# Patient Record
Sex: Male | Born: 1965 | Race: Black or African American | Hispanic: No | Marital: Married | Smoking: Never smoker
Health system: Southern US, Community
[De-identification: ages and names within clinical notes are randomized; demographics above are authoritative.]

## PROBLEM LIST (undated history)

## (undated) DIAGNOSIS — I739 Peripheral vascular disease, unspecified: Secondary | ICD-10-CM

## (undated) DIAGNOSIS — IMO0001 Reserved for inherently not codable concepts without codable children: Secondary | ICD-10-CM

## (undated) DIAGNOSIS — I499 Cardiac arrhythmia, unspecified: Secondary | ICD-10-CM

## (undated) DIAGNOSIS — F32A Depression, unspecified: Secondary | ICD-10-CM

## (undated) DIAGNOSIS — D649 Anemia, unspecified: Secondary | ICD-10-CM

## (undated) DIAGNOSIS — E039 Hypothyroidism, unspecified: Secondary | ICD-10-CM

## (undated) DIAGNOSIS — K429 Umbilical hernia without obstruction or gangrene: Secondary | ICD-10-CM

## (undated) DIAGNOSIS — Z992 Dependence on renal dialysis: Secondary | ICD-10-CM

## (undated) DIAGNOSIS — F329 Major depressive disorder, single episode, unspecified: Secondary | ICD-10-CM

## (undated) DIAGNOSIS — G4730 Sleep apnea, unspecified: Secondary | ICD-10-CM

## (undated) DIAGNOSIS — I10 Essential (primary) hypertension: Secondary | ICD-10-CM

## (undated) DIAGNOSIS — N289 Disorder of kidney and ureter, unspecified: Secondary | ICD-10-CM

## (undated) DIAGNOSIS — J45909 Unspecified asthma, uncomplicated: Secondary | ICD-10-CM

## (undated) DIAGNOSIS — G629 Polyneuropathy, unspecified: Secondary | ICD-10-CM

## (undated) DIAGNOSIS — I219 Acute myocardial infarction, unspecified: Secondary | ICD-10-CM

## (undated) HISTORY — PX: HERNIA REPAIR: SHX51

## (undated) HISTORY — PX: INSERTION OF DIALYSIS CATHETER: SHX1324

---

## 2014-12-06 DIAGNOSIS — N2581 Secondary hyperparathyroidism of renal origin: Secondary | ICD-10-CM | POA: Diagnosis not present

## 2014-12-06 DIAGNOSIS — N186 End stage renal disease: Secondary | ICD-10-CM | POA: Diagnosis not present

## 2014-12-06 DIAGNOSIS — D509 Iron deficiency anemia, unspecified: Secondary | ICD-10-CM | POA: Diagnosis not present

## 2014-12-06 DIAGNOSIS — Z992 Dependence on renal dialysis: Secondary | ICD-10-CM | POA: Diagnosis not present

## 2014-12-07 DIAGNOSIS — N2581 Secondary hyperparathyroidism of renal origin: Secondary | ICD-10-CM | POA: Diagnosis not present

## 2014-12-07 DIAGNOSIS — N186 End stage renal disease: Secondary | ICD-10-CM | POA: Diagnosis not present

## 2014-12-07 DIAGNOSIS — Z992 Dependence on renal dialysis: Secondary | ICD-10-CM | POA: Diagnosis not present

## 2014-12-07 DIAGNOSIS — D509 Iron deficiency anemia, unspecified: Secondary | ICD-10-CM | POA: Diagnosis not present

## 2014-12-08 DIAGNOSIS — Z992 Dependence on renal dialysis: Secondary | ICD-10-CM | POA: Diagnosis not present

## 2014-12-08 DIAGNOSIS — N186 End stage renal disease: Secondary | ICD-10-CM | POA: Diagnosis not present

## 2014-12-08 DIAGNOSIS — N2581 Secondary hyperparathyroidism of renal origin: Secondary | ICD-10-CM | POA: Diagnosis not present

## 2014-12-08 DIAGNOSIS — D509 Iron deficiency anemia, unspecified: Secondary | ICD-10-CM | POA: Diagnosis not present

## 2014-12-09 DIAGNOSIS — N2581 Secondary hyperparathyroidism of renal origin: Secondary | ICD-10-CM | POA: Diagnosis not present

## 2014-12-09 DIAGNOSIS — Z992 Dependence on renal dialysis: Secondary | ICD-10-CM | POA: Diagnosis not present

## 2014-12-09 DIAGNOSIS — N186 End stage renal disease: Secondary | ICD-10-CM | POA: Diagnosis not present

## 2014-12-09 DIAGNOSIS — D509 Iron deficiency anemia, unspecified: Secondary | ICD-10-CM | POA: Diagnosis not present

## 2014-12-10 DIAGNOSIS — N2581 Secondary hyperparathyroidism of renal origin: Secondary | ICD-10-CM | POA: Diagnosis not present

## 2014-12-10 DIAGNOSIS — Z992 Dependence on renal dialysis: Secondary | ICD-10-CM | POA: Diagnosis not present

## 2014-12-10 DIAGNOSIS — D509 Iron deficiency anemia, unspecified: Secondary | ICD-10-CM | POA: Diagnosis not present

## 2014-12-10 DIAGNOSIS — N186 End stage renal disease: Secondary | ICD-10-CM | POA: Diagnosis not present

## 2014-12-11 DIAGNOSIS — N2581 Secondary hyperparathyroidism of renal origin: Secondary | ICD-10-CM | POA: Diagnosis not present

## 2014-12-11 DIAGNOSIS — Z992 Dependence on renal dialysis: Secondary | ICD-10-CM | POA: Diagnosis not present

## 2014-12-11 DIAGNOSIS — D509 Iron deficiency anemia, unspecified: Secondary | ICD-10-CM | POA: Diagnosis not present

## 2014-12-11 DIAGNOSIS — N186 End stage renal disease: Secondary | ICD-10-CM | POA: Diagnosis not present

## 2014-12-12 DIAGNOSIS — Z992 Dependence on renal dialysis: Secondary | ICD-10-CM | POA: Diagnosis not present

## 2014-12-12 DIAGNOSIS — N186 End stage renal disease: Secondary | ICD-10-CM | POA: Diagnosis not present

## 2014-12-12 DIAGNOSIS — N2581 Secondary hyperparathyroidism of renal origin: Secondary | ICD-10-CM | POA: Diagnosis not present

## 2014-12-12 DIAGNOSIS — D509 Iron deficiency anemia, unspecified: Secondary | ICD-10-CM | POA: Diagnosis not present

## 2014-12-13 DIAGNOSIS — N2581 Secondary hyperparathyroidism of renal origin: Secondary | ICD-10-CM | POA: Diagnosis not present

## 2014-12-13 DIAGNOSIS — D509 Iron deficiency anemia, unspecified: Secondary | ICD-10-CM | POA: Diagnosis not present

## 2014-12-13 DIAGNOSIS — Z992 Dependence on renal dialysis: Secondary | ICD-10-CM | POA: Diagnosis not present

## 2014-12-13 DIAGNOSIS — N186 End stage renal disease: Secondary | ICD-10-CM | POA: Diagnosis not present

## 2014-12-14 DIAGNOSIS — N186 End stage renal disease: Secondary | ICD-10-CM | POA: Diagnosis not present

## 2014-12-14 DIAGNOSIS — D509 Iron deficiency anemia, unspecified: Secondary | ICD-10-CM | POA: Diagnosis not present

## 2014-12-14 DIAGNOSIS — N2581 Secondary hyperparathyroidism of renal origin: Secondary | ICD-10-CM | POA: Diagnosis not present

## 2014-12-14 DIAGNOSIS — Z992 Dependence on renal dialysis: Secondary | ICD-10-CM | POA: Diagnosis not present

## 2014-12-15 DIAGNOSIS — N186 End stage renal disease: Secondary | ICD-10-CM | POA: Diagnosis not present

## 2014-12-15 DIAGNOSIS — Z992 Dependence on renal dialysis: Secondary | ICD-10-CM | POA: Diagnosis not present

## 2014-12-15 DIAGNOSIS — N2581 Secondary hyperparathyroidism of renal origin: Secondary | ICD-10-CM | POA: Diagnosis not present

## 2014-12-15 DIAGNOSIS — D509 Iron deficiency anemia, unspecified: Secondary | ICD-10-CM | POA: Diagnosis not present

## 2014-12-16 DIAGNOSIS — Z992 Dependence on renal dialysis: Secondary | ICD-10-CM | POA: Diagnosis not present

## 2014-12-16 DIAGNOSIS — N186 End stage renal disease: Secondary | ICD-10-CM | POA: Diagnosis not present

## 2014-12-16 DIAGNOSIS — N2581 Secondary hyperparathyroidism of renal origin: Secondary | ICD-10-CM | POA: Diagnosis not present

## 2014-12-16 DIAGNOSIS — D509 Iron deficiency anemia, unspecified: Secondary | ICD-10-CM | POA: Diagnosis not present

## 2014-12-17 DIAGNOSIS — N2581 Secondary hyperparathyroidism of renal origin: Secondary | ICD-10-CM | POA: Diagnosis not present

## 2014-12-17 DIAGNOSIS — N186 End stage renal disease: Secondary | ICD-10-CM | POA: Diagnosis not present

## 2014-12-17 DIAGNOSIS — D509 Iron deficiency anemia, unspecified: Secondary | ICD-10-CM | POA: Diagnosis not present

## 2014-12-17 DIAGNOSIS — Z992 Dependence on renal dialysis: Secondary | ICD-10-CM | POA: Diagnosis not present

## 2014-12-18 DIAGNOSIS — N2581 Secondary hyperparathyroidism of renal origin: Secondary | ICD-10-CM | POA: Diagnosis not present

## 2014-12-18 DIAGNOSIS — Z992 Dependence on renal dialysis: Secondary | ICD-10-CM | POA: Diagnosis not present

## 2014-12-18 DIAGNOSIS — N186 End stage renal disease: Secondary | ICD-10-CM | POA: Diagnosis not present

## 2014-12-18 DIAGNOSIS — D509 Iron deficiency anemia, unspecified: Secondary | ICD-10-CM | POA: Diagnosis not present

## 2014-12-19 DIAGNOSIS — N186 End stage renal disease: Secondary | ICD-10-CM | POA: Diagnosis not present

## 2014-12-19 DIAGNOSIS — N2581 Secondary hyperparathyroidism of renal origin: Secondary | ICD-10-CM | POA: Diagnosis not present

## 2014-12-19 DIAGNOSIS — D509 Iron deficiency anemia, unspecified: Secondary | ICD-10-CM | POA: Diagnosis not present

## 2014-12-19 DIAGNOSIS — Z992 Dependence on renal dialysis: Secondary | ICD-10-CM | POA: Diagnosis not present

## 2014-12-20 DIAGNOSIS — Z992 Dependence on renal dialysis: Secondary | ICD-10-CM | POA: Diagnosis not present

## 2014-12-20 DIAGNOSIS — D509 Iron deficiency anemia, unspecified: Secondary | ICD-10-CM | POA: Diagnosis not present

## 2014-12-20 DIAGNOSIS — N186 End stage renal disease: Secondary | ICD-10-CM | POA: Diagnosis not present

## 2014-12-20 DIAGNOSIS — N2581 Secondary hyperparathyroidism of renal origin: Secondary | ICD-10-CM | POA: Diagnosis not present

## 2014-12-21 DIAGNOSIS — N186 End stage renal disease: Secondary | ICD-10-CM | POA: Diagnosis not present

## 2014-12-21 DIAGNOSIS — D509 Iron deficiency anemia, unspecified: Secondary | ICD-10-CM | POA: Diagnosis not present

## 2014-12-21 DIAGNOSIS — Z992 Dependence on renal dialysis: Secondary | ICD-10-CM | POA: Diagnosis not present

## 2014-12-21 DIAGNOSIS — N2581 Secondary hyperparathyroidism of renal origin: Secondary | ICD-10-CM | POA: Diagnosis not present

## 2014-12-22 DIAGNOSIS — Z992 Dependence on renal dialysis: Secondary | ICD-10-CM | POA: Diagnosis not present

## 2014-12-22 DIAGNOSIS — N186 End stage renal disease: Secondary | ICD-10-CM | POA: Diagnosis not present

## 2014-12-22 DIAGNOSIS — D509 Iron deficiency anemia, unspecified: Secondary | ICD-10-CM | POA: Diagnosis not present

## 2014-12-22 DIAGNOSIS — N2581 Secondary hyperparathyroidism of renal origin: Secondary | ICD-10-CM | POA: Diagnosis not present

## 2014-12-23 DIAGNOSIS — Z992 Dependence on renal dialysis: Secondary | ICD-10-CM | POA: Diagnosis not present

## 2014-12-23 DIAGNOSIS — N2581 Secondary hyperparathyroidism of renal origin: Secondary | ICD-10-CM | POA: Diagnosis not present

## 2014-12-23 DIAGNOSIS — D509 Iron deficiency anemia, unspecified: Secondary | ICD-10-CM | POA: Diagnosis not present

## 2014-12-23 DIAGNOSIS — N186 End stage renal disease: Secondary | ICD-10-CM | POA: Diagnosis not present

## 2014-12-24 DIAGNOSIS — Z992 Dependence on renal dialysis: Secondary | ICD-10-CM | POA: Diagnosis not present

## 2014-12-24 DIAGNOSIS — D509 Iron deficiency anemia, unspecified: Secondary | ICD-10-CM | POA: Diagnosis not present

## 2014-12-24 DIAGNOSIS — N2581 Secondary hyperparathyroidism of renal origin: Secondary | ICD-10-CM | POA: Diagnosis not present

## 2014-12-24 DIAGNOSIS — N186 End stage renal disease: Secondary | ICD-10-CM | POA: Diagnosis not present

## 2014-12-25 DIAGNOSIS — D509 Iron deficiency anemia, unspecified: Secondary | ICD-10-CM | POA: Diagnosis not present

## 2014-12-25 DIAGNOSIS — N2581 Secondary hyperparathyroidism of renal origin: Secondary | ICD-10-CM | POA: Diagnosis not present

## 2014-12-25 DIAGNOSIS — Z992 Dependence on renal dialysis: Secondary | ICD-10-CM | POA: Diagnosis not present

## 2014-12-25 DIAGNOSIS — N186 End stage renal disease: Secondary | ICD-10-CM | POA: Diagnosis not present

## 2015-01-02 DIAGNOSIS — N2581 Secondary hyperparathyroidism of renal origin: Secondary | ICD-10-CM | POA: Diagnosis not present

## 2015-01-02 DIAGNOSIS — Z992 Dependence on renal dialysis: Secondary | ICD-10-CM | POA: Diagnosis not present

## 2015-01-02 DIAGNOSIS — D509 Iron deficiency anemia, unspecified: Secondary | ICD-10-CM | POA: Diagnosis not present

## 2015-01-02 DIAGNOSIS — N186 End stage renal disease: Secondary | ICD-10-CM | POA: Diagnosis not present

## 2015-01-03 DIAGNOSIS — N2581 Secondary hyperparathyroidism of renal origin: Secondary | ICD-10-CM | POA: Diagnosis not present

## 2015-01-03 DIAGNOSIS — D509 Iron deficiency anemia, unspecified: Secondary | ICD-10-CM | POA: Diagnosis not present

## 2015-01-03 DIAGNOSIS — N186 End stage renal disease: Secondary | ICD-10-CM | POA: Diagnosis not present

## 2015-01-03 DIAGNOSIS — Z992 Dependence on renal dialysis: Secondary | ICD-10-CM | POA: Diagnosis not present

## 2015-01-04 DIAGNOSIS — N2581 Secondary hyperparathyroidism of renal origin: Secondary | ICD-10-CM | POA: Diagnosis not present

## 2015-01-04 DIAGNOSIS — D509 Iron deficiency anemia, unspecified: Secondary | ICD-10-CM | POA: Diagnosis not present

## 2015-01-04 DIAGNOSIS — N186 End stage renal disease: Secondary | ICD-10-CM | POA: Diagnosis not present

## 2015-01-04 DIAGNOSIS — Z992 Dependence on renal dialysis: Secondary | ICD-10-CM | POA: Diagnosis not present

## 2015-01-05 DIAGNOSIS — N2581 Secondary hyperparathyroidism of renal origin: Secondary | ICD-10-CM | POA: Diagnosis not present

## 2015-01-05 DIAGNOSIS — Z992 Dependence on renal dialysis: Secondary | ICD-10-CM | POA: Diagnosis not present

## 2015-01-05 DIAGNOSIS — N186 End stage renal disease: Secondary | ICD-10-CM | POA: Diagnosis not present

## 2015-01-05 DIAGNOSIS — D509 Iron deficiency anemia, unspecified: Secondary | ICD-10-CM | POA: Diagnosis not present

## 2015-01-06 DIAGNOSIS — N2581 Secondary hyperparathyroidism of renal origin: Secondary | ICD-10-CM | POA: Diagnosis not present

## 2015-01-06 DIAGNOSIS — Z992 Dependence on renal dialysis: Secondary | ICD-10-CM | POA: Diagnosis not present

## 2015-01-06 DIAGNOSIS — N186 End stage renal disease: Secondary | ICD-10-CM | POA: Diagnosis not present

## 2015-01-06 DIAGNOSIS — D509 Iron deficiency anemia, unspecified: Secondary | ICD-10-CM | POA: Diagnosis not present

## 2015-01-07 DIAGNOSIS — D509 Iron deficiency anemia, unspecified: Secondary | ICD-10-CM | POA: Diagnosis not present

## 2015-01-07 DIAGNOSIS — Z992 Dependence on renal dialysis: Secondary | ICD-10-CM | POA: Diagnosis not present

## 2015-01-07 DIAGNOSIS — N2581 Secondary hyperparathyroidism of renal origin: Secondary | ICD-10-CM | POA: Diagnosis not present

## 2015-01-07 DIAGNOSIS — N186 End stage renal disease: Secondary | ICD-10-CM | POA: Diagnosis not present

## 2015-01-08 DIAGNOSIS — I5022 Chronic systolic (congestive) heart failure: Secondary | ICD-10-CM | POA: Diagnosis present

## 2015-01-08 DIAGNOSIS — R0602 Shortness of breath: Secondary | ICD-10-CM | POA: Diagnosis not present

## 2015-01-08 DIAGNOSIS — J449 Chronic obstructive pulmonary disease, unspecified: Secondary | ICD-10-CM | POA: Diagnosis present

## 2015-01-08 DIAGNOSIS — R0789 Other chest pain: Secondary | ICD-10-CM | POA: Diagnosis not present

## 2015-01-08 DIAGNOSIS — E875 Hyperkalemia: Secondary | ICD-10-CM | POA: Diagnosis present

## 2015-01-08 DIAGNOSIS — R7989 Other specified abnormal findings of blood chemistry: Secondary | ICD-10-CM | POA: Diagnosis present

## 2015-01-08 DIAGNOSIS — Z7982 Long term (current) use of aspirin: Secondary | ICD-10-CM | POA: Diagnosis not present

## 2015-01-08 DIAGNOSIS — R000 Tachycardia, unspecified: Secondary | ICD-10-CM | POA: Diagnosis not present

## 2015-01-08 DIAGNOSIS — E785 Hyperlipidemia, unspecified: Secondary | ICD-10-CM | POA: Diagnosis present

## 2015-01-08 DIAGNOSIS — F329 Major depressive disorder, single episode, unspecified: Secondary | ICD-10-CM | POA: Diagnosis present

## 2015-01-08 DIAGNOSIS — I120 Hypertensive chronic kidney disease with stage 5 chronic kidney disease or end stage renal disease: Secondary | ICD-10-CM | POA: Diagnosis not present

## 2015-01-08 DIAGNOSIS — J9601 Acute respiratory failure with hypoxia: Secondary | ICD-10-CM | POA: Diagnosis not present

## 2015-01-08 DIAGNOSIS — R079 Chest pain, unspecified: Secondary | ICD-10-CM | POA: Diagnosis not present

## 2015-01-08 DIAGNOSIS — E8779 Other fluid overload: Secondary | ICD-10-CM | POA: Diagnosis not present

## 2015-01-08 DIAGNOSIS — Z992 Dependence on renal dialysis: Secondary | ICD-10-CM | POA: Diagnosis not present

## 2015-01-08 DIAGNOSIS — N186 End stage renal disease: Secondary | ICD-10-CM | POA: Diagnosis not present

## 2015-01-08 DIAGNOSIS — Z9115 Patient's noncompliance with renal dialysis: Secondary | ICD-10-CM | POA: Diagnosis not present

## 2015-01-08 DIAGNOSIS — D638 Anemia in other chronic diseases classified elsewhere: Secondary | ICD-10-CM | POA: Diagnosis not present

## 2015-01-08 DIAGNOSIS — R1084 Generalized abdominal pain: Secondary | ICD-10-CM | POA: Diagnosis present

## 2015-01-08 DIAGNOSIS — J90 Pleural effusion, not elsewhere classified: Secondary | ICD-10-CM | POA: Diagnosis not present

## 2015-01-08 DIAGNOSIS — J811 Chronic pulmonary edema: Secondary | ICD-10-CM | POA: Diagnosis not present

## 2015-01-08 DIAGNOSIS — I2510 Atherosclerotic heart disease of native coronary artery without angina pectoris: Secondary | ICD-10-CM | POA: Diagnosis present

## 2015-01-08 DIAGNOSIS — I517 Cardiomegaly: Secondary | ICD-10-CM | POA: Diagnosis not present

## 2015-01-11 DIAGNOSIS — N2581 Secondary hyperparathyroidism of renal origin: Secondary | ICD-10-CM | POA: Diagnosis not present

## 2015-01-11 DIAGNOSIS — Z992 Dependence on renal dialysis: Secondary | ICD-10-CM | POA: Diagnosis not present

## 2015-01-11 DIAGNOSIS — N186 End stage renal disease: Secondary | ICD-10-CM | POA: Diagnosis not present

## 2015-01-11 DIAGNOSIS — D509 Iron deficiency anemia, unspecified: Secondary | ICD-10-CM | POA: Diagnosis not present

## 2015-01-12 DIAGNOSIS — D509 Iron deficiency anemia, unspecified: Secondary | ICD-10-CM | POA: Diagnosis not present

## 2015-01-12 DIAGNOSIS — N2581 Secondary hyperparathyroidism of renal origin: Secondary | ICD-10-CM | POA: Diagnosis not present

## 2015-01-12 DIAGNOSIS — N186 End stage renal disease: Secondary | ICD-10-CM | POA: Diagnosis not present

## 2015-01-12 DIAGNOSIS — Z992 Dependence on renal dialysis: Secondary | ICD-10-CM | POA: Diagnosis not present

## 2015-01-13 DIAGNOSIS — Z992 Dependence on renal dialysis: Secondary | ICD-10-CM | POA: Diagnosis not present

## 2015-01-13 DIAGNOSIS — N2581 Secondary hyperparathyroidism of renal origin: Secondary | ICD-10-CM | POA: Diagnosis not present

## 2015-01-13 DIAGNOSIS — N186 End stage renal disease: Secondary | ICD-10-CM | POA: Diagnosis not present

## 2015-01-13 DIAGNOSIS — D509 Iron deficiency anemia, unspecified: Secondary | ICD-10-CM | POA: Diagnosis not present

## 2015-01-14 DIAGNOSIS — D509 Iron deficiency anemia, unspecified: Secondary | ICD-10-CM | POA: Diagnosis not present

## 2015-01-14 DIAGNOSIS — N186 End stage renal disease: Secondary | ICD-10-CM | POA: Diagnosis not present

## 2015-01-14 DIAGNOSIS — N2581 Secondary hyperparathyroidism of renal origin: Secondary | ICD-10-CM | POA: Diagnosis not present

## 2015-01-14 DIAGNOSIS — Z992 Dependence on renal dialysis: Secondary | ICD-10-CM | POA: Diagnosis not present

## 2015-01-15 DIAGNOSIS — Z992 Dependence on renal dialysis: Secondary | ICD-10-CM | POA: Diagnosis not present

## 2015-01-15 DIAGNOSIS — N186 End stage renal disease: Secondary | ICD-10-CM | POA: Diagnosis not present

## 2015-01-15 DIAGNOSIS — N2581 Secondary hyperparathyroidism of renal origin: Secondary | ICD-10-CM | POA: Diagnosis not present

## 2015-01-15 DIAGNOSIS — D509 Iron deficiency anemia, unspecified: Secondary | ICD-10-CM | POA: Diagnosis not present

## 2015-01-16 DIAGNOSIS — N2581 Secondary hyperparathyroidism of renal origin: Secondary | ICD-10-CM | POA: Diagnosis not present

## 2015-01-16 DIAGNOSIS — D509 Iron deficiency anemia, unspecified: Secondary | ICD-10-CM | POA: Diagnosis not present

## 2015-01-16 DIAGNOSIS — N186 End stage renal disease: Secondary | ICD-10-CM | POA: Diagnosis not present

## 2015-01-16 DIAGNOSIS — Z992 Dependence on renal dialysis: Secondary | ICD-10-CM | POA: Diagnosis not present

## 2015-01-17 DIAGNOSIS — N2581 Secondary hyperparathyroidism of renal origin: Secondary | ICD-10-CM | POA: Diagnosis not present

## 2015-01-17 DIAGNOSIS — Z992 Dependence on renal dialysis: Secondary | ICD-10-CM | POA: Diagnosis not present

## 2015-01-17 DIAGNOSIS — D509 Iron deficiency anemia, unspecified: Secondary | ICD-10-CM | POA: Diagnosis not present

## 2015-01-17 DIAGNOSIS — N186 End stage renal disease: Secondary | ICD-10-CM | POA: Diagnosis not present

## 2015-01-18 DIAGNOSIS — Z992 Dependence on renal dialysis: Secondary | ICD-10-CM | POA: Diagnosis not present

## 2015-01-18 DIAGNOSIS — N2581 Secondary hyperparathyroidism of renal origin: Secondary | ICD-10-CM | POA: Diagnosis not present

## 2015-01-18 DIAGNOSIS — N186 End stage renal disease: Secondary | ICD-10-CM | POA: Diagnosis not present

## 2015-01-18 DIAGNOSIS — D509 Iron deficiency anemia, unspecified: Secondary | ICD-10-CM | POA: Diagnosis not present

## 2015-01-19 DIAGNOSIS — D509 Iron deficiency anemia, unspecified: Secondary | ICD-10-CM | POA: Diagnosis not present

## 2015-01-19 DIAGNOSIS — N2581 Secondary hyperparathyroidism of renal origin: Secondary | ICD-10-CM | POA: Diagnosis not present

## 2015-01-19 DIAGNOSIS — N186 End stage renal disease: Secondary | ICD-10-CM | POA: Diagnosis not present

## 2015-01-19 DIAGNOSIS — Z992 Dependence on renal dialysis: Secondary | ICD-10-CM | POA: Diagnosis not present

## 2015-01-20 DIAGNOSIS — D509 Iron deficiency anemia, unspecified: Secondary | ICD-10-CM | POA: Diagnosis not present

## 2015-01-20 DIAGNOSIS — Z992 Dependence on renal dialysis: Secondary | ICD-10-CM | POA: Diagnosis not present

## 2015-01-20 DIAGNOSIS — N186 End stage renal disease: Secondary | ICD-10-CM | POA: Diagnosis not present

## 2015-01-20 DIAGNOSIS — N2581 Secondary hyperparathyroidism of renal origin: Secondary | ICD-10-CM | POA: Diagnosis not present

## 2015-01-21 DIAGNOSIS — N186 End stage renal disease: Secondary | ICD-10-CM | POA: Diagnosis not present

## 2015-01-21 DIAGNOSIS — Z992 Dependence on renal dialysis: Secondary | ICD-10-CM | POA: Diagnosis not present

## 2015-01-21 DIAGNOSIS — N2581 Secondary hyperparathyroidism of renal origin: Secondary | ICD-10-CM | POA: Diagnosis not present

## 2015-01-21 DIAGNOSIS — D509 Iron deficiency anemia, unspecified: Secondary | ICD-10-CM | POA: Diagnosis not present

## 2015-01-22 DIAGNOSIS — N2581 Secondary hyperparathyroidism of renal origin: Secondary | ICD-10-CM | POA: Diagnosis not present

## 2015-01-22 DIAGNOSIS — Z992 Dependence on renal dialysis: Secondary | ICD-10-CM | POA: Diagnosis not present

## 2015-01-22 DIAGNOSIS — N186 End stage renal disease: Secondary | ICD-10-CM | POA: Diagnosis not present

## 2015-01-22 DIAGNOSIS — D509 Iron deficiency anemia, unspecified: Secondary | ICD-10-CM | POA: Diagnosis not present

## 2015-01-23 DIAGNOSIS — N186 End stage renal disease: Secondary | ICD-10-CM | POA: Diagnosis not present

## 2015-01-23 DIAGNOSIS — Z992 Dependence on renal dialysis: Secondary | ICD-10-CM | POA: Diagnosis not present

## 2015-01-23 DIAGNOSIS — D509 Iron deficiency anemia, unspecified: Secondary | ICD-10-CM | POA: Diagnosis not present

## 2015-01-23 DIAGNOSIS — N2581 Secondary hyperparathyroidism of renal origin: Secondary | ICD-10-CM | POA: Diagnosis not present

## 2015-01-24 DIAGNOSIS — N2581 Secondary hyperparathyroidism of renal origin: Secondary | ICD-10-CM | POA: Diagnosis not present

## 2015-01-24 DIAGNOSIS — N186 End stage renal disease: Secondary | ICD-10-CM | POA: Diagnosis not present

## 2015-01-24 DIAGNOSIS — Z992 Dependence on renal dialysis: Secondary | ICD-10-CM | POA: Diagnosis not present

## 2015-01-24 DIAGNOSIS — D509 Iron deficiency anemia, unspecified: Secondary | ICD-10-CM | POA: Diagnosis not present

## 2015-01-25 DIAGNOSIS — Z992 Dependence on renal dialysis: Secondary | ICD-10-CM | POA: Diagnosis not present

## 2015-01-25 DIAGNOSIS — N186 End stage renal disease: Secondary | ICD-10-CM | POA: Diagnosis not present

## 2015-01-25 DIAGNOSIS — N2581 Secondary hyperparathyroidism of renal origin: Secondary | ICD-10-CM | POA: Diagnosis not present

## 2015-01-25 DIAGNOSIS — D509 Iron deficiency anemia, unspecified: Secondary | ICD-10-CM | POA: Diagnosis not present

## 2015-01-26 DIAGNOSIS — N2581 Secondary hyperparathyroidism of renal origin: Secondary | ICD-10-CM | POA: Diagnosis not present

## 2015-01-26 DIAGNOSIS — Z992 Dependence on renal dialysis: Secondary | ICD-10-CM | POA: Diagnosis not present

## 2015-01-26 DIAGNOSIS — D509 Iron deficiency anemia, unspecified: Secondary | ICD-10-CM | POA: Diagnosis not present

## 2015-01-26 DIAGNOSIS — N186 End stage renal disease: Secondary | ICD-10-CM | POA: Diagnosis not present

## 2015-01-27 DIAGNOSIS — Z992 Dependence on renal dialysis: Secondary | ICD-10-CM | POA: Diagnosis not present

## 2015-01-27 DIAGNOSIS — N2581 Secondary hyperparathyroidism of renal origin: Secondary | ICD-10-CM | POA: Diagnosis not present

## 2015-01-27 DIAGNOSIS — N186 End stage renal disease: Secondary | ICD-10-CM | POA: Diagnosis not present

## 2015-01-27 DIAGNOSIS — D509 Iron deficiency anemia, unspecified: Secondary | ICD-10-CM | POA: Diagnosis not present

## 2015-01-28 DIAGNOSIS — N186 End stage renal disease: Secondary | ICD-10-CM | POA: Diagnosis not present

## 2015-01-28 DIAGNOSIS — D509 Iron deficiency anemia, unspecified: Secondary | ICD-10-CM | POA: Diagnosis not present

## 2015-01-28 DIAGNOSIS — Z992 Dependence on renal dialysis: Secondary | ICD-10-CM | POA: Diagnosis not present

## 2015-01-28 DIAGNOSIS — N2581 Secondary hyperparathyroidism of renal origin: Secondary | ICD-10-CM | POA: Diagnosis not present

## 2015-01-29 DIAGNOSIS — D509 Iron deficiency anemia, unspecified: Secondary | ICD-10-CM | POA: Diagnosis not present

## 2015-01-29 DIAGNOSIS — N186 End stage renal disease: Secondary | ICD-10-CM | POA: Diagnosis not present

## 2015-01-29 DIAGNOSIS — N2581 Secondary hyperparathyroidism of renal origin: Secondary | ICD-10-CM | POA: Diagnosis not present

## 2015-01-29 DIAGNOSIS — Z992 Dependence on renal dialysis: Secondary | ICD-10-CM | POA: Diagnosis not present

## 2015-01-30 DIAGNOSIS — N2581 Secondary hyperparathyroidism of renal origin: Secondary | ICD-10-CM | POA: Diagnosis not present

## 2015-01-30 DIAGNOSIS — D509 Iron deficiency anemia, unspecified: Secondary | ICD-10-CM | POA: Diagnosis not present

## 2015-01-30 DIAGNOSIS — Z992 Dependence on renal dialysis: Secondary | ICD-10-CM | POA: Diagnosis not present

## 2015-01-30 DIAGNOSIS — N186 End stage renal disease: Secondary | ICD-10-CM | POA: Diagnosis not present

## 2015-01-31 DIAGNOSIS — N2581 Secondary hyperparathyroidism of renal origin: Secondary | ICD-10-CM | POA: Diagnosis not present

## 2015-01-31 DIAGNOSIS — N186 End stage renal disease: Secondary | ICD-10-CM | POA: Diagnosis not present

## 2015-01-31 DIAGNOSIS — Z992 Dependence on renal dialysis: Secondary | ICD-10-CM | POA: Diagnosis not present

## 2015-01-31 DIAGNOSIS — D509 Iron deficiency anemia, unspecified: Secondary | ICD-10-CM | POA: Diagnosis not present

## 2015-02-01 DIAGNOSIS — N186 End stage renal disease: Secondary | ICD-10-CM | POA: Diagnosis not present

## 2015-02-01 DIAGNOSIS — Z992 Dependence on renal dialysis: Secondary | ICD-10-CM | POA: Diagnosis not present

## 2015-02-01 DIAGNOSIS — D509 Iron deficiency anemia, unspecified: Secondary | ICD-10-CM | POA: Diagnosis not present

## 2015-02-01 DIAGNOSIS — N2581 Secondary hyperparathyroidism of renal origin: Secondary | ICD-10-CM | POA: Diagnosis not present

## 2015-02-02 DIAGNOSIS — D509 Iron deficiency anemia, unspecified: Secondary | ICD-10-CM | POA: Diagnosis not present

## 2015-02-02 DIAGNOSIS — N186 End stage renal disease: Secondary | ICD-10-CM | POA: Diagnosis not present

## 2015-02-02 DIAGNOSIS — Z992 Dependence on renal dialysis: Secondary | ICD-10-CM | POA: Diagnosis not present

## 2015-02-02 DIAGNOSIS — N2581 Secondary hyperparathyroidism of renal origin: Secondary | ICD-10-CM | POA: Diagnosis not present

## 2015-02-03 DIAGNOSIS — Z992 Dependence on renal dialysis: Secondary | ICD-10-CM | POA: Diagnosis not present

## 2015-02-03 DIAGNOSIS — D509 Iron deficiency anemia, unspecified: Secondary | ICD-10-CM | POA: Diagnosis not present

## 2015-02-03 DIAGNOSIS — N186 End stage renal disease: Secondary | ICD-10-CM | POA: Diagnosis not present

## 2015-02-03 DIAGNOSIS — N2581 Secondary hyperparathyroidism of renal origin: Secondary | ICD-10-CM | POA: Diagnosis not present

## 2015-02-04 DIAGNOSIS — D509 Iron deficiency anemia, unspecified: Secondary | ICD-10-CM | POA: Diagnosis not present

## 2015-02-04 DIAGNOSIS — N186 End stage renal disease: Secondary | ICD-10-CM | POA: Diagnosis not present

## 2015-02-04 DIAGNOSIS — Z992 Dependence on renal dialysis: Secondary | ICD-10-CM | POA: Diagnosis not present

## 2015-02-05 DIAGNOSIS — N186 End stage renal disease: Secondary | ICD-10-CM | POA: Diagnosis not present

## 2015-02-05 DIAGNOSIS — D509 Iron deficiency anemia, unspecified: Secondary | ICD-10-CM | POA: Diagnosis not present

## 2015-02-05 DIAGNOSIS — Z992 Dependence on renal dialysis: Secondary | ICD-10-CM | POA: Diagnosis not present

## 2015-02-06 DIAGNOSIS — Z992 Dependence on renal dialysis: Secondary | ICD-10-CM | POA: Diagnosis not present

## 2015-02-06 DIAGNOSIS — N186 End stage renal disease: Secondary | ICD-10-CM | POA: Diagnosis not present

## 2015-02-06 DIAGNOSIS — D509 Iron deficiency anemia, unspecified: Secondary | ICD-10-CM | POA: Diagnosis not present

## 2015-02-07 DIAGNOSIS — N186 End stage renal disease: Secondary | ICD-10-CM | POA: Diagnosis not present

## 2015-02-07 DIAGNOSIS — D509 Iron deficiency anemia, unspecified: Secondary | ICD-10-CM | POA: Diagnosis not present

## 2015-02-07 DIAGNOSIS — Z992 Dependence on renal dialysis: Secondary | ICD-10-CM | POA: Diagnosis not present

## 2015-02-08 DIAGNOSIS — D509 Iron deficiency anemia, unspecified: Secondary | ICD-10-CM | POA: Diagnosis not present

## 2015-02-08 DIAGNOSIS — N186 End stage renal disease: Secondary | ICD-10-CM | POA: Diagnosis not present

## 2015-02-08 DIAGNOSIS — Z992 Dependence on renal dialysis: Secondary | ICD-10-CM | POA: Diagnosis not present

## 2015-02-09 DIAGNOSIS — N186 End stage renal disease: Secondary | ICD-10-CM | POA: Diagnosis not present

## 2015-02-09 DIAGNOSIS — Z992 Dependence on renal dialysis: Secondary | ICD-10-CM | POA: Diagnosis not present

## 2015-02-09 DIAGNOSIS — D509 Iron deficiency anemia, unspecified: Secondary | ICD-10-CM | POA: Diagnosis not present

## 2015-02-10 DIAGNOSIS — Z992 Dependence on renal dialysis: Secondary | ICD-10-CM | POA: Diagnosis not present

## 2015-02-10 DIAGNOSIS — N186 End stage renal disease: Secondary | ICD-10-CM | POA: Diagnosis not present

## 2015-02-10 DIAGNOSIS — D509 Iron deficiency anemia, unspecified: Secondary | ICD-10-CM | POA: Diagnosis not present

## 2015-02-11 DIAGNOSIS — N186 End stage renal disease: Secondary | ICD-10-CM | POA: Diagnosis not present

## 2015-02-11 DIAGNOSIS — D509 Iron deficiency anemia, unspecified: Secondary | ICD-10-CM | POA: Diagnosis not present

## 2015-02-11 DIAGNOSIS — Z992 Dependence on renal dialysis: Secondary | ICD-10-CM | POA: Diagnosis not present

## 2015-02-12 DIAGNOSIS — Z992 Dependence on renal dialysis: Secondary | ICD-10-CM | POA: Diagnosis not present

## 2015-02-12 DIAGNOSIS — N186 End stage renal disease: Secondary | ICD-10-CM | POA: Diagnosis not present

## 2015-02-12 DIAGNOSIS — D509 Iron deficiency anemia, unspecified: Secondary | ICD-10-CM | POA: Diagnosis not present

## 2015-02-13 DIAGNOSIS — Z992 Dependence on renal dialysis: Secondary | ICD-10-CM | POA: Diagnosis not present

## 2015-02-13 DIAGNOSIS — N186 End stage renal disease: Secondary | ICD-10-CM | POA: Diagnosis not present

## 2015-02-13 DIAGNOSIS — D509 Iron deficiency anemia, unspecified: Secondary | ICD-10-CM | POA: Diagnosis not present

## 2015-02-14 DIAGNOSIS — D509 Iron deficiency anemia, unspecified: Secondary | ICD-10-CM | POA: Diagnosis not present

## 2015-02-14 DIAGNOSIS — Z992 Dependence on renal dialysis: Secondary | ICD-10-CM | POA: Diagnosis not present

## 2015-02-14 DIAGNOSIS — N186 End stage renal disease: Secondary | ICD-10-CM | POA: Diagnosis not present

## 2015-02-15 DIAGNOSIS — D509 Iron deficiency anemia, unspecified: Secondary | ICD-10-CM | POA: Diagnosis not present

## 2015-02-15 DIAGNOSIS — Z992 Dependence on renal dialysis: Secondary | ICD-10-CM | POA: Diagnosis not present

## 2015-02-15 DIAGNOSIS — N186 End stage renal disease: Secondary | ICD-10-CM | POA: Diagnosis not present

## 2015-02-16 DIAGNOSIS — N186 End stage renal disease: Secondary | ICD-10-CM | POA: Diagnosis not present

## 2015-02-16 DIAGNOSIS — D509 Iron deficiency anemia, unspecified: Secondary | ICD-10-CM | POA: Diagnosis not present

## 2015-02-16 DIAGNOSIS — Z992 Dependence on renal dialysis: Secondary | ICD-10-CM | POA: Diagnosis not present

## 2015-02-17 DIAGNOSIS — D509 Iron deficiency anemia, unspecified: Secondary | ICD-10-CM | POA: Diagnosis not present

## 2015-02-17 DIAGNOSIS — N186 End stage renal disease: Secondary | ICD-10-CM | POA: Diagnosis not present

## 2015-02-17 DIAGNOSIS — Z992 Dependence on renal dialysis: Secondary | ICD-10-CM | POA: Diagnosis not present

## 2015-02-18 DIAGNOSIS — D509 Iron deficiency anemia, unspecified: Secondary | ICD-10-CM | POA: Diagnosis not present

## 2015-02-18 DIAGNOSIS — Z992 Dependence on renal dialysis: Secondary | ICD-10-CM | POA: Diagnosis not present

## 2015-02-18 DIAGNOSIS — N186 End stage renal disease: Secondary | ICD-10-CM | POA: Diagnosis not present

## 2015-02-19 DIAGNOSIS — D509 Iron deficiency anemia, unspecified: Secondary | ICD-10-CM | POA: Diagnosis not present

## 2015-02-19 DIAGNOSIS — N186 End stage renal disease: Secondary | ICD-10-CM | POA: Diagnosis not present

## 2015-02-19 DIAGNOSIS — Z992 Dependence on renal dialysis: Secondary | ICD-10-CM | POA: Diagnosis not present

## 2015-02-20 DIAGNOSIS — Z992 Dependence on renal dialysis: Secondary | ICD-10-CM | POA: Diagnosis not present

## 2015-02-20 DIAGNOSIS — D509 Iron deficiency anemia, unspecified: Secondary | ICD-10-CM | POA: Diagnosis not present

## 2015-02-20 DIAGNOSIS — N186 End stage renal disease: Secondary | ICD-10-CM | POA: Diagnosis not present

## 2015-02-21 DIAGNOSIS — D509 Iron deficiency anemia, unspecified: Secondary | ICD-10-CM | POA: Diagnosis not present

## 2015-02-21 DIAGNOSIS — Z992 Dependence on renal dialysis: Secondary | ICD-10-CM | POA: Diagnosis not present

## 2015-02-21 DIAGNOSIS — N186 End stage renal disease: Secondary | ICD-10-CM | POA: Diagnosis not present

## 2015-02-22 DIAGNOSIS — N186 End stage renal disease: Secondary | ICD-10-CM | POA: Diagnosis not present

## 2015-02-22 DIAGNOSIS — Z992 Dependence on renal dialysis: Secondary | ICD-10-CM | POA: Diagnosis not present

## 2015-02-22 DIAGNOSIS — D509 Iron deficiency anemia, unspecified: Secondary | ICD-10-CM | POA: Diagnosis not present

## 2015-02-23 DIAGNOSIS — N186 End stage renal disease: Secondary | ICD-10-CM | POA: Diagnosis not present

## 2015-02-23 DIAGNOSIS — D509 Iron deficiency anemia, unspecified: Secondary | ICD-10-CM | POA: Diagnosis not present

## 2015-02-23 DIAGNOSIS — Z992 Dependence on renal dialysis: Secondary | ICD-10-CM | POA: Diagnosis not present

## 2015-02-24 DIAGNOSIS — D509 Iron deficiency anemia, unspecified: Secondary | ICD-10-CM | POA: Diagnosis not present

## 2015-02-24 DIAGNOSIS — N186 End stage renal disease: Secondary | ICD-10-CM | POA: Diagnosis not present

## 2015-02-24 DIAGNOSIS — Z992 Dependence on renal dialysis: Secondary | ICD-10-CM | POA: Diagnosis not present

## 2015-02-25 DIAGNOSIS — D509 Iron deficiency anemia, unspecified: Secondary | ICD-10-CM | POA: Diagnosis not present

## 2015-02-25 DIAGNOSIS — N186 End stage renal disease: Secondary | ICD-10-CM | POA: Diagnosis not present

## 2015-02-25 DIAGNOSIS — Z992 Dependence on renal dialysis: Secondary | ICD-10-CM | POA: Diagnosis not present

## 2015-02-26 DIAGNOSIS — N186 End stage renal disease: Secondary | ICD-10-CM | POA: Diagnosis not present

## 2015-02-26 DIAGNOSIS — Z992 Dependence on renal dialysis: Secondary | ICD-10-CM | POA: Diagnosis not present

## 2015-02-26 DIAGNOSIS — D509 Iron deficiency anemia, unspecified: Secondary | ICD-10-CM | POA: Diagnosis not present

## 2015-02-27 DIAGNOSIS — N186 End stage renal disease: Secondary | ICD-10-CM | POA: Diagnosis not present

## 2015-02-27 DIAGNOSIS — D509 Iron deficiency anemia, unspecified: Secondary | ICD-10-CM | POA: Diagnosis not present

## 2015-02-27 DIAGNOSIS — Z992 Dependence on renal dialysis: Secondary | ICD-10-CM | POA: Diagnosis not present

## 2015-02-28 DIAGNOSIS — N186 End stage renal disease: Secondary | ICD-10-CM | POA: Diagnosis not present

## 2015-02-28 DIAGNOSIS — D509 Iron deficiency anemia, unspecified: Secondary | ICD-10-CM | POA: Diagnosis not present

## 2015-02-28 DIAGNOSIS — Z992 Dependence on renal dialysis: Secondary | ICD-10-CM | POA: Diagnosis not present

## 2015-03-01 DIAGNOSIS — N186 End stage renal disease: Secondary | ICD-10-CM | POA: Diagnosis not present

## 2015-03-01 DIAGNOSIS — D509 Iron deficiency anemia, unspecified: Secondary | ICD-10-CM | POA: Diagnosis not present

## 2015-03-01 DIAGNOSIS — Z992 Dependence on renal dialysis: Secondary | ICD-10-CM | POA: Diagnosis not present

## 2015-03-02 DIAGNOSIS — N186 End stage renal disease: Secondary | ICD-10-CM | POA: Diagnosis not present

## 2015-03-02 DIAGNOSIS — D509 Iron deficiency anemia, unspecified: Secondary | ICD-10-CM | POA: Diagnosis not present

## 2015-03-02 DIAGNOSIS — Z992 Dependence on renal dialysis: Secondary | ICD-10-CM | POA: Diagnosis not present

## 2015-03-03 DIAGNOSIS — Z992 Dependence on renal dialysis: Secondary | ICD-10-CM | POA: Diagnosis not present

## 2015-03-03 DIAGNOSIS — N186 End stage renal disease: Secondary | ICD-10-CM | POA: Diagnosis not present

## 2015-03-03 DIAGNOSIS — D509 Iron deficiency anemia, unspecified: Secondary | ICD-10-CM | POA: Diagnosis not present

## 2015-03-04 DIAGNOSIS — Z992 Dependence on renal dialysis: Secondary | ICD-10-CM | POA: Diagnosis not present

## 2015-03-04 DIAGNOSIS — N186 End stage renal disease: Secondary | ICD-10-CM | POA: Diagnosis not present

## 2015-03-04 DIAGNOSIS — D509 Iron deficiency anemia, unspecified: Secondary | ICD-10-CM | POA: Diagnosis not present

## 2015-03-05 DIAGNOSIS — Z789 Other specified health status: Secondary | ICD-10-CM | POA: Diagnosis not present

## 2015-03-05 DIAGNOSIS — E668 Other obesity: Secondary | ICD-10-CM | POA: Diagnosis not present

## 2015-03-05 DIAGNOSIS — N19 Unspecified kidney failure: Secondary | ICD-10-CM | POA: Diagnosis not present

## 2015-03-05 DIAGNOSIS — R5383 Other fatigue: Secondary | ICD-10-CM | POA: Diagnosis not present

## 2015-03-05 DIAGNOSIS — D509 Iron deficiency anemia, unspecified: Secondary | ICD-10-CM | POA: Diagnosis not present

## 2015-03-05 DIAGNOSIS — N186 End stage renal disease: Secondary | ICD-10-CM | POA: Diagnosis not present

## 2015-03-05 DIAGNOSIS — Z6831 Body mass index (BMI) 31.0-31.9, adult: Secondary | ICD-10-CM | POA: Diagnosis not present

## 2015-03-05 DIAGNOSIS — I10 Essential (primary) hypertension: Secondary | ICD-10-CM | POA: Diagnosis not present

## 2015-03-05 DIAGNOSIS — Z992 Dependence on renal dialysis: Secondary | ICD-10-CM | POA: Diagnosis not present

## 2015-03-05 DIAGNOSIS — Z418 Encounter for other procedures for purposes other than remedying health state: Secondary | ICD-10-CM | POA: Diagnosis not present

## 2015-03-06 DIAGNOSIS — Z992 Dependence on renal dialysis: Secondary | ICD-10-CM | POA: Diagnosis not present

## 2015-03-06 DIAGNOSIS — N186 End stage renal disease: Secondary | ICD-10-CM | POA: Diagnosis not present

## 2015-03-06 DIAGNOSIS — D509 Iron deficiency anemia, unspecified: Secondary | ICD-10-CM | POA: Diagnosis not present

## 2015-03-07 DIAGNOSIS — Z992 Dependence on renal dialysis: Secondary | ICD-10-CM | POA: Diagnosis not present

## 2015-03-07 DIAGNOSIS — N186 End stage renal disease: Secondary | ICD-10-CM | POA: Diagnosis not present

## 2015-03-07 DIAGNOSIS — N2581 Secondary hyperparathyroidism of renal origin: Secondary | ICD-10-CM | POA: Diagnosis not present

## 2015-03-07 DIAGNOSIS — D509 Iron deficiency anemia, unspecified: Secondary | ICD-10-CM | POA: Diagnosis not present

## 2015-03-08 DIAGNOSIS — N186 End stage renal disease: Secondary | ICD-10-CM | POA: Diagnosis not present

## 2015-03-08 DIAGNOSIS — Z992 Dependence on renal dialysis: Secondary | ICD-10-CM | POA: Diagnosis not present

## 2015-03-08 DIAGNOSIS — N2581 Secondary hyperparathyroidism of renal origin: Secondary | ICD-10-CM | POA: Diagnosis not present

## 2015-03-08 DIAGNOSIS — D509 Iron deficiency anemia, unspecified: Secondary | ICD-10-CM | POA: Diagnosis not present

## 2015-03-09 DIAGNOSIS — Z992 Dependence on renal dialysis: Secondary | ICD-10-CM | POA: Diagnosis not present

## 2015-03-09 DIAGNOSIS — N186 End stage renal disease: Secondary | ICD-10-CM | POA: Diagnosis not present

## 2015-03-09 DIAGNOSIS — N2581 Secondary hyperparathyroidism of renal origin: Secondary | ICD-10-CM | POA: Diagnosis not present

## 2015-03-09 DIAGNOSIS — D509 Iron deficiency anemia, unspecified: Secondary | ICD-10-CM | POA: Diagnosis not present

## 2015-03-10 DIAGNOSIS — G603 Idiopathic progressive neuropathy: Secondary | ICD-10-CM | POA: Diagnosis not present

## 2015-03-10 DIAGNOSIS — I10 Essential (primary) hypertension: Secondary | ICD-10-CM | POA: Diagnosis not present

## 2015-03-10 DIAGNOSIS — M792 Neuralgia and neuritis, unspecified: Secondary | ICD-10-CM | POA: Diagnosis not present

## 2015-03-10 DIAGNOSIS — N186 End stage renal disease: Secondary | ICD-10-CM | POA: Diagnosis not present

## 2015-03-10 DIAGNOSIS — Z992 Dependence on renal dialysis: Secondary | ICD-10-CM | POA: Diagnosis not present

## 2015-03-10 DIAGNOSIS — N2581 Secondary hyperparathyroidism of renal origin: Secondary | ICD-10-CM | POA: Diagnosis not present

## 2015-03-10 DIAGNOSIS — D509 Iron deficiency anemia, unspecified: Secondary | ICD-10-CM | POA: Diagnosis not present

## 2015-03-12 DIAGNOSIS — N2581 Secondary hyperparathyroidism of renal origin: Secondary | ICD-10-CM | POA: Diagnosis not present

## 2015-03-12 DIAGNOSIS — N186 End stage renal disease: Secondary | ICD-10-CM | POA: Diagnosis not present

## 2015-03-12 DIAGNOSIS — D509 Iron deficiency anemia, unspecified: Secondary | ICD-10-CM | POA: Diagnosis not present

## 2015-03-12 DIAGNOSIS — Z992 Dependence on renal dialysis: Secondary | ICD-10-CM | POA: Diagnosis not present

## 2015-03-13 DIAGNOSIS — N186 End stage renal disease: Secondary | ICD-10-CM | POA: Diagnosis not present

## 2015-03-13 DIAGNOSIS — Z418 Encounter for other procedures for purposes other than remedying health state: Secondary | ICD-10-CM | POA: Diagnosis not present

## 2015-03-13 DIAGNOSIS — N2581 Secondary hyperparathyroidism of renal origin: Secondary | ICD-10-CM | POA: Diagnosis not present

## 2015-03-13 DIAGNOSIS — M1990 Unspecified osteoarthritis, unspecified site: Secondary | ICD-10-CM | POA: Diagnosis not present

## 2015-03-13 DIAGNOSIS — Z1389 Encounter for screening for other disorder: Secondary | ICD-10-CM | POA: Diagnosis not present

## 2015-03-13 DIAGNOSIS — Z0000 Encounter for general adult medical examination without abnormal findings: Secondary | ICD-10-CM | POA: Diagnosis not present

## 2015-03-13 DIAGNOSIS — D509 Iron deficiency anemia, unspecified: Secondary | ICD-10-CM | POA: Diagnosis not present

## 2015-03-13 DIAGNOSIS — Z992 Dependence on renal dialysis: Secondary | ICD-10-CM | POA: Diagnosis not present

## 2015-03-14 DIAGNOSIS — N2581 Secondary hyperparathyroidism of renal origin: Secondary | ICD-10-CM | POA: Diagnosis not present

## 2015-03-14 DIAGNOSIS — Z992 Dependence on renal dialysis: Secondary | ICD-10-CM | POA: Diagnosis not present

## 2015-03-14 DIAGNOSIS — N186 End stage renal disease: Secondary | ICD-10-CM | POA: Diagnosis not present

## 2015-03-14 DIAGNOSIS — D509 Iron deficiency anemia, unspecified: Secondary | ICD-10-CM | POA: Diagnosis not present

## 2015-03-15 DIAGNOSIS — Z992 Dependence on renal dialysis: Secondary | ICD-10-CM | POA: Diagnosis not present

## 2015-03-15 DIAGNOSIS — D509 Iron deficiency anemia, unspecified: Secondary | ICD-10-CM | POA: Diagnosis not present

## 2015-03-15 DIAGNOSIS — N2581 Secondary hyperparathyroidism of renal origin: Secondary | ICD-10-CM | POA: Diagnosis not present

## 2015-03-15 DIAGNOSIS — N186 End stage renal disease: Secondary | ICD-10-CM | POA: Diagnosis not present

## 2015-03-17 DIAGNOSIS — N2581 Secondary hyperparathyroidism of renal origin: Secondary | ICD-10-CM | POA: Diagnosis not present

## 2015-03-17 DIAGNOSIS — N186 End stage renal disease: Secondary | ICD-10-CM | POA: Diagnosis not present

## 2015-03-17 DIAGNOSIS — Z992 Dependence on renal dialysis: Secondary | ICD-10-CM | POA: Diagnosis not present

## 2015-03-17 DIAGNOSIS — D509 Iron deficiency anemia, unspecified: Secondary | ICD-10-CM | POA: Diagnosis not present

## 2015-03-18 DIAGNOSIS — Z6831 Body mass index (BMI) 31.0-31.9, adult: Secondary | ICD-10-CM | POA: Diagnosis not present

## 2015-03-18 DIAGNOSIS — Z418 Encounter for other procedures for purposes other than remedying health state: Secondary | ICD-10-CM | POA: Diagnosis not present

## 2015-03-18 DIAGNOSIS — I10 Essential (primary) hypertension: Secondary | ICD-10-CM | POA: Diagnosis not present

## 2015-03-18 DIAGNOSIS — D509 Iron deficiency anemia, unspecified: Secondary | ICD-10-CM | POA: Diagnosis not present

## 2015-03-18 DIAGNOSIS — N2581 Secondary hyperparathyroidism of renal origin: Secondary | ICD-10-CM | POA: Diagnosis not present

## 2015-03-18 DIAGNOSIS — Z713 Dietary counseling and surveillance: Secondary | ICD-10-CM | POA: Diagnosis not present

## 2015-03-18 DIAGNOSIS — Z789 Other specified health status: Secondary | ICD-10-CM | POA: Diagnosis not present

## 2015-03-18 DIAGNOSIS — Z992 Dependence on renal dialysis: Secondary | ICD-10-CM | POA: Diagnosis not present

## 2015-03-18 DIAGNOSIS — E668 Other obesity: Secondary | ICD-10-CM | POA: Diagnosis not present

## 2015-03-18 DIAGNOSIS — N186 End stage renal disease: Secondary | ICD-10-CM | POA: Diagnosis not present

## 2015-03-19 DIAGNOSIS — N2581 Secondary hyperparathyroidism of renal origin: Secondary | ICD-10-CM | POA: Diagnosis not present

## 2015-03-19 DIAGNOSIS — D509 Iron deficiency anemia, unspecified: Secondary | ICD-10-CM | POA: Diagnosis not present

## 2015-03-19 DIAGNOSIS — N186 End stage renal disease: Secondary | ICD-10-CM | POA: Diagnosis not present

## 2015-03-19 DIAGNOSIS — Z992 Dependence on renal dialysis: Secondary | ICD-10-CM | POA: Diagnosis not present

## 2015-03-22 DIAGNOSIS — Z992 Dependence on renal dialysis: Secondary | ICD-10-CM | POA: Diagnosis not present

## 2015-03-22 DIAGNOSIS — D509 Iron deficiency anemia, unspecified: Secondary | ICD-10-CM | POA: Diagnosis not present

## 2015-03-22 DIAGNOSIS — N186 End stage renal disease: Secondary | ICD-10-CM | POA: Diagnosis not present

## 2015-03-22 DIAGNOSIS — N2581 Secondary hyperparathyroidism of renal origin: Secondary | ICD-10-CM | POA: Diagnosis not present

## 2015-03-23 DIAGNOSIS — N2581 Secondary hyperparathyroidism of renal origin: Secondary | ICD-10-CM | POA: Diagnosis not present

## 2015-03-23 DIAGNOSIS — D509 Iron deficiency anemia, unspecified: Secondary | ICD-10-CM | POA: Diagnosis not present

## 2015-03-23 DIAGNOSIS — Z992 Dependence on renal dialysis: Secondary | ICD-10-CM | POA: Diagnosis not present

## 2015-03-23 DIAGNOSIS — N186 End stage renal disease: Secondary | ICD-10-CM | POA: Diagnosis not present

## 2015-03-24 DIAGNOSIS — D509 Iron deficiency anemia, unspecified: Secondary | ICD-10-CM | POA: Diagnosis not present

## 2015-03-24 DIAGNOSIS — N2581 Secondary hyperparathyroidism of renal origin: Secondary | ICD-10-CM | POA: Diagnosis not present

## 2015-03-24 DIAGNOSIS — Z992 Dependence on renal dialysis: Secondary | ICD-10-CM | POA: Diagnosis not present

## 2015-03-24 DIAGNOSIS — N186 End stage renal disease: Secondary | ICD-10-CM | POA: Diagnosis not present

## 2015-03-25 DIAGNOSIS — N2581 Secondary hyperparathyroidism of renal origin: Secondary | ICD-10-CM | POA: Diagnosis not present

## 2015-03-25 DIAGNOSIS — Z992 Dependence on renal dialysis: Secondary | ICD-10-CM | POA: Diagnosis not present

## 2015-03-25 DIAGNOSIS — N186 End stage renal disease: Secondary | ICD-10-CM | POA: Diagnosis not present

## 2015-03-25 DIAGNOSIS — D509 Iron deficiency anemia, unspecified: Secondary | ICD-10-CM | POA: Diagnosis not present

## 2015-03-27 DIAGNOSIS — N186 End stage renal disease: Secondary | ICD-10-CM | POA: Diagnosis not present

## 2015-03-27 DIAGNOSIS — D509 Iron deficiency anemia, unspecified: Secondary | ICD-10-CM | POA: Diagnosis not present

## 2015-03-27 DIAGNOSIS — Z992 Dependence on renal dialysis: Secondary | ICD-10-CM | POA: Diagnosis not present

## 2015-03-27 DIAGNOSIS — N2581 Secondary hyperparathyroidism of renal origin: Secondary | ICD-10-CM | POA: Diagnosis not present

## 2015-03-28 DIAGNOSIS — D509 Iron deficiency anemia, unspecified: Secondary | ICD-10-CM | POA: Diagnosis not present

## 2015-03-28 DIAGNOSIS — N2581 Secondary hyperparathyroidism of renal origin: Secondary | ICD-10-CM | POA: Diagnosis not present

## 2015-03-28 DIAGNOSIS — N186 End stage renal disease: Secondary | ICD-10-CM | POA: Diagnosis not present

## 2015-03-28 DIAGNOSIS — Z992 Dependence on renal dialysis: Secondary | ICD-10-CM | POA: Diagnosis not present

## 2015-04-02 DIAGNOSIS — Z992 Dependence on renal dialysis: Secondary | ICD-10-CM | POA: Diagnosis not present

## 2015-04-02 DIAGNOSIS — D509 Iron deficiency anemia, unspecified: Secondary | ICD-10-CM | POA: Diagnosis not present

## 2015-04-02 DIAGNOSIS — N186 End stage renal disease: Secondary | ICD-10-CM | POA: Diagnosis not present

## 2015-04-02 DIAGNOSIS — N2581 Secondary hyperparathyroidism of renal origin: Secondary | ICD-10-CM | POA: Diagnosis not present

## 2015-04-03 DIAGNOSIS — N2581 Secondary hyperparathyroidism of renal origin: Secondary | ICD-10-CM | POA: Diagnosis not present

## 2015-04-03 DIAGNOSIS — N186 End stage renal disease: Secondary | ICD-10-CM | POA: Diagnosis not present

## 2015-04-03 DIAGNOSIS — Z992 Dependence on renal dialysis: Secondary | ICD-10-CM | POA: Diagnosis not present

## 2015-04-03 DIAGNOSIS — D509 Iron deficiency anemia, unspecified: Secondary | ICD-10-CM | POA: Diagnosis not present

## 2015-04-04 DIAGNOSIS — D509 Iron deficiency anemia, unspecified: Secondary | ICD-10-CM | POA: Diagnosis not present

## 2015-04-04 DIAGNOSIS — N2581 Secondary hyperparathyroidism of renal origin: Secondary | ICD-10-CM | POA: Diagnosis not present

## 2015-04-04 DIAGNOSIS — N186 End stage renal disease: Secondary | ICD-10-CM | POA: Diagnosis not present

## 2015-04-04 DIAGNOSIS — Z992 Dependence on renal dialysis: Secondary | ICD-10-CM | POA: Diagnosis not present

## 2015-04-05 DIAGNOSIS — D509 Iron deficiency anemia, unspecified: Secondary | ICD-10-CM | POA: Diagnosis not present

## 2015-04-05 DIAGNOSIS — N186 End stage renal disease: Secondary | ICD-10-CM | POA: Diagnosis not present

## 2015-04-05 DIAGNOSIS — Z992 Dependence on renal dialysis: Secondary | ICD-10-CM | POA: Diagnosis not present

## 2015-04-05 DIAGNOSIS — N2581 Secondary hyperparathyroidism of renal origin: Secondary | ICD-10-CM | POA: Diagnosis not present

## 2015-04-06 DIAGNOSIS — N186 End stage renal disease: Secondary | ICD-10-CM | POA: Diagnosis not present

## 2015-04-06 DIAGNOSIS — Z992 Dependence on renal dialysis: Secondary | ICD-10-CM | POA: Diagnosis not present

## 2015-04-06 DIAGNOSIS — N2581 Secondary hyperparathyroidism of renal origin: Secondary | ICD-10-CM | POA: Diagnosis not present

## 2015-04-06 DIAGNOSIS — D509 Iron deficiency anemia, unspecified: Secondary | ICD-10-CM | POA: Diagnosis not present

## 2015-04-09 DIAGNOSIS — N2581 Secondary hyperparathyroidism of renal origin: Secondary | ICD-10-CM | POA: Diagnosis not present

## 2015-04-09 DIAGNOSIS — D509 Iron deficiency anemia, unspecified: Secondary | ICD-10-CM | POA: Diagnosis not present

## 2015-04-09 DIAGNOSIS — Z992 Dependence on renal dialysis: Secondary | ICD-10-CM | POA: Diagnosis not present

## 2015-04-09 DIAGNOSIS — N186 End stage renal disease: Secondary | ICD-10-CM | POA: Diagnosis not present

## 2015-04-11 DIAGNOSIS — N186 End stage renal disease: Secondary | ICD-10-CM | POA: Diagnosis not present

## 2015-04-11 DIAGNOSIS — Z992 Dependence on renal dialysis: Secondary | ICD-10-CM | POA: Diagnosis not present

## 2015-04-11 DIAGNOSIS — D509 Iron deficiency anemia, unspecified: Secondary | ICD-10-CM | POA: Diagnosis not present

## 2015-04-11 DIAGNOSIS — N2581 Secondary hyperparathyroidism of renal origin: Secondary | ICD-10-CM | POA: Diagnosis not present

## 2015-04-12 DIAGNOSIS — N2581 Secondary hyperparathyroidism of renal origin: Secondary | ICD-10-CM | POA: Diagnosis not present

## 2015-04-12 DIAGNOSIS — N186 End stage renal disease: Secondary | ICD-10-CM | POA: Diagnosis not present

## 2015-04-12 DIAGNOSIS — D509 Iron deficiency anemia, unspecified: Secondary | ICD-10-CM | POA: Diagnosis not present

## 2015-04-12 DIAGNOSIS — Z992 Dependence on renal dialysis: Secondary | ICD-10-CM | POA: Diagnosis not present

## 2015-04-13 DIAGNOSIS — N186 End stage renal disease: Secondary | ICD-10-CM | POA: Diagnosis not present

## 2015-04-13 DIAGNOSIS — D509 Iron deficiency anemia, unspecified: Secondary | ICD-10-CM | POA: Diagnosis not present

## 2015-04-13 DIAGNOSIS — Z992 Dependence on renal dialysis: Secondary | ICD-10-CM | POA: Diagnosis not present

## 2015-04-13 DIAGNOSIS — N2581 Secondary hyperparathyroidism of renal origin: Secondary | ICD-10-CM | POA: Diagnosis not present

## 2015-04-14 DIAGNOSIS — N186 End stage renal disease: Secondary | ICD-10-CM | POA: Diagnosis not present

## 2015-04-14 DIAGNOSIS — Z992 Dependence on renal dialysis: Secondary | ICD-10-CM | POA: Diagnosis not present

## 2015-04-14 DIAGNOSIS — N2581 Secondary hyperparathyroidism of renal origin: Secondary | ICD-10-CM | POA: Diagnosis not present

## 2015-04-14 DIAGNOSIS — D509 Iron deficiency anemia, unspecified: Secondary | ICD-10-CM | POA: Diagnosis not present

## 2015-04-15 DIAGNOSIS — N186 End stage renal disease: Secondary | ICD-10-CM | POA: Diagnosis not present

## 2015-04-15 DIAGNOSIS — N2581 Secondary hyperparathyroidism of renal origin: Secondary | ICD-10-CM | POA: Diagnosis not present

## 2015-04-15 DIAGNOSIS — D509 Iron deficiency anemia, unspecified: Secondary | ICD-10-CM | POA: Diagnosis not present

## 2015-04-15 DIAGNOSIS — Z992 Dependence on renal dialysis: Secondary | ICD-10-CM | POA: Diagnosis not present

## 2015-04-16 DIAGNOSIS — N2581 Secondary hyperparathyroidism of renal origin: Secondary | ICD-10-CM | POA: Diagnosis not present

## 2015-04-16 DIAGNOSIS — N186 End stage renal disease: Secondary | ICD-10-CM | POA: Diagnosis not present

## 2015-04-16 DIAGNOSIS — Z992 Dependence on renal dialysis: Secondary | ICD-10-CM | POA: Diagnosis not present

## 2015-04-16 DIAGNOSIS — D509 Iron deficiency anemia, unspecified: Secondary | ICD-10-CM | POA: Diagnosis not present

## 2015-04-17 DIAGNOSIS — D509 Iron deficiency anemia, unspecified: Secondary | ICD-10-CM | POA: Diagnosis not present

## 2015-04-17 DIAGNOSIS — N2581 Secondary hyperparathyroidism of renal origin: Secondary | ICD-10-CM | POA: Diagnosis not present

## 2015-04-17 DIAGNOSIS — N186 End stage renal disease: Secondary | ICD-10-CM | POA: Diagnosis not present

## 2015-04-17 DIAGNOSIS — Z992 Dependence on renal dialysis: Secondary | ICD-10-CM | POA: Diagnosis not present

## 2015-04-18 DIAGNOSIS — N2581 Secondary hyperparathyroidism of renal origin: Secondary | ICD-10-CM | POA: Diagnosis not present

## 2015-04-18 DIAGNOSIS — N186 End stage renal disease: Secondary | ICD-10-CM | POA: Diagnosis not present

## 2015-04-18 DIAGNOSIS — D509 Iron deficiency anemia, unspecified: Secondary | ICD-10-CM | POA: Diagnosis not present

## 2015-04-18 DIAGNOSIS — Z992 Dependence on renal dialysis: Secondary | ICD-10-CM | POA: Diagnosis not present

## 2015-04-19 DIAGNOSIS — N186 End stage renal disease: Secondary | ICD-10-CM | POA: Diagnosis not present

## 2015-04-19 DIAGNOSIS — D509 Iron deficiency anemia, unspecified: Secondary | ICD-10-CM | POA: Diagnosis not present

## 2015-04-19 DIAGNOSIS — Z992 Dependence on renal dialysis: Secondary | ICD-10-CM | POA: Diagnosis not present

## 2015-04-19 DIAGNOSIS — N2581 Secondary hyperparathyroidism of renal origin: Secondary | ICD-10-CM | POA: Diagnosis not present

## 2015-04-20 DIAGNOSIS — Z992 Dependence on renal dialysis: Secondary | ICD-10-CM | POA: Diagnosis not present

## 2015-04-20 DIAGNOSIS — N2581 Secondary hyperparathyroidism of renal origin: Secondary | ICD-10-CM | POA: Diagnosis not present

## 2015-04-20 DIAGNOSIS — D509 Iron deficiency anemia, unspecified: Secondary | ICD-10-CM | POA: Diagnosis not present

## 2015-04-20 DIAGNOSIS — N186 End stage renal disease: Secondary | ICD-10-CM | POA: Diagnosis not present

## 2015-04-21 DIAGNOSIS — N186 End stage renal disease: Secondary | ICD-10-CM | POA: Diagnosis not present

## 2015-04-21 DIAGNOSIS — D509 Iron deficiency anemia, unspecified: Secondary | ICD-10-CM | POA: Diagnosis not present

## 2015-04-21 DIAGNOSIS — Z992 Dependence on renal dialysis: Secondary | ICD-10-CM | POA: Diagnosis not present

## 2015-04-21 DIAGNOSIS — N2581 Secondary hyperparathyroidism of renal origin: Secondary | ICD-10-CM | POA: Diagnosis not present

## 2015-04-22 DIAGNOSIS — Z992 Dependence on renal dialysis: Secondary | ICD-10-CM | POA: Diagnosis not present

## 2015-04-22 DIAGNOSIS — D509 Iron deficiency anemia, unspecified: Secondary | ICD-10-CM | POA: Diagnosis not present

## 2015-04-22 DIAGNOSIS — N2581 Secondary hyperparathyroidism of renal origin: Secondary | ICD-10-CM | POA: Diagnosis not present

## 2015-04-22 DIAGNOSIS — N186 End stage renal disease: Secondary | ICD-10-CM | POA: Diagnosis not present

## 2015-04-23 DIAGNOSIS — N186 End stage renal disease: Secondary | ICD-10-CM | POA: Diagnosis not present

## 2015-04-23 DIAGNOSIS — N2581 Secondary hyperparathyroidism of renal origin: Secondary | ICD-10-CM | POA: Diagnosis not present

## 2015-04-23 DIAGNOSIS — D509 Iron deficiency anemia, unspecified: Secondary | ICD-10-CM | POA: Diagnosis not present

## 2015-04-23 DIAGNOSIS — Z992 Dependence on renal dialysis: Secondary | ICD-10-CM | POA: Diagnosis not present

## 2015-04-24 DIAGNOSIS — Z992 Dependence on renal dialysis: Secondary | ICD-10-CM | POA: Diagnosis not present

## 2015-04-24 DIAGNOSIS — D509 Iron deficiency anemia, unspecified: Secondary | ICD-10-CM | POA: Diagnosis not present

## 2015-04-24 DIAGNOSIS — N186 End stage renal disease: Secondary | ICD-10-CM | POA: Diagnosis not present

## 2015-04-24 DIAGNOSIS — N2581 Secondary hyperparathyroidism of renal origin: Secondary | ICD-10-CM | POA: Diagnosis not present

## 2015-04-25 DIAGNOSIS — D509 Iron deficiency anemia, unspecified: Secondary | ICD-10-CM | POA: Diagnosis not present

## 2015-04-25 DIAGNOSIS — Z992 Dependence on renal dialysis: Secondary | ICD-10-CM | POA: Diagnosis not present

## 2015-04-25 DIAGNOSIS — N2581 Secondary hyperparathyroidism of renal origin: Secondary | ICD-10-CM | POA: Diagnosis not present

## 2015-04-25 DIAGNOSIS — N186 End stage renal disease: Secondary | ICD-10-CM | POA: Diagnosis not present

## 2015-04-26 DIAGNOSIS — N2581 Secondary hyperparathyroidism of renal origin: Secondary | ICD-10-CM | POA: Diagnosis not present

## 2015-04-26 DIAGNOSIS — D509 Iron deficiency anemia, unspecified: Secondary | ICD-10-CM | POA: Diagnosis not present

## 2015-04-26 DIAGNOSIS — Z992 Dependence on renal dialysis: Secondary | ICD-10-CM | POA: Diagnosis not present

## 2015-04-26 DIAGNOSIS — N186 End stage renal disease: Secondary | ICD-10-CM | POA: Diagnosis not present

## 2015-04-27 DIAGNOSIS — N2581 Secondary hyperparathyroidism of renal origin: Secondary | ICD-10-CM | POA: Diagnosis not present

## 2015-04-27 DIAGNOSIS — Z992 Dependence on renal dialysis: Secondary | ICD-10-CM | POA: Diagnosis not present

## 2015-04-27 DIAGNOSIS — N186 End stage renal disease: Secondary | ICD-10-CM | POA: Diagnosis not present

## 2015-04-27 DIAGNOSIS — D509 Iron deficiency anemia, unspecified: Secondary | ICD-10-CM | POA: Diagnosis not present

## 2015-04-28 DIAGNOSIS — Z992 Dependence on renal dialysis: Secondary | ICD-10-CM | POA: Diagnosis not present

## 2015-04-28 DIAGNOSIS — N2581 Secondary hyperparathyroidism of renal origin: Secondary | ICD-10-CM | POA: Diagnosis not present

## 2015-04-28 DIAGNOSIS — D509 Iron deficiency anemia, unspecified: Secondary | ICD-10-CM | POA: Diagnosis not present

## 2015-04-28 DIAGNOSIS — N186 End stage renal disease: Secondary | ICD-10-CM | POA: Diagnosis not present

## 2015-04-29 DIAGNOSIS — Z992 Dependence on renal dialysis: Secondary | ICD-10-CM | POA: Diagnosis not present

## 2015-04-29 DIAGNOSIS — D509 Iron deficiency anemia, unspecified: Secondary | ICD-10-CM | POA: Diagnosis not present

## 2015-04-29 DIAGNOSIS — N2581 Secondary hyperparathyroidism of renal origin: Secondary | ICD-10-CM | POA: Diagnosis not present

## 2015-04-29 DIAGNOSIS — N186 End stage renal disease: Secondary | ICD-10-CM | POA: Diagnosis not present

## 2015-04-30 DIAGNOSIS — N186 End stage renal disease: Secondary | ICD-10-CM | POA: Diagnosis not present

## 2015-04-30 DIAGNOSIS — Z992 Dependence on renal dialysis: Secondary | ICD-10-CM | POA: Diagnosis not present

## 2015-04-30 DIAGNOSIS — N2581 Secondary hyperparathyroidism of renal origin: Secondary | ICD-10-CM | POA: Diagnosis not present

## 2015-04-30 DIAGNOSIS — D509 Iron deficiency anemia, unspecified: Secondary | ICD-10-CM | POA: Diagnosis not present

## 2015-05-01 DIAGNOSIS — N186 End stage renal disease: Secondary | ICD-10-CM | POA: Diagnosis not present

## 2015-05-01 DIAGNOSIS — D509 Iron deficiency anemia, unspecified: Secondary | ICD-10-CM | POA: Diagnosis not present

## 2015-05-01 DIAGNOSIS — N2581 Secondary hyperparathyroidism of renal origin: Secondary | ICD-10-CM | POA: Diagnosis not present

## 2015-05-01 DIAGNOSIS — Z992 Dependence on renal dialysis: Secondary | ICD-10-CM | POA: Diagnosis not present

## 2015-05-02 DIAGNOSIS — Z992 Dependence on renal dialysis: Secondary | ICD-10-CM | POA: Diagnosis not present

## 2015-05-02 DIAGNOSIS — D509 Iron deficiency anemia, unspecified: Secondary | ICD-10-CM | POA: Diagnosis not present

## 2015-05-02 DIAGNOSIS — N2581 Secondary hyperparathyroidism of renal origin: Secondary | ICD-10-CM | POA: Diagnosis not present

## 2015-05-02 DIAGNOSIS — N186 End stage renal disease: Secondary | ICD-10-CM | POA: Diagnosis not present

## 2015-05-03 DIAGNOSIS — N186 End stage renal disease: Secondary | ICD-10-CM | POA: Diagnosis not present

## 2015-05-03 DIAGNOSIS — Z992 Dependence on renal dialysis: Secondary | ICD-10-CM | POA: Diagnosis not present

## 2015-05-03 DIAGNOSIS — N2581 Secondary hyperparathyroidism of renal origin: Secondary | ICD-10-CM | POA: Diagnosis not present

## 2015-05-03 DIAGNOSIS — D509 Iron deficiency anemia, unspecified: Secondary | ICD-10-CM | POA: Diagnosis not present

## 2015-05-04 DIAGNOSIS — N186 End stage renal disease: Secondary | ICD-10-CM | POA: Diagnosis not present

## 2015-05-04 DIAGNOSIS — D509 Iron deficiency anemia, unspecified: Secondary | ICD-10-CM | POA: Diagnosis not present

## 2015-05-04 DIAGNOSIS — Z992 Dependence on renal dialysis: Secondary | ICD-10-CM | POA: Diagnosis not present

## 2015-05-04 DIAGNOSIS — N2581 Secondary hyperparathyroidism of renal origin: Secondary | ICD-10-CM | POA: Diagnosis not present

## 2015-05-05 DIAGNOSIS — Z992 Dependence on renal dialysis: Secondary | ICD-10-CM | POA: Diagnosis not present

## 2015-05-05 DIAGNOSIS — N2581 Secondary hyperparathyroidism of renal origin: Secondary | ICD-10-CM | POA: Diagnosis not present

## 2015-05-05 DIAGNOSIS — D509 Iron deficiency anemia, unspecified: Secondary | ICD-10-CM | POA: Diagnosis not present

## 2015-05-05 DIAGNOSIS — N186 End stage renal disease: Secondary | ICD-10-CM | POA: Diagnosis not present

## 2015-05-06 DIAGNOSIS — N2581 Secondary hyperparathyroidism of renal origin: Secondary | ICD-10-CM | POA: Diagnosis not present

## 2015-05-06 DIAGNOSIS — D509 Iron deficiency anemia, unspecified: Secondary | ICD-10-CM | POA: Diagnosis not present

## 2015-05-06 DIAGNOSIS — Z992 Dependence on renal dialysis: Secondary | ICD-10-CM | POA: Diagnosis not present

## 2015-05-06 DIAGNOSIS — N186 End stage renal disease: Secondary | ICD-10-CM | POA: Diagnosis not present

## 2015-05-07 DIAGNOSIS — N186 End stage renal disease: Secondary | ICD-10-CM | POA: Diagnosis not present

## 2015-05-07 DIAGNOSIS — Z992 Dependence on renal dialysis: Secondary | ICD-10-CM | POA: Diagnosis not present

## 2015-05-07 DIAGNOSIS — D509 Iron deficiency anemia, unspecified: Secondary | ICD-10-CM | POA: Diagnosis not present

## 2015-05-08 DIAGNOSIS — D509 Iron deficiency anemia, unspecified: Secondary | ICD-10-CM | POA: Diagnosis not present

## 2015-05-08 DIAGNOSIS — N186 End stage renal disease: Secondary | ICD-10-CM | POA: Diagnosis not present

## 2015-05-08 DIAGNOSIS — Z992 Dependence on renal dialysis: Secondary | ICD-10-CM | POA: Diagnosis not present

## 2015-05-09 DIAGNOSIS — N186 End stage renal disease: Secondary | ICD-10-CM | POA: Diagnosis not present

## 2015-05-09 DIAGNOSIS — D509 Iron deficiency anemia, unspecified: Secondary | ICD-10-CM | POA: Diagnosis not present

## 2015-05-09 DIAGNOSIS — Z992 Dependence on renal dialysis: Secondary | ICD-10-CM | POA: Diagnosis not present

## 2015-05-10 DIAGNOSIS — N186 End stage renal disease: Secondary | ICD-10-CM | POA: Diagnosis not present

## 2015-05-10 DIAGNOSIS — D509 Iron deficiency anemia, unspecified: Secondary | ICD-10-CM | POA: Diagnosis not present

## 2015-05-10 DIAGNOSIS — Z992 Dependence on renal dialysis: Secondary | ICD-10-CM | POA: Diagnosis not present

## 2015-05-11 DIAGNOSIS — D509 Iron deficiency anemia, unspecified: Secondary | ICD-10-CM | POA: Diagnosis not present

## 2015-05-11 DIAGNOSIS — Z992 Dependence on renal dialysis: Secondary | ICD-10-CM | POA: Diagnosis not present

## 2015-05-11 DIAGNOSIS — N186 End stage renal disease: Secondary | ICD-10-CM | POA: Diagnosis not present

## 2015-05-12 DIAGNOSIS — Z992 Dependence on renal dialysis: Secondary | ICD-10-CM | POA: Diagnosis not present

## 2015-05-12 DIAGNOSIS — N186 End stage renal disease: Secondary | ICD-10-CM | POA: Diagnosis not present

## 2015-05-12 DIAGNOSIS — D509 Iron deficiency anemia, unspecified: Secondary | ICD-10-CM | POA: Diagnosis not present

## 2015-05-13 DIAGNOSIS — Z992 Dependence on renal dialysis: Secondary | ICD-10-CM | POA: Diagnosis not present

## 2015-05-13 DIAGNOSIS — N186 End stage renal disease: Secondary | ICD-10-CM | POA: Diagnosis not present

## 2015-05-13 DIAGNOSIS — D509 Iron deficiency anemia, unspecified: Secondary | ICD-10-CM | POA: Diagnosis not present

## 2015-05-14 DIAGNOSIS — D509 Iron deficiency anemia, unspecified: Secondary | ICD-10-CM | POA: Diagnosis not present

## 2015-05-14 DIAGNOSIS — N186 End stage renal disease: Secondary | ICD-10-CM | POA: Diagnosis not present

## 2015-05-14 DIAGNOSIS — Z992 Dependence on renal dialysis: Secondary | ICD-10-CM | POA: Diagnosis not present

## 2015-05-15 DIAGNOSIS — N186 End stage renal disease: Secondary | ICD-10-CM | POA: Diagnosis not present

## 2015-05-15 DIAGNOSIS — D509 Iron deficiency anemia, unspecified: Secondary | ICD-10-CM | POA: Diagnosis not present

## 2015-05-15 DIAGNOSIS — Z992 Dependence on renal dialysis: Secondary | ICD-10-CM | POA: Diagnosis not present

## 2015-05-16 DIAGNOSIS — D509 Iron deficiency anemia, unspecified: Secondary | ICD-10-CM | POA: Diagnosis not present

## 2015-05-16 DIAGNOSIS — Z992 Dependence on renal dialysis: Secondary | ICD-10-CM | POA: Diagnosis not present

## 2015-05-16 DIAGNOSIS — N186 End stage renal disease: Secondary | ICD-10-CM | POA: Diagnosis not present

## 2015-05-17 DIAGNOSIS — Z992 Dependence on renal dialysis: Secondary | ICD-10-CM | POA: Diagnosis not present

## 2015-05-17 DIAGNOSIS — N186 End stage renal disease: Secondary | ICD-10-CM | POA: Diagnosis not present

## 2015-05-17 DIAGNOSIS — D509 Iron deficiency anemia, unspecified: Secondary | ICD-10-CM | POA: Diagnosis not present

## 2015-05-18 DIAGNOSIS — D509 Iron deficiency anemia, unspecified: Secondary | ICD-10-CM | POA: Diagnosis not present

## 2015-05-18 DIAGNOSIS — Z992 Dependence on renal dialysis: Secondary | ICD-10-CM | POA: Diagnosis not present

## 2015-05-18 DIAGNOSIS — N186 End stage renal disease: Secondary | ICD-10-CM | POA: Diagnosis not present

## 2015-05-19 DIAGNOSIS — D509 Iron deficiency anemia, unspecified: Secondary | ICD-10-CM | POA: Diagnosis not present

## 2015-05-19 DIAGNOSIS — N186 End stage renal disease: Secondary | ICD-10-CM | POA: Diagnosis not present

## 2015-05-19 DIAGNOSIS — Z992 Dependence on renal dialysis: Secondary | ICD-10-CM | POA: Diagnosis not present

## 2015-05-20 DIAGNOSIS — Z992 Dependence on renal dialysis: Secondary | ICD-10-CM | POA: Diagnosis not present

## 2015-05-20 DIAGNOSIS — D509 Iron deficiency anemia, unspecified: Secondary | ICD-10-CM | POA: Diagnosis not present

## 2015-05-20 DIAGNOSIS — N186 End stage renal disease: Secondary | ICD-10-CM | POA: Diagnosis not present

## 2015-05-21 DIAGNOSIS — Z992 Dependence on renal dialysis: Secondary | ICD-10-CM | POA: Diagnosis not present

## 2015-05-21 DIAGNOSIS — N186 End stage renal disease: Secondary | ICD-10-CM | POA: Diagnosis not present

## 2015-05-21 DIAGNOSIS — D509 Iron deficiency anemia, unspecified: Secondary | ICD-10-CM | POA: Diagnosis not present

## 2015-05-22 DIAGNOSIS — Z992 Dependence on renal dialysis: Secondary | ICD-10-CM | POA: Diagnosis not present

## 2015-05-22 DIAGNOSIS — N186 End stage renal disease: Secondary | ICD-10-CM | POA: Diagnosis not present

## 2015-05-22 DIAGNOSIS — D509 Iron deficiency anemia, unspecified: Secondary | ICD-10-CM | POA: Diagnosis not present

## 2015-05-23 DIAGNOSIS — N186 End stage renal disease: Secondary | ICD-10-CM | POA: Diagnosis not present

## 2015-05-23 DIAGNOSIS — Z992 Dependence on renal dialysis: Secondary | ICD-10-CM | POA: Diagnosis not present

## 2015-05-23 DIAGNOSIS — D509 Iron deficiency anemia, unspecified: Secondary | ICD-10-CM | POA: Diagnosis not present

## 2015-05-24 DIAGNOSIS — N186 End stage renal disease: Secondary | ICD-10-CM | POA: Diagnosis not present

## 2015-05-24 DIAGNOSIS — Z992 Dependence on renal dialysis: Secondary | ICD-10-CM | POA: Diagnosis not present

## 2015-05-24 DIAGNOSIS — D509 Iron deficiency anemia, unspecified: Secondary | ICD-10-CM | POA: Diagnosis not present

## 2015-05-25 DIAGNOSIS — D509 Iron deficiency anemia, unspecified: Secondary | ICD-10-CM | POA: Diagnosis not present

## 2015-05-25 DIAGNOSIS — N186 End stage renal disease: Secondary | ICD-10-CM | POA: Diagnosis not present

## 2015-05-25 DIAGNOSIS — Z992 Dependence on renal dialysis: Secondary | ICD-10-CM | POA: Diagnosis not present

## 2015-05-26 DIAGNOSIS — N186 End stage renal disease: Secondary | ICD-10-CM | POA: Diagnosis not present

## 2015-05-26 DIAGNOSIS — D509 Iron deficiency anemia, unspecified: Secondary | ICD-10-CM | POA: Diagnosis not present

## 2015-05-26 DIAGNOSIS — Z992 Dependence on renal dialysis: Secondary | ICD-10-CM | POA: Diagnosis not present

## 2015-05-27 DIAGNOSIS — D509 Iron deficiency anemia, unspecified: Secondary | ICD-10-CM | POA: Diagnosis not present

## 2015-05-27 DIAGNOSIS — Z992 Dependence on renal dialysis: Secondary | ICD-10-CM | POA: Diagnosis not present

## 2015-05-27 DIAGNOSIS — N186 End stage renal disease: Secondary | ICD-10-CM | POA: Diagnosis not present

## 2015-05-28 DIAGNOSIS — Z992 Dependence on renal dialysis: Secondary | ICD-10-CM | POA: Diagnosis not present

## 2015-05-28 DIAGNOSIS — D509 Iron deficiency anemia, unspecified: Secondary | ICD-10-CM | POA: Diagnosis not present

## 2015-05-28 DIAGNOSIS — N186 End stage renal disease: Secondary | ICD-10-CM | POA: Diagnosis not present

## 2015-05-29 DIAGNOSIS — D509 Iron deficiency anemia, unspecified: Secondary | ICD-10-CM | POA: Diagnosis not present

## 2015-05-29 DIAGNOSIS — N186 End stage renal disease: Secondary | ICD-10-CM | POA: Diagnosis not present

## 2015-05-29 DIAGNOSIS — Z992 Dependence on renal dialysis: Secondary | ICD-10-CM | POA: Diagnosis not present

## 2015-05-30 DIAGNOSIS — N186 End stage renal disease: Secondary | ICD-10-CM | POA: Diagnosis not present

## 2015-05-30 DIAGNOSIS — Z992 Dependence on renal dialysis: Secondary | ICD-10-CM | POA: Diagnosis not present

## 2015-05-30 DIAGNOSIS — D509 Iron deficiency anemia, unspecified: Secondary | ICD-10-CM | POA: Diagnosis not present

## 2015-05-31 DIAGNOSIS — Z992 Dependence on renal dialysis: Secondary | ICD-10-CM | POA: Diagnosis not present

## 2015-05-31 DIAGNOSIS — N186 End stage renal disease: Secondary | ICD-10-CM | POA: Diagnosis not present

## 2015-05-31 DIAGNOSIS — D509 Iron deficiency anemia, unspecified: Secondary | ICD-10-CM | POA: Diagnosis not present

## 2015-06-01 DIAGNOSIS — D509 Iron deficiency anemia, unspecified: Secondary | ICD-10-CM | POA: Diagnosis not present

## 2015-06-01 DIAGNOSIS — N186 End stage renal disease: Secondary | ICD-10-CM | POA: Diagnosis not present

## 2015-06-01 DIAGNOSIS — Z992 Dependence on renal dialysis: Secondary | ICD-10-CM | POA: Diagnosis not present

## 2015-06-02 DIAGNOSIS — N186 End stage renal disease: Secondary | ICD-10-CM | POA: Diagnosis not present

## 2015-06-02 DIAGNOSIS — Z992 Dependence on renal dialysis: Secondary | ICD-10-CM | POA: Diagnosis not present

## 2015-06-02 DIAGNOSIS — D509 Iron deficiency anemia, unspecified: Secondary | ICD-10-CM | POA: Diagnosis not present

## 2015-06-03 DIAGNOSIS — D509 Iron deficiency anemia, unspecified: Secondary | ICD-10-CM | POA: Diagnosis not present

## 2015-06-03 DIAGNOSIS — N186 End stage renal disease: Secondary | ICD-10-CM | POA: Diagnosis not present

## 2015-06-03 DIAGNOSIS — Z992 Dependence on renal dialysis: Secondary | ICD-10-CM | POA: Diagnosis not present

## 2015-06-04 DIAGNOSIS — Z992 Dependence on renal dialysis: Secondary | ICD-10-CM | POA: Diagnosis not present

## 2015-06-04 DIAGNOSIS — N186 End stage renal disease: Secondary | ICD-10-CM | POA: Diagnosis not present

## 2015-06-04 DIAGNOSIS — D509 Iron deficiency anemia, unspecified: Secondary | ICD-10-CM | POA: Diagnosis not present

## 2015-06-05 DIAGNOSIS — N186 End stage renal disease: Secondary | ICD-10-CM | POA: Diagnosis not present

## 2015-06-05 DIAGNOSIS — D509 Iron deficiency anemia, unspecified: Secondary | ICD-10-CM | POA: Diagnosis not present

## 2015-06-05 DIAGNOSIS — Z992 Dependence on renal dialysis: Secondary | ICD-10-CM | POA: Diagnosis not present

## 2015-06-06 DIAGNOSIS — N186 End stage renal disease: Secondary | ICD-10-CM | POA: Diagnosis not present

## 2015-06-06 DIAGNOSIS — D509 Iron deficiency anemia, unspecified: Secondary | ICD-10-CM | POA: Diagnosis not present

## 2015-06-06 DIAGNOSIS — Z992 Dependence on renal dialysis: Secondary | ICD-10-CM | POA: Diagnosis not present

## 2015-06-06 DIAGNOSIS — N2581 Secondary hyperparathyroidism of renal origin: Secondary | ICD-10-CM | POA: Diagnosis not present

## 2015-06-07 DIAGNOSIS — N186 End stage renal disease: Secondary | ICD-10-CM | POA: Diagnosis not present

## 2015-06-07 DIAGNOSIS — Z992 Dependence on renal dialysis: Secondary | ICD-10-CM | POA: Diagnosis not present

## 2015-06-07 DIAGNOSIS — N2581 Secondary hyperparathyroidism of renal origin: Secondary | ICD-10-CM | POA: Diagnosis not present

## 2015-06-07 DIAGNOSIS — D509 Iron deficiency anemia, unspecified: Secondary | ICD-10-CM | POA: Diagnosis not present

## 2015-06-08 DIAGNOSIS — Z992 Dependence on renal dialysis: Secondary | ICD-10-CM | POA: Diagnosis not present

## 2015-06-08 DIAGNOSIS — D509 Iron deficiency anemia, unspecified: Secondary | ICD-10-CM | POA: Diagnosis not present

## 2015-06-08 DIAGNOSIS — N186 End stage renal disease: Secondary | ICD-10-CM | POA: Diagnosis not present

## 2015-06-08 DIAGNOSIS — N2581 Secondary hyperparathyroidism of renal origin: Secondary | ICD-10-CM | POA: Diagnosis not present

## 2015-06-09 DIAGNOSIS — D509 Iron deficiency anemia, unspecified: Secondary | ICD-10-CM | POA: Diagnosis not present

## 2015-06-09 DIAGNOSIS — N186 End stage renal disease: Secondary | ICD-10-CM | POA: Diagnosis not present

## 2015-06-09 DIAGNOSIS — Z992 Dependence on renal dialysis: Secondary | ICD-10-CM | POA: Diagnosis not present

## 2015-06-09 DIAGNOSIS — N2581 Secondary hyperparathyroidism of renal origin: Secondary | ICD-10-CM | POA: Diagnosis not present

## 2015-06-10 DIAGNOSIS — N186 End stage renal disease: Secondary | ICD-10-CM | POA: Diagnosis not present

## 2015-06-10 DIAGNOSIS — Z992 Dependence on renal dialysis: Secondary | ICD-10-CM | POA: Diagnosis not present

## 2015-06-10 DIAGNOSIS — D509 Iron deficiency anemia, unspecified: Secondary | ICD-10-CM | POA: Diagnosis not present

## 2015-06-10 DIAGNOSIS — N2581 Secondary hyperparathyroidism of renal origin: Secondary | ICD-10-CM | POA: Diagnosis not present

## 2015-06-11 DIAGNOSIS — Z992 Dependence on renal dialysis: Secondary | ICD-10-CM | POA: Diagnosis not present

## 2015-06-11 DIAGNOSIS — N2581 Secondary hyperparathyroidism of renal origin: Secondary | ICD-10-CM | POA: Diagnosis not present

## 2015-06-11 DIAGNOSIS — N186 End stage renal disease: Secondary | ICD-10-CM | POA: Diagnosis not present

## 2015-06-11 DIAGNOSIS — D509 Iron deficiency anemia, unspecified: Secondary | ICD-10-CM | POA: Diagnosis not present

## 2015-06-12 DIAGNOSIS — N186 End stage renal disease: Secondary | ICD-10-CM | POA: Diagnosis not present

## 2015-06-12 DIAGNOSIS — Z992 Dependence on renal dialysis: Secondary | ICD-10-CM | POA: Diagnosis not present

## 2015-06-12 DIAGNOSIS — N2581 Secondary hyperparathyroidism of renal origin: Secondary | ICD-10-CM | POA: Diagnosis not present

## 2015-06-12 DIAGNOSIS — D509 Iron deficiency anemia, unspecified: Secondary | ICD-10-CM | POA: Diagnosis not present

## 2015-06-13 DIAGNOSIS — Z992 Dependence on renal dialysis: Secondary | ICD-10-CM | POA: Diagnosis not present

## 2015-06-13 DIAGNOSIS — N186 End stage renal disease: Secondary | ICD-10-CM | POA: Diagnosis not present

## 2015-06-13 DIAGNOSIS — N2581 Secondary hyperparathyroidism of renal origin: Secondary | ICD-10-CM | POA: Diagnosis not present

## 2015-06-13 DIAGNOSIS — D509 Iron deficiency anemia, unspecified: Secondary | ICD-10-CM | POA: Diagnosis not present

## 2015-06-14 DIAGNOSIS — D509 Iron deficiency anemia, unspecified: Secondary | ICD-10-CM | POA: Diagnosis not present

## 2015-06-14 DIAGNOSIS — N186 End stage renal disease: Secondary | ICD-10-CM | POA: Diagnosis not present

## 2015-06-14 DIAGNOSIS — N2581 Secondary hyperparathyroidism of renal origin: Secondary | ICD-10-CM | POA: Diagnosis not present

## 2015-06-14 DIAGNOSIS — Z992 Dependence on renal dialysis: Secondary | ICD-10-CM | POA: Diagnosis not present

## 2015-06-15 DIAGNOSIS — N2581 Secondary hyperparathyroidism of renal origin: Secondary | ICD-10-CM | POA: Diagnosis not present

## 2015-06-15 DIAGNOSIS — N186 End stage renal disease: Secondary | ICD-10-CM | POA: Diagnosis not present

## 2015-06-15 DIAGNOSIS — D509 Iron deficiency anemia, unspecified: Secondary | ICD-10-CM | POA: Diagnosis not present

## 2015-06-15 DIAGNOSIS — Z992 Dependence on renal dialysis: Secondary | ICD-10-CM | POA: Diagnosis not present

## 2015-06-16 DIAGNOSIS — Z992 Dependence on renal dialysis: Secondary | ICD-10-CM | POA: Diagnosis not present

## 2015-06-16 DIAGNOSIS — N2581 Secondary hyperparathyroidism of renal origin: Secondary | ICD-10-CM | POA: Diagnosis not present

## 2015-06-16 DIAGNOSIS — N186 End stage renal disease: Secondary | ICD-10-CM | POA: Diagnosis not present

## 2015-06-16 DIAGNOSIS — D509 Iron deficiency anemia, unspecified: Secondary | ICD-10-CM | POA: Diagnosis not present

## 2015-06-19 DIAGNOSIS — D509 Iron deficiency anemia, unspecified: Secondary | ICD-10-CM | POA: Diagnosis not present

## 2015-06-19 DIAGNOSIS — N186 End stage renal disease: Secondary | ICD-10-CM | POA: Diagnosis not present

## 2015-06-19 DIAGNOSIS — Z992 Dependence on renal dialysis: Secondary | ICD-10-CM | POA: Diagnosis not present

## 2015-06-19 DIAGNOSIS — N2581 Secondary hyperparathyroidism of renal origin: Secondary | ICD-10-CM | POA: Diagnosis not present

## 2015-06-20 DIAGNOSIS — N186 End stage renal disease: Secondary | ICD-10-CM | POA: Diagnosis not present

## 2015-06-20 DIAGNOSIS — N2581 Secondary hyperparathyroidism of renal origin: Secondary | ICD-10-CM | POA: Diagnosis not present

## 2015-06-20 DIAGNOSIS — Z992 Dependence on renal dialysis: Secondary | ICD-10-CM | POA: Diagnosis not present

## 2015-06-20 DIAGNOSIS — D509 Iron deficiency anemia, unspecified: Secondary | ICD-10-CM | POA: Diagnosis not present

## 2015-06-22 DIAGNOSIS — N2581 Secondary hyperparathyroidism of renal origin: Secondary | ICD-10-CM | POA: Diagnosis not present

## 2015-06-22 DIAGNOSIS — N186 End stage renal disease: Secondary | ICD-10-CM | POA: Diagnosis not present

## 2015-06-22 DIAGNOSIS — Z992 Dependence on renal dialysis: Secondary | ICD-10-CM | POA: Diagnosis not present

## 2015-06-22 DIAGNOSIS — D509 Iron deficiency anemia, unspecified: Secondary | ICD-10-CM | POA: Diagnosis not present

## 2015-06-23 DIAGNOSIS — N186 End stage renal disease: Secondary | ICD-10-CM | POA: Diagnosis not present

## 2015-06-23 DIAGNOSIS — D509 Iron deficiency anemia, unspecified: Secondary | ICD-10-CM | POA: Diagnosis not present

## 2015-06-23 DIAGNOSIS — N2581 Secondary hyperparathyroidism of renal origin: Secondary | ICD-10-CM | POA: Diagnosis not present

## 2015-06-23 DIAGNOSIS — Z992 Dependence on renal dialysis: Secondary | ICD-10-CM | POA: Diagnosis not present

## 2015-06-25 DIAGNOSIS — N2581 Secondary hyperparathyroidism of renal origin: Secondary | ICD-10-CM | POA: Diagnosis not present

## 2015-06-25 DIAGNOSIS — Z992 Dependence on renal dialysis: Secondary | ICD-10-CM | POA: Diagnosis not present

## 2015-06-25 DIAGNOSIS — N186 End stage renal disease: Secondary | ICD-10-CM | POA: Diagnosis not present

## 2015-06-25 DIAGNOSIS — D509 Iron deficiency anemia, unspecified: Secondary | ICD-10-CM | POA: Diagnosis not present

## 2015-06-26 DIAGNOSIS — Z992 Dependence on renal dialysis: Secondary | ICD-10-CM | POA: Diagnosis not present

## 2015-06-26 DIAGNOSIS — D509 Iron deficiency anemia, unspecified: Secondary | ICD-10-CM | POA: Diagnosis not present

## 2015-06-26 DIAGNOSIS — N186 End stage renal disease: Secondary | ICD-10-CM | POA: Diagnosis not present

## 2015-06-26 DIAGNOSIS — N2581 Secondary hyperparathyroidism of renal origin: Secondary | ICD-10-CM | POA: Diagnosis not present

## 2015-06-27 DIAGNOSIS — Z992 Dependence on renal dialysis: Secondary | ICD-10-CM | POA: Diagnosis not present

## 2015-06-27 DIAGNOSIS — D509 Iron deficiency anemia, unspecified: Secondary | ICD-10-CM | POA: Diagnosis not present

## 2015-06-27 DIAGNOSIS — N186 End stage renal disease: Secondary | ICD-10-CM | POA: Diagnosis not present

## 2015-06-27 DIAGNOSIS — N2581 Secondary hyperparathyroidism of renal origin: Secondary | ICD-10-CM | POA: Diagnosis not present

## 2015-06-28 DIAGNOSIS — Z992 Dependence on renal dialysis: Secondary | ICD-10-CM | POA: Diagnosis not present

## 2015-06-28 DIAGNOSIS — N186 End stage renal disease: Secondary | ICD-10-CM | POA: Diagnosis not present

## 2015-06-28 DIAGNOSIS — D509 Iron deficiency anemia, unspecified: Secondary | ICD-10-CM | POA: Diagnosis not present

## 2015-06-28 DIAGNOSIS — N2581 Secondary hyperparathyroidism of renal origin: Secondary | ICD-10-CM | POA: Diagnosis not present

## 2015-06-29 DIAGNOSIS — N2581 Secondary hyperparathyroidism of renal origin: Secondary | ICD-10-CM | POA: Diagnosis not present

## 2015-06-29 DIAGNOSIS — D509 Iron deficiency anemia, unspecified: Secondary | ICD-10-CM | POA: Diagnosis not present

## 2015-06-29 DIAGNOSIS — Z992 Dependence on renal dialysis: Secondary | ICD-10-CM | POA: Diagnosis not present

## 2015-06-29 DIAGNOSIS — N186 End stage renal disease: Secondary | ICD-10-CM | POA: Diagnosis not present

## 2015-06-30 DIAGNOSIS — N2581 Secondary hyperparathyroidism of renal origin: Secondary | ICD-10-CM | POA: Diagnosis not present

## 2015-06-30 DIAGNOSIS — Z992 Dependence on renal dialysis: Secondary | ICD-10-CM | POA: Diagnosis not present

## 2015-06-30 DIAGNOSIS — N186 End stage renal disease: Secondary | ICD-10-CM | POA: Diagnosis not present

## 2015-06-30 DIAGNOSIS — D509 Iron deficiency anemia, unspecified: Secondary | ICD-10-CM | POA: Diagnosis not present

## 2015-07-01 DIAGNOSIS — N2581 Secondary hyperparathyroidism of renal origin: Secondary | ICD-10-CM | POA: Diagnosis not present

## 2015-07-01 DIAGNOSIS — N186 End stage renal disease: Secondary | ICD-10-CM | POA: Diagnosis not present

## 2015-07-01 DIAGNOSIS — D509 Iron deficiency anemia, unspecified: Secondary | ICD-10-CM | POA: Diagnosis not present

## 2015-07-01 DIAGNOSIS — Z992 Dependence on renal dialysis: Secondary | ICD-10-CM | POA: Diagnosis not present

## 2015-07-02 DIAGNOSIS — N186 End stage renal disease: Secondary | ICD-10-CM | POA: Diagnosis not present

## 2015-07-02 DIAGNOSIS — D509 Iron deficiency anemia, unspecified: Secondary | ICD-10-CM | POA: Diagnosis not present

## 2015-07-02 DIAGNOSIS — N2581 Secondary hyperparathyroidism of renal origin: Secondary | ICD-10-CM | POA: Diagnosis not present

## 2015-07-02 DIAGNOSIS — Z992 Dependence on renal dialysis: Secondary | ICD-10-CM | POA: Diagnosis not present

## 2015-07-03 DIAGNOSIS — N2581 Secondary hyperparathyroidism of renal origin: Secondary | ICD-10-CM | POA: Diagnosis not present

## 2015-07-03 DIAGNOSIS — D509 Iron deficiency anemia, unspecified: Secondary | ICD-10-CM | POA: Diagnosis not present

## 2015-07-03 DIAGNOSIS — N186 End stage renal disease: Secondary | ICD-10-CM | POA: Diagnosis not present

## 2015-07-03 DIAGNOSIS — Z992 Dependence on renal dialysis: Secondary | ICD-10-CM | POA: Diagnosis not present

## 2015-07-06 DIAGNOSIS — N2581 Secondary hyperparathyroidism of renal origin: Secondary | ICD-10-CM | POA: Diagnosis not present

## 2015-07-06 DIAGNOSIS — Z992 Dependence on renal dialysis: Secondary | ICD-10-CM | POA: Diagnosis not present

## 2015-07-06 DIAGNOSIS — N186 End stage renal disease: Secondary | ICD-10-CM | POA: Diagnosis not present

## 2015-07-06 DIAGNOSIS — D509 Iron deficiency anemia, unspecified: Secondary | ICD-10-CM | POA: Diagnosis not present

## 2015-07-07 DIAGNOSIS — N2581 Secondary hyperparathyroidism of renal origin: Secondary | ICD-10-CM | POA: Diagnosis not present

## 2015-07-07 DIAGNOSIS — N186 End stage renal disease: Secondary | ICD-10-CM | POA: Diagnosis not present

## 2015-07-07 DIAGNOSIS — Z992 Dependence on renal dialysis: Secondary | ICD-10-CM | POA: Diagnosis not present

## 2015-07-07 DIAGNOSIS — D509 Iron deficiency anemia, unspecified: Secondary | ICD-10-CM | POA: Diagnosis not present

## 2015-07-10 DIAGNOSIS — N2581 Secondary hyperparathyroidism of renal origin: Secondary | ICD-10-CM | POA: Diagnosis not present

## 2015-07-10 DIAGNOSIS — N186 End stage renal disease: Secondary | ICD-10-CM | POA: Diagnosis not present

## 2015-07-10 DIAGNOSIS — Z992 Dependence on renal dialysis: Secondary | ICD-10-CM | POA: Diagnosis not present

## 2015-07-10 DIAGNOSIS — D509 Iron deficiency anemia, unspecified: Secondary | ICD-10-CM | POA: Diagnosis not present

## 2015-07-11 DIAGNOSIS — Z992 Dependence on renal dialysis: Secondary | ICD-10-CM | POA: Diagnosis not present

## 2015-07-11 DIAGNOSIS — N2581 Secondary hyperparathyroidism of renal origin: Secondary | ICD-10-CM | POA: Diagnosis not present

## 2015-07-11 DIAGNOSIS — D509 Iron deficiency anemia, unspecified: Secondary | ICD-10-CM | POA: Diagnosis not present

## 2015-07-11 DIAGNOSIS — N186 End stage renal disease: Secondary | ICD-10-CM | POA: Diagnosis not present

## 2015-07-12 DIAGNOSIS — D509 Iron deficiency anemia, unspecified: Secondary | ICD-10-CM | POA: Diagnosis not present

## 2015-07-12 DIAGNOSIS — N2581 Secondary hyperparathyroidism of renal origin: Secondary | ICD-10-CM | POA: Diagnosis not present

## 2015-07-12 DIAGNOSIS — Z992 Dependence on renal dialysis: Secondary | ICD-10-CM | POA: Diagnosis not present

## 2015-07-12 DIAGNOSIS — N186 End stage renal disease: Secondary | ICD-10-CM | POA: Diagnosis not present

## 2015-07-13 DIAGNOSIS — Z992 Dependence on renal dialysis: Secondary | ICD-10-CM | POA: Diagnosis not present

## 2015-07-13 DIAGNOSIS — N2581 Secondary hyperparathyroidism of renal origin: Secondary | ICD-10-CM | POA: Diagnosis not present

## 2015-07-13 DIAGNOSIS — D509 Iron deficiency anemia, unspecified: Secondary | ICD-10-CM | POA: Diagnosis not present

## 2015-07-13 DIAGNOSIS — N186 End stage renal disease: Secondary | ICD-10-CM | POA: Diagnosis not present

## 2015-07-14 DIAGNOSIS — N2581 Secondary hyperparathyroidism of renal origin: Secondary | ICD-10-CM | POA: Diagnosis not present

## 2015-07-14 DIAGNOSIS — N186 End stage renal disease: Secondary | ICD-10-CM | POA: Diagnosis not present

## 2015-07-14 DIAGNOSIS — Z992 Dependence on renal dialysis: Secondary | ICD-10-CM | POA: Diagnosis not present

## 2015-07-14 DIAGNOSIS — D509 Iron deficiency anemia, unspecified: Secondary | ICD-10-CM | POA: Diagnosis not present

## 2015-07-15 DIAGNOSIS — D509 Iron deficiency anemia, unspecified: Secondary | ICD-10-CM | POA: Diagnosis not present

## 2015-07-15 DIAGNOSIS — N2581 Secondary hyperparathyroidism of renal origin: Secondary | ICD-10-CM | POA: Diagnosis not present

## 2015-07-15 DIAGNOSIS — N186 End stage renal disease: Secondary | ICD-10-CM | POA: Diagnosis not present

## 2015-07-15 DIAGNOSIS — Z992 Dependence on renal dialysis: Secondary | ICD-10-CM | POA: Diagnosis not present

## 2015-07-16 DIAGNOSIS — Z992 Dependence on renal dialysis: Secondary | ICD-10-CM | POA: Diagnosis not present

## 2015-07-16 DIAGNOSIS — N186 End stage renal disease: Secondary | ICD-10-CM | POA: Diagnosis not present

## 2015-07-16 DIAGNOSIS — N2581 Secondary hyperparathyroidism of renal origin: Secondary | ICD-10-CM | POA: Diagnosis not present

## 2015-07-16 DIAGNOSIS — D509 Iron deficiency anemia, unspecified: Secondary | ICD-10-CM | POA: Diagnosis not present

## 2015-07-17 DIAGNOSIS — N186 End stage renal disease: Secondary | ICD-10-CM | POA: Diagnosis not present

## 2015-07-17 DIAGNOSIS — D509 Iron deficiency anemia, unspecified: Secondary | ICD-10-CM | POA: Diagnosis not present

## 2015-07-17 DIAGNOSIS — N2581 Secondary hyperparathyroidism of renal origin: Secondary | ICD-10-CM | POA: Diagnosis not present

## 2015-07-17 DIAGNOSIS — Z992 Dependence on renal dialysis: Secondary | ICD-10-CM | POA: Diagnosis not present

## 2015-07-18 DIAGNOSIS — Z992 Dependence on renal dialysis: Secondary | ICD-10-CM | POA: Diagnosis not present

## 2015-07-18 DIAGNOSIS — N2581 Secondary hyperparathyroidism of renal origin: Secondary | ICD-10-CM | POA: Diagnosis not present

## 2015-07-18 DIAGNOSIS — N186 End stage renal disease: Secondary | ICD-10-CM | POA: Diagnosis not present

## 2015-07-18 DIAGNOSIS — D509 Iron deficiency anemia, unspecified: Secondary | ICD-10-CM | POA: Diagnosis not present

## 2015-07-19 DIAGNOSIS — N186 End stage renal disease: Secondary | ICD-10-CM | POA: Diagnosis not present

## 2015-07-19 DIAGNOSIS — N2581 Secondary hyperparathyroidism of renal origin: Secondary | ICD-10-CM | POA: Diagnosis not present

## 2015-07-19 DIAGNOSIS — D509 Iron deficiency anemia, unspecified: Secondary | ICD-10-CM | POA: Diagnosis not present

## 2015-07-19 DIAGNOSIS — Z992 Dependence on renal dialysis: Secondary | ICD-10-CM | POA: Diagnosis not present

## 2015-07-20 DIAGNOSIS — D509 Iron deficiency anemia, unspecified: Secondary | ICD-10-CM | POA: Diagnosis not present

## 2015-07-20 DIAGNOSIS — Z992 Dependence on renal dialysis: Secondary | ICD-10-CM | POA: Diagnosis not present

## 2015-07-20 DIAGNOSIS — N2581 Secondary hyperparathyroidism of renal origin: Secondary | ICD-10-CM | POA: Diagnosis not present

## 2015-07-20 DIAGNOSIS — N186 End stage renal disease: Secondary | ICD-10-CM | POA: Diagnosis not present

## 2015-07-21 ENCOUNTER — Other Ambulatory Visit (HOSPITAL_COMMUNITY): Payer: Self-pay | Admitting: Nephrology

## 2015-07-21 DIAGNOSIS — N186 End stage renal disease: Secondary | ICD-10-CM

## 2015-07-21 DIAGNOSIS — Z992 Dependence on renal dialysis: Secondary | ICD-10-CM | POA: Diagnosis not present

## 2015-07-21 DIAGNOSIS — D509 Iron deficiency anemia, unspecified: Secondary | ICD-10-CM | POA: Diagnosis not present

## 2015-07-21 DIAGNOSIS — N2581 Secondary hyperparathyroidism of renal origin: Secondary | ICD-10-CM | POA: Diagnosis not present

## 2015-07-22 DIAGNOSIS — D509 Iron deficiency anemia, unspecified: Secondary | ICD-10-CM | POA: Diagnosis not present

## 2015-07-22 DIAGNOSIS — N2581 Secondary hyperparathyroidism of renal origin: Secondary | ICD-10-CM | POA: Diagnosis not present

## 2015-07-22 DIAGNOSIS — Z992 Dependence on renal dialysis: Secondary | ICD-10-CM | POA: Diagnosis not present

## 2015-07-22 DIAGNOSIS — N186 End stage renal disease: Secondary | ICD-10-CM | POA: Diagnosis not present

## 2015-07-23 ENCOUNTER — Other Ambulatory Visit: Payer: Self-pay | Admitting: Radiology

## 2015-07-23 DIAGNOSIS — N2581 Secondary hyperparathyroidism of renal origin: Secondary | ICD-10-CM | POA: Diagnosis not present

## 2015-07-23 DIAGNOSIS — N186 End stage renal disease: Secondary | ICD-10-CM | POA: Diagnosis not present

## 2015-07-23 DIAGNOSIS — Z992 Dependence on renal dialysis: Secondary | ICD-10-CM | POA: Diagnosis not present

## 2015-07-23 DIAGNOSIS — D509 Iron deficiency anemia, unspecified: Secondary | ICD-10-CM | POA: Diagnosis not present

## 2015-07-24 ENCOUNTER — Other Ambulatory Visit: Payer: Self-pay | Admitting: Radiology

## 2015-07-24 DIAGNOSIS — N2581 Secondary hyperparathyroidism of renal origin: Secondary | ICD-10-CM | POA: Diagnosis not present

## 2015-07-24 DIAGNOSIS — Z992 Dependence on renal dialysis: Secondary | ICD-10-CM | POA: Diagnosis not present

## 2015-07-24 DIAGNOSIS — D509 Iron deficiency anemia, unspecified: Secondary | ICD-10-CM | POA: Diagnosis not present

## 2015-07-24 DIAGNOSIS — N186 End stage renal disease: Secondary | ICD-10-CM | POA: Diagnosis not present

## 2015-07-25 ENCOUNTER — Inpatient Hospital Stay (HOSPITAL_COMMUNITY): Admission: RE | Admit: 2015-07-25 | Payer: Self-pay | Source: Ambulatory Visit

## 2015-07-25 DIAGNOSIS — N2581 Secondary hyperparathyroidism of renal origin: Secondary | ICD-10-CM | POA: Diagnosis not present

## 2015-07-25 DIAGNOSIS — D509 Iron deficiency anemia, unspecified: Secondary | ICD-10-CM | POA: Diagnosis not present

## 2015-07-25 DIAGNOSIS — Z992 Dependence on renal dialysis: Secondary | ICD-10-CM | POA: Diagnosis not present

## 2015-07-25 DIAGNOSIS — N186 End stage renal disease: Secondary | ICD-10-CM | POA: Diagnosis not present

## 2015-07-26 DIAGNOSIS — D509 Iron deficiency anemia, unspecified: Secondary | ICD-10-CM | POA: Diagnosis not present

## 2015-07-26 DIAGNOSIS — Z992 Dependence on renal dialysis: Secondary | ICD-10-CM | POA: Diagnosis not present

## 2015-07-26 DIAGNOSIS — N2581 Secondary hyperparathyroidism of renal origin: Secondary | ICD-10-CM | POA: Diagnosis not present

## 2015-07-26 DIAGNOSIS — N186 End stage renal disease: Secondary | ICD-10-CM | POA: Diagnosis not present

## 2015-07-27 DIAGNOSIS — N2581 Secondary hyperparathyroidism of renal origin: Secondary | ICD-10-CM | POA: Diagnosis not present

## 2015-07-27 DIAGNOSIS — Z992 Dependence on renal dialysis: Secondary | ICD-10-CM | POA: Diagnosis not present

## 2015-07-27 DIAGNOSIS — N186 End stage renal disease: Secondary | ICD-10-CM | POA: Diagnosis not present

## 2015-07-27 DIAGNOSIS — D509 Iron deficiency anemia, unspecified: Secondary | ICD-10-CM | POA: Diagnosis not present

## 2015-07-28 DIAGNOSIS — Z992 Dependence on renal dialysis: Secondary | ICD-10-CM | POA: Diagnosis not present

## 2015-07-28 DIAGNOSIS — D509 Iron deficiency anemia, unspecified: Secondary | ICD-10-CM | POA: Diagnosis not present

## 2015-07-28 DIAGNOSIS — N186 End stage renal disease: Secondary | ICD-10-CM | POA: Diagnosis not present

## 2015-07-28 DIAGNOSIS — N2581 Secondary hyperparathyroidism of renal origin: Secondary | ICD-10-CM | POA: Diagnosis not present

## 2015-07-29 DIAGNOSIS — D509 Iron deficiency anemia, unspecified: Secondary | ICD-10-CM | POA: Diagnosis not present

## 2015-07-29 DIAGNOSIS — N2581 Secondary hyperparathyroidism of renal origin: Secondary | ICD-10-CM | POA: Diagnosis not present

## 2015-07-29 DIAGNOSIS — Z992 Dependence on renal dialysis: Secondary | ICD-10-CM | POA: Diagnosis not present

## 2015-07-29 DIAGNOSIS — N186 End stage renal disease: Secondary | ICD-10-CM | POA: Diagnosis not present

## 2015-07-30 DIAGNOSIS — Z992 Dependence on renal dialysis: Secondary | ICD-10-CM | POA: Diagnosis not present

## 2015-07-30 DIAGNOSIS — D509 Iron deficiency anemia, unspecified: Secondary | ICD-10-CM | POA: Diagnosis not present

## 2015-07-30 DIAGNOSIS — N2581 Secondary hyperparathyroidism of renal origin: Secondary | ICD-10-CM | POA: Diagnosis not present

## 2015-07-30 DIAGNOSIS — N186 End stage renal disease: Secondary | ICD-10-CM | POA: Diagnosis not present

## 2015-07-31 DIAGNOSIS — N186 End stage renal disease: Secondary | ICD-10-CM | POA: Diagnosis not present

## 2015-07-31 DIAGNOSIS — N2581 Secondary hyperparathyroidism of renal origin: Secondary | ICD-10-CM | POA: Diagnosis not present

## 2015-07-31 DIAGNOSIS — Z992 Dependence on renal dialysis: Secondary | ICD-10-CM | POA: Diagnosis not present

## 2015-07-31 DIAGNOSIS — D509 Iron deficiency anemia, unspecified: Secondary | ICD-10-CM | POA: Diagnosis not present

## 2015-08-01 DIAGNOSIS — N186 End stage renal disease: Secondary | ICD-10-CM | POA: Diagnosis not present

## 2015-08-01 DIAGNOSIS — Z992 Dependence on renal dialysis: Secondary | ICD-10-CM | POA: Diagnosis not present

## 2015-08-01 DIAGNOSIS — N2581 Secondary hyperparathyroidism of renal origin: Secondary | ICD-10-CM | POA: Diagnosis not present

## 2015-08-01 DIAGNOSIS — D509 Iron deficiency anemia, unspecified: Secondary | ICD-10-CM | POA: Diagnosis not present

## 2015-08-02 DIAGNOSIS — D509 Iron deficiency anemia, unspecified: Secondary | ICD-10-CM | POA: Diagnosis not present

## 2015-08-02 DIAGNOSIS — N2581 Secondary hyperparathyroidism of renal origin: Secondary | ICD-10-CM | POA: Diagnosis not present

## 2015-08-02 DIAGNOSIS — Z992 Dependence on renal dialysis: Secondary | ICD-10-CM | POA: Diagnosis not present

## 2015-08-02 DIAGNOSIS — N186 End stage renal disease: Secondary | ICD-10-CM | POA: Diagnosis not present

## 2015-08-03 DIAGNOSIS — N2581 Secondary hyperparathyroidism of renal origin: Secondary | ICD-10-CM | POA: Diagnosis not present

## 2015-08-03 DIAGNOSIS — D509 Iron deficiency anemia, unspecified: Secondary | ICD-10-CM | POA: Diagnosis not present

## 2015-08-03 DIAGNOSIS — N186 End stage renal disease: Secondary | ICD-10-CM | POA: Diagnosis not present

## 2015-08-03 DIAGNOSIS — Z992 Dependence on renal dialysis: Secondary | ICD-10-CM | POA: Diagnosis not present

## 2015-08-04 DIAGNOSIS — Z992 Dependence on renal dialysis: Secondary | ICD-10-CM | POA: Diagnosis not present

## 2015-08-04 DIAGNOSIS — N2581 Secondary hyperparathyroidism of renal origin: Secondary | ICD-10-CM | POA: Diagnosis not present

## 2015-08-04 DIAGNOSIS — D509 Iron deficiency anemia, unspecified: Secondary | ICD-10-CM | POA: Diagnosis not present

## 2015-08-04 DIAGNOSIS — N186 End stage renal disease: Secondary | ICD-10-CM | POA: Diagnosis not present

## 2015-08-05 ENCOUNTER — Encounter (HOSPITAL_COMMUNITY): Payer: Self-pay | Admitting: Emergency Medicine

## 2015-08-05 ENCOUNTER — Emergency Department (HOSPITAL_COMMUNITY)
Admission: EM | Admit: 2015-08-05 | Discharge: 2015-08-05 | Disposition: A | Discharge status: Home / Self Care | Payer: Medicare Other | Attending: Emergency Medicine | Admitting: Emergency Medicine

## 2015-08-05 DIAGNOSIS — Z79899 Other long term (current) drug therapy: Secondary | ICD-10-CM | POA: Diagnosis not present

## 2015-08-05 DIAGNOSIS — Y9289 Other specified places as the place of occurrence of the external cause: Secondary | ICD-10-CM | POA: Insufficient documentation

## 2015-08-05 DIAGNOSIS — R51 Headache: Secondary | ICD-10-CM | POA: Diagnosis present

## 2015-08-05 DIAGNOSIS — N186 End stage renal disease: Secondary | ICD-10-CM | POA: Diagnosis not present

## 2015-08-05 DIAGNOSIS — D509 Iron deficiency anemia, unspecified: Secondary | ICD-10-CM | POA: Diagnosis not present

## 2015-08-05 DIAGNOSIS — G43809 Other migraine, not intractable, without status migrainosus: Secondary | ICD-10-CM | POA: Diagnosis not present

## 2015-08-05 DIAGNOSIS — N2581 Secondary hyperparathyroidism of renal origin: Secondary | ICD-10-CM | POA: Diagnosis not present

## 2015-08-05 DIAGNOSIS — Z992 Dependence on renal dialysis: Secondary | ICD-10-CM | POA: Diagnosis not present

## 2015-08-05 DIAGNOSIS — S39012A Strain of muscle, fascia and tendon of lower back, initial encounter: Secondary | ICD-10-CM

## 2015-08-05 DIAGNOSIS — Y939 Activity, unspecified: Secondary | ICD-10-CM | POA: Diagnosis not present

## 2015-08-05 DIAGNOSIS — Y998 Other external cause status: Secondary | ICD-10-CM | POA: Insufficient documentation

## 2015-08-05 DIAGNOSIS — X58XXXA Exposure to other specified factors, initial encounter: Secondary | ICD-10-CM | POA: Insufficient documentation

## 2015-08-05 MED ORDER — DEXAMETHASONE SODIUM PHOSPHATE 10 MG/ML IJ SOLN
10.0000 mg | Freq: Once | INTRAMUSCULAR | Status: AC
  Administered 2015-08-05: 10 mg via INTRAVENOUS
  Filled 2015-08-05: qty 1

## 2015-08-05 MED ORDER — METOCLOPRAMIDE HCL 5 MG/ML IJ SOLN
10.0000 mg | Freq: Once | INTRAMUSCULAR | Status: AC
  Administered 2015-08-05: 10 mg via INTRAVENOUS
  Filled 2015-08-05: qty 2

## 2015-08-05 MED ORDER — SODIUM CHLORIDE 0.9 % IV BOLUS (SEPSIS)
1000.0000 mL | Freq: Once | INTRAVENOUS | Status: AC
  Administered 2015-08-05: 1000 mL via INTRAVENOUS

## 2015-08-05 MED ORDER — DIPHENHYDRAMINE HCL 50 MG/ML IJ SOLN
50.0000 mg | Freq: Once | INTRAMUSCULAR | Status: AC
  Administered 2015-08-05: 50 mg via INTRAVENOUS
  Filled 2015-08-05: qty 1

## 2015-08-05 NOTE — ED Notes (Signed)
Patient states has headache to back of head.  Patient states "sometimes I have arm pain and leg pain, but I take Lyrica".  Patient states he has had trouble with pain for a year.  Patient states he has a constant hip pain..   Patient states head pain has resolved right now, but it comes and goes.

## 2015-08-05 NOTE — ED Notes (Signed)
Pt. Received 500 ml NS. Per MD d/c fluids because of renal function.

## 2015-08-05 NOTE — ED Provider Notes (Signed)
CSN: [id number]     Arrival date & time 08/05/15  1342 History   First MD Initiated Contact with Patient 08/05/15 1644     Chief Complaint  Patient presents with  . Headache   Patient is a 49 y.o. male presenting with general illness. The history is provided by the patient. No language interpreter was used.  Illness Location:  Head Quality:  Pain Severity:  Moderate Onset quality:  Gradual Timing:  Constant Progression:  Worsening Chronicity:  New Context:  ESRD presenting with a headache. Persistent for past 3 days. Starting in neck and radiating forward over her occiput. Associated with photophobia and phonophobia. Patient denies vision changes, or focal weakness numbness or tingling. Patient also notes aching in left lower back radiating down left leg. Patient denies previous history of this. No known trauma to lower back leg. Associated symptoms: headaches   Associated symptoms: no abdominal pain, no chest pain, no cough, no diarrhea, no fever, no loss of consciousness, no nausea, no shortness of breath, no sore throat and no vomiting     History reviewed. No pertinent past medical history. History reviewed. No pertinent past surgical history. No family history on file. Social History  Substance Use Topics  . Smoking status: None  . Smokeless tobacco: None  . Alcohol Use: No    Review of Systems  Constitutional: Negative for fever.  HENT: Negative for sore throat.   Eyes: Positive for photophobia.  Respiratory: Negative for cough and shortness of breath.   Cardiovascular: Negative for chest pain.  Gastrointestinal: Negative for nausea, vomiting, abdominal pain and diarrhea.  Neurological: Positive for headaches. Negative for dizziness, tremors, seizures, loss of consciousness, syncope, facial asymmetry, speech difficulty, weakness, light-headedness and numbness.  All other systems reviewed and are negative.   Allergies  Allegra and Eggs or egg-derived products  Home  Medications   Prior to Admission medications   Medication Sig Start Date End Date Taking? Authorizing Provider  DULoxetine (CYMBALTA) 30 MG capsule Take 30 mg by mouth daily. 05/26/15  Yes Historical Provider, MD  folic acid (FOLVITE) 1 MG tablet Take 1 mg by mouth daily. 05/26/15  Yes Historical Provider, MD  FOSRENOL 1000 MG chewable tablet Chew 1,000 mg by mouth 4 (four) times daily. 07/20/15  Yes Historical Provider, MD  gabapentin (NEURONTIN) 300 MG capsule Take 300 mg by mouth 2 (two) times daily. 06/03/15  Yes Historical Provider, MD  TOPROL XL 25 MG 24 hr tablet Take 25 mg by mouth daily. 05/26/15  Yes Historical Provider, MD   BP 135/72 mmHg  Pulse 92  Temp(Src) 98.4 F (36.9 C) (Oral)  Resp 18  Ht  (1.727 m)  Wt 220 lb (99.791 kg)  BMI 33.46 kg/m2  SpO2 99%   Physical Exam  Constitutional: He is oriented to person, place, and time. He appears well-developed and well-nourished. No distress.  HENT:  Head: Normocephalic and atraumatic.  Eyes: Conjunctivae are normal. Pupils are equal, round, and reactive to light.  Neck: Normal range of motion. Neck supple.  Cardiovascular: Intact distal pulses.   HR to low 100's improved after IVF's  Pulmonary/Chest: Effort normal and breath sounds normal. No respiratory distress. He has no wheezes.  Abdominal: Soft. Bowel sounds are normal. He exhibits no distension. There is no tenderness. There is no rebound.  Musculoskeletal: Normal range of motion.  Neurological: He is alert and oriented to person, place, and time. He displays normal reflexes. No cranial nerve deficit. He exhibits normal muscle tone. Coordination  normal.  Skin: Skin is warm and dry. He is not diaphoretic.    ED Course  Procedures   Labs Review Labs Reviewed - No data to display  Imaging Review No results found. I have personally reviewed and evaluated these images and lab results as part of my medical decision-making.   EKG Interpretation None      MDM   Mr. Romeo Apple is a 49 year old male with past history of ESRD presenting with a HA. Persistent for past 3 days. Starting in occiput and radiating forward. Associated with photophobia and phonophobia. Patient denies vision changes, or focal weakness numbness or tingling. Patient also notes aching in left lower back radiating down left leg. Patient denies previous history of this. No known trauma to lower back leg.  Exam above notable for middle-aged male lying in bed in no acute distress. Afebrile. Heart rate 90s. Not tachypneic. Breathing on room air without supplemental oxygen. Normotensive. Neuro exam nonfocal. No signs of meningismus.  Symptoms consistent with mixed tension/migraine headache physiology. No alarm symptoms. Patient received migraine cocktail consisting of IV fluids, IV Reglan, IV Benadryl, and IV Decadron with near complete resolution of headache.  Presentation consistent with nonspecific headache as well as lumbar strain. Patient discharged home in stable condition with follow-up with his PCP for lumbar strain. Patient given strict return precautions. Patient understands and agrees with plan and has no further questions or concerns.   patient care discussed with and followed by my attending Dr. Jeraldine Loots.   Final diagnoses:  Other migraine without status migrainosus, not intractable  Lumbar strain, initial encounter    Angelina Ok, MD 08/06/15 0139  Gerhard Munch, MD 08/06/15 1911

## 2015-08-06 DIAGNOSIS — N2581 Secondary hyperparathyroidism of renal origin: Secondary | ICD-10-CM | POA: Diagnosis not present

## 2015-08-06 DIAGNOSIS — D509 Iron deficiency anemia, unspecified: Secondary | ICD-10-CM | POA: Diagnosis not present

## 2015-08-06 DIAGNOSIS — N186 End stage renal disease: Secondary | ICD-10-CM | POA: Diagnosis not present

## 2015-08-06 DIAGNOSIS — Z992 Dependence on renal dialysis: Secondary | ICD-10-CM | POA: Diagnosis not present

## 2015-08-07 DIAGNOSIS — D631 Anemia in chronic kidney disease: Secondary | ICD-10-CM | POA: Diagnosis not present

## 2015-08-07 DIAGNOSIS — Z23 Encounter for immunization: Secondary | ICD-10-CM | POA: Diagnosis not present

## 2015-08-07 DIAGNOSIS — Z992 Dependence on renal dialysis: Secondary | ICD-10-CM | POA: Diagnosis not present

## 2015-08-07 DIAGNOSIS — D509 Iron deficiency anemia, unspecified: Secondary | ICD-10-CM | POA: Diagnosis not present

## 2015-08-07 DIAGNOSIS — N186 End stage renal disease: Secondary | ICD-10-CM | POA: Diagnosis not present

## 2015-08-07 DIAGNOSIS — N2581 Secondary hyperparathyroidism of renal origin: Secondary | ICD-10-CM | POA: Diagnosis not present

## 2015-08-08 DIAGNOSIS — D631 Anemia in chronic kidney disease: Secondary | ICD-10-CM | POA: Diagnosis not present

## 2015-08-08 DIAGNOSIS — N186 End stage renal disease: Secondary | ICD-10-CM | POA: Diagnosis not present

## 2015-08-08 DIAGNOSIS — Z23 Encounter for immunization: Secondary | ICD-10-CM | POA: Diagnosis not present

## 2015-08-08 DIAGNOSIS — R51 Headache: Secondary | ICD-10-CM | POA: Diagnosis not present

## 2015-08-08 DIAGNOSIS — Z418 Encounter for other procedures for purposes other than remedying health state: Secondary | ICD-10-CM | POA: Diagnosis not present

## 2015-08-08 DIAGNOSIS — N2581 Secondary hyperparathyroidism of renal origin: Secondary | ICD-10-CM | POA: Diagnosis not present

## 2015-08-08 DIAGNOSIS — E039 Hypothyroidism, unspecified: Secondary | ICD-10-CM | POA: Diagnosis not present

## 2015-08-08 DIAGNOSIS — N19 Unspecified kidney failure: Secondary | ICD-10-CM | POA: Diagnosis not present

## 2015-08-08 DIAGNOSIS — D509 Iron deficiency anemia, unspecified: Secondary | ICD-10-CM | POA: Diagnosis not present

## 2015-08-08 DIAGNOSIS — Z992 Dependence on renal dialysis: Secondary | ICD-10-CM | POA: Diagnosis not present

## 2015-08-09 DIAGNOSIS — Z23 Encounter for immunization: Secondary | ICD-10-CM | POA: Diagnosis not present

## 2015-08-09 DIAGNOSIS — Z992 Dependence on renal dialysis: Secondary | ICD-10-CM | POA: Diagnosis not present

## 2015-08-09 DIAGNOSIS — N2581 Secondary hyperparathyroidism of renal origin: Secondary | ICD-10-CM | POA: Diagnosis not present

## 2015-08-09 DIAGNOSIS — D631 Anemia in chronic kidney disease: Secondary | ICD-10-CM | POA: Diagnosis not present

## 2015-08-09 DIAGNOSIS — N186 End stage renal disease: Secondary | ICD-10-CM | POA: Diagnosis not present

## 2015-08-09 DIAGNOSIS — D509 Iron deficiency anemia, unspecified: Secondary | ICD-10-CM | POA: Diagnosis not present

## 2015-08-10 ENCOUNTER — Emergency Department (HOSPITAL_COMMUNITY): Payer: Medicare Other

## 2015-08-10 ENCOUNTER — Other Ambulatory Visit: Payer: Self-pay

## 2015-08-10 ENCOUNTER — Inpatient Hospital Stay (HOSPITAL_COMMUNITY)
Admission: EM | Admit: 2015-08-10 | Discharge: 2015-08-15 | DRG: 871 | Disposition: A | Discharge status: Home Health | Payer: Medicare Other | Attending: Internal Medicine | Admitting: Internal Medicine

## 2015-08-10 ENCOUNTER — Encounter (HOSPITAL_COMMUNITY): Payer: Self-pay | Admitting: Emergency Medicine

## 2015-08-10 ENCOUNTER — Other Ambulatory Visit (HOSPITAL_COMMUNITY): Payer: Self-pay

## 2015-08-10 DIAGNOSIS — J329 Chronic sinusitis, unspecified: Secondary | ICD-10-CM | POA: Diagnosis present

## 2015-08-10 DIAGNOSIS — Z992 Dependence on renal dialysis: Secondary | ICD-10-CM | POA: Diagnosis not present

## 2015-08-10 DIAGNOSIS — Z91012 Allergy to eggs: Secondary | ICD-10-CM

## 2015-08-10 DIAGNOSIS — N186 End stage renal disease: Secondary | ICD-10-CM | POA: Diagnosis present

## 2015-08-10 DIAGNOSIS — R509 Fever, unspecified: Secondary | ICD-10-CM | POA: Diagnosis not present

## 2015-08-10 DIAGNOSIS — G9341 Metabolic encephalopathy: Secondary | ICD-10-CM | POA: Diagnosis present

## 2015-08-10 DIAGNOSIS — G4733 Obstructive sleep apnea (adult) (pediatric): Secondary | ICD-10-CM | POA: Diagnosis present

## 2015-08-10 DIAGNOSIS — R51 Headache: Secondary | ICD-10-CM

## 2015-08-10 DIAGNOSIS — R748 Abnormal levels of other serum enzymes: Secondary | ICD-10-CM | POA: Diagnosis present

## 2015-08-10 DIAGNOSIS — I120 Hypertensive chronic kidney disease with stage 5 chronic kidney disease or end stage renal disease: Secondary | ICD-10-CM | POA: Diagnosis present

## 2015-08-10 DIAGNOSIS — I10 Essential (primary) hypertension: Secondary | ICD-10-CM | POA: Diagnosis not present

## 2015-08-10 DIAGNOSIS — N179 Acute kidney failure, unspecified: Secondary | ICD-10-CM | POA: Diagnosis not present

## 2015-08-10 DIAGNOSIS — Z888 Allergy status to other drugs, medicaments and biological substances status: Secondary | ICD-10-CM

## 2015-08-10 DIAGNOSIS — N19 Unspecified kidney failure: Secondary | ICD-10-CM | POA: Diagnosis not present

## 2015-08-10 DIAGNOSIS — R519 Headache, unspecified: Secondary | ICD-10-CM

## 2015-08-10 DIAGNOSIS — Z79899 Other long term (current) drug therapy: Secondary | ICD-10-CM

## 2015-08-10 DIAGNOSIS — D631 Anemia in chronic kidney disease: Secondary | ICD-10-CM | POA: Diagnosis present

## 2015-08-10 DIAGNOSIS — A419 Sepsis, unspecified organism: Principal | ICD-10-CM | POA: Diagnosis present

## 2015-08-10 DIAGNOSIS — F419 Anxiety disorder, unspecified: Secondary | ICD-10-CM | POA: Diagnosis not present

## 2015-08-10 DIAGNOSIS — R404 Transient alteration of awareness: Secondary | ICD-10-CM | POA: Diagnosis not present

## 2015-08-10 DIAGNOSIS — D72829 Elevated white blood cell count, unspecified: Secondary | ICD-10-CM | POA: Diagnosis not present

## 2015-08-10 DIAGNOSIS — R531 Weakness: Secondary | ICD-10-CM | POA: Diagnosis not present

## 2015-08-10 DIAGNOSIS — R4182 Altered mental status, unspecified: Secondary | ICD-10-CM | POA: Diagnosis not present

## 2015-08-10 HISTORY — DX: Polyneuropathy, unspecified: G62.9

## 2015-08-10 HISTORY — DX: Major depressive disorder, single episode, unspecified: F32.9

## 2015-08-10 HISTORY — DX: Essential (primary) hypertension: I10

## 2015-08-10 HISTORY — DX: Depression, unspecified: F32.A

## 2015-08-10 HISTORY — DX: Disorder of kidney and ureter, unspecified: N28.9

## 2015-08-10 HISTORY — DX: Dependence on renal dialysis: Z99.2

## 2015-08-10 LAB — BASIC METABOLIC PANEL
Anion gap: 20 — ABNORMAL HIGH (ref 5–15)
BUN: 78 mg/dL — ABNORMAL HIGH (ref 6–20)
CHLORIDE: 93 mmol/L — AB (ref 101–111)
CO2: 22 mmol/L (ref 22–32)
CREATININE: 24.07 mg/dL — AB (ref 0.61–1.24)
Calcium: 9.4 mg/dL (ref 8.9–10.3)
GFR calc non Af Amer: 2 mL/min — ABNORMAL LOW (ref >60)
GFR, EST AFRICAN AMERICAN: 2 mL/min — AB (ref >60)
Glucose, Bld: 92 mg/dL (ref 65–99)
POTASSIUM: 3.9 mmol/L (ref 3.5–5.1)
SODIUM: 135 mmol/L (ref 135–145)

## 2015-08-10 LAB — HEPATIC FUNCTION PANEL
ALBUMIN: 3.1 g/dL — AB (ref 3.5–5.0)
ALT: 14 U/L — AB (ref 17–63)
AST: 9 U/L — AB (ref 15–41)
Alkaline Phosphatase: 182 U/L — ABNORMAL HIGH (ref 38–126)
BILIRUBIN DIRECT: 0.1 mg/dL (ref 0.1–0.5)
Indirect Bilirubin: 1 mg/dL — ABNORMAL HIGH (ref 0.3–0.9)
TOTAL PROTEIN: 6.9 g/dL (ref 6.5–8.1)
Total Bilirubin: 1.1 mg/dL (ref 0.3–1.2)

## 2015-08-10 LAB — CBC
HEMATOCRIT: 35.3 % — AB (ref 39.0–52.0)
HEMOGLOBIN: 11.4 g/dL — AB (ref 13.0–17.0)
MCH: 27.7 pg (ref 26.0–34.0)
MCHC: 32.3 g/dL (ref 30.0–36.0)
MCV: 85.7 fL (ref 78.0–100.0)
Platelets: 364 10*3/uL (ref 150–400)
RBC: 4.12 MIL/uL — AB (ref 4.22–5.81)
RDW: 15.8 % — ABNORMAL HIGH (ref 11.5–15.5)
WBC: 18.5 10*3/uL — AB (ref 4.0–10.5)

## 2015-08-10 LAB — CBG MONITORING, ED: Glucose-Capillary: 97 mg/dL (ref 65–99)

## 2015-08-10 LAB — I-STAT CG4 LACTIC ACID, ED
LACTIC ACID, VENOUS: 0.52 mmol/L (ref 0.5–2.0)
Lactic Acid, Venous: 0.86 mmol/L (ref 0.5–2.0)

## 2015-08-10 MED ORDER — PIPERACILLIN-TAZOBACTAM IN DEX 2-0.25 GM/50ML IV SOLN
2.2500 g | Freq: Three times a day (TID) | INTRAVENOUS | Status: DC
  Administered 2015-08-11 – 2015-08-14 (×10): 2.25 g via INTRAVENOUS
  Filled 2015-08-10 (×13): qty 50

## 2015-08-10 MED ORDER — VANCOMYCIN HCL IN DEXTROSE 1-5 GM/200ML-% IV SOLN: 1000.0000 mg | Freq: Once | INTRAVENOUS | Status: DC

## 2015-08-10 MED ORDER — MECLIZINE HCL 25 MG PO TABS
25.0000 mg | ORAL_TABLET | Freq: Once | ORAL | Status: DC
Start: 2015-08-10 — End: 2015-08-15

## 2015-08-10 MED ORDER — SODIUM CHLORIDE 0.9 % IV SOLN
1500.0000 mg | Freq: Once | INTRAVENOUS | Status: AC
  Administered 2015-08-10: 1500 mg via INTRAVENOUS
  Filled 2015-08-10: qty 1500

## 2015-08-10 MED ORDER — PIPERACILLIN-TAZOBACTAM 3.375 G IVPB 30 MIN
3.3750 g | Freq: Once | INTRAVENOUS | Status: AC
  Administered 2015-08-10: 3.375 g via INTRAVENOUS
  Filled 2015-08-10: qty 50

## 2015-08-10 MED ORDER — HYDROXYZINE HCL 25 MG PO TABS
25.0000 mg | ORAL_TABLET | Freq: Once | ORAL | Status: AC
  Administered 2015-08-10: 25 mg via ORAL
  Filled 2015-08-10: qty 1

## 2015-08-10 MED ORDER — VANCOMYCIN HCL 10 G IV SOLR
2000.0000 mg | Freq: Once | INTRAVENOUS | Status: DC
  Filled 2015-08-10: qty 2000

## 2015-08-10 MED ORDER — ACETAMINOPHEN 500 MG PO TABS
1000.0000 mg | ORAL_TABLET | Freq: Once | ORAL | Status: AC
  Administered 2015-08-10: 1000 mg via ORAL
  Filled 2015-08-10: qty 2

## 2015-08-10 MED ORDER — MECLIZINE HCL 12.5 MG PO TABS
ORAL_TABLET | ORAL | Status: AC
  Filled 2015-08-10: qty 2

## 2015-08-10 NOTE — H&P (Signed)
PCP:   No primary care provider on file.   Chief Complaint:  confused  HPI: 49 yo male peritoneal dialysis brought in by his son and daughter for concerns of confusion.  Pt lives alone, but children report they dont think he really does his peritoneal dialysis at night like he is suppose too.  He has been confused over the last several days.  He has not mentioned any problems, no n/v/d.  Pt denies anything right now but is delirious so history unreliable.  His vitals are stable, pt responsive but falls asleep easily.  Referred for admission for encephalopathy with fever/ams.   Review of Systems:  Positive and negative as per HPI otherwise all other systems are negative per family, unobtainable from patient due to ams  Past Medical History: Past Medical History  Diagnosis Date  . Renal disorder   . Hypertension   . Peritoneal dialysis status   . Depression   . Neuropathy    History reviewed. No pertinent past surgical history.  Medications: Prior to Admission medications   Medication Sig Start Date End Date Taking? Authorizing Provider  baclofen (LIORESAL) 10 MG tablet Take 10 mg by mouth daily as needed for muscle spasms.  08/08/15  Yes Historical Provider, MD  DULoxetine (CYMBALTA) 30 MG capsule Take 30 mg by mouth daily. 05/26/15  Yes Historical Provider, MD  folic acid (FOLVITE) 1 MG tablet Take 1 mg by mouth daily. 05/26/15  Yes Historical Provider, MD  FOSRENOL 1000 MG chewable tablet Chew 1,000 mg by mouth 4 (four) times daily.  with snacks 07/20/15  Yes Historical Provider, MD  gabapentin (NEURONTIN) 300 MG capsule Take 300 mg by mouth 2 (two) times daily. 06/03/15  Yes Historical Provider, MD  TOPROL XL 25 MG 24 hr tablet Take 25 mg by mouth daily. 05/26/15  Yes Historical Provider, MD    Allergies:   Allergies  Allergen Reactions  . Allegra [Fexofenadine] Other (See Comments)    hallucinations  . Diphenhydramine Other (See Comments)    mental status change  . Eggs  Or Egg-Derived Products Nausea And Vomiting    Social History:  reports that he has never smoked. He does not have any smokeless tobacco history on file. He reports that he does not drink alcohol or use illicit drugs.  Family History: No dialysis  Physical Exam: Filed Vitals:   08/10/15 1900 08/10/15 1925 08/10/15 2008 08/10/15 2103  BP: 146/92   141/78  Pulse: 113   112  Temp:   101.6 F (38.7 C)   TempSrc:      Resp:    20  Height:      Weight:  99.791 kg (220 lb)    SpO2: 93%   99%   General appearance: alert, cooperative, delirious and no distress Head: Normocephalic, without obvious abnormality, atraumatic Eyes: negative Nose: Nares normal. Septum midline. Mucosa normal. No drainage or sinus tenderness. Neck: no JVD and supple, symmetrical, trachea midline Lungs: clear to auscultation bilaterally Heart: regular rate and rhythm, S1, S2 normal, no murmur, click, rub or gallop Abdomen: soft, non-tender; bowel sounds normal; no masses,  no organomegaly Extremities: extremities normal, atraumatic, no cyanosis or edema Pulses: 2+ and symmetric Skin: Skin color, texture, turgor normal. No rashes or lesions Neurologic: Grossly normal confused, follows commands    Labs on Admission:   Recent Labs  08/10/15 1927  NA 135  K 3.9  CL 93*  CO2 22  GLUCOSE 92  BUN 78*  CREATININE 24.07*  CALCIUM  9.4    Recent Labs  08/10/15 1927  WBC 18.5*  HGB 11.4*  HCT 35.3*  MCV 85.7  PLT 364   Radiological Exams on Admission: Ct Head Wo Contrast  08/10/2015   CLINICAL DATA:  Generalized weakness, worsening over the last 3 days. Peritoneal dialysis yesterday. Confusion. Headache.  EXAM: CT HEAD WITHOUT CONTRAST  TECHNIQUE: Contiguous axial images were obtained from the base of the skull through the vertex without intravenous contrast.  COMPARISON:  None.  FINDINGS: The brainstem, cerebellum, cerebral peduncles, thalamus, basal ganglia, basilar cisterns, and ventricular system  appear within normal limits. No intracranial hemorrhage, mass lesion, or acute CVA.  Opacification of multiple ethmoid air cells and partial opacification of others. Chronic right frontal sinusitis. Mild chronic left sphenoid sinusitis. Right mastoid effusion with fluid or some type of tissue medially in the right middle ear.  Arthropathy of the left temporomandibular joint.  IMPRESSION: 1. No acute intracranial findings. 2. Right mastoid effusion with fluid or soft tissue density medially in the right middle ear. 3. Chronic right frontal, left sphenoid, and bilateral ethmoid sinusitis. Some of the ethmoid air cells are completely opacified.   Electronically Signed   By: Gaylyn Rong M.D.   On: 08/10/2015 21:30   Dg Chest Port 1 View  08/10/2015   CLINICAL DATA:  49 year old male with fever and weakness. Dialysis patient. Initial encounter.  EXAM: PORTABLE CHEST - 1 VIEW  COMPARISON:  [facility] portable chest radiograph 10/26/2014 and earlier  FINDINGS: Portable AP upright view at 1928 hrs. Lower lung volumes and mild respiratory motion. Extent to a shin of cardiac size. Other mediastinal contours appear stable. Resolved basilar predominant interstitial opacity compared to 2015. Allowing for portable technique, the lungs are clear.  IMPRESSION: Low lung volumes, otherwise no acute cardiopulmonary abnormality.   Electronically Signed   By: Odessa Fleming M.D.   On: 08/10/2015 19:48   cxr reviewed no edema or infiltrate Old chart reviewed Case discussed with ed doctor miller Case discussed with nephrologist at [facility] dr deterding Case discussed with [facility] MD dr Toniann Fail ekg reviewed sinus tachycardia with no acute issues LVH  Assessment/Plan  49 yo male peritoneal dialysis comes in with metabolic encephalopathy due to fever/sepsis  Principal Problem:   Sepsis- in peritoneal dialysis patient.  Source unclear.  Panculture.  Dr deterding called and he will arrange for RN at [facility] to pull  peritoneal fluid for cell count, culture as we cannot do that here at [facility].  Broad spectrum abx started with vancomycin and zosyn.  Lactic acid level nml, transfer to [facility] to stepdown.  Check procalcitonin levels.  No ivf unless becomes hypotensive per dr deterding, pt normotensive at this time.  Pt placed on septic protocol, lactic acid level checks q 3 hours.    Active Problems:   Peritoneal dialysis status-  Consult to dr deterding done, nephro will see in am.  Order written for nursing staff to notify nephro of patient arrival to [facility] for any further orders per their team.   Hypertension- noted, bp stable at this time, hold home meds   Fever-  As above   Metabolic encephalopathy- secondary to fever, no meningeal signs.    Admit to stepdown unit at [facility].  Family updated both son and daughter present, they are getting info from their mom about who pt pcp and nephrologist is as we do not know this at this time.  FULL CODE.  Sherlock Nancarrow A 08/10/2015, 10:11 PM

## 2015-08-10 NOTE — ED Notes (Addendum)
PT c/o generalized weakness this week worsening x3 days. PT states last dialysis peritoneal at home last night. CBG 99 with EMS. PT alert and confused on arrival to ED with spells of inattention. PT c/o headache.

## 2015-08-10 NOTE — ED Provider Notes (Signed)
CSN: [id number]     Arrival date & time 08/10/15  1854 History   First MD Initiated Contact with Patient 08/10/15 1904     Chief Complaint  Patient presents with  . Weakness     (Consider location/radiation/quality/duration/timing/severity/associated sxs/prior Treatment) HPI Comments: The patient is a 49 year old male who presents complaining of generalized weakness, left flank pain and a high fever. He is on home peritoneal dialysis, he last did this last night. The family reports that he has had some confusion and generalized weakness. There is also been some periods of decreased mental status and inattention that started according to the paramedics. The patient complains of pain in the left posterior chest wrapping around to the left abdomen but denies nausea vomiting or abdominal pain.  Patient is a 49 y.o. male presenting with weakness. The history is provided by the patient.  Weakness    Past Medical History  Diagnosis Date  . Renal disorder   . Hypertension   . Peritoneal dialysis status   . Depression   . Neuropathy    History reviewed. No pertinent past surgical history. History reviewed. No pertinent family history. Social History  Substance Use Topics  . Smoking status: Never Smoker   . Smokeless tobacco: None  . Alcohol Use: No    Review of Systems  Neurological: Positive for weakness.  All other systems reviewed and are negative.     Allergies  Allegra; Diphenhydramine; and Eggs or egg-derived products  Home Medications   Prior to Admission medications   Medication Sig Start Date End Date Taking? Authorizing Provider  DULoxetine (CYMBALTA) 30 MG capsule Take 30 mg by mouth daily. 05/26/15   Historical Provider, MD  folic acid (FOLVITE) 1 MG tablet Take 1 mg by mouth daily. 05/26/15   Historical Provider, MD  FOSRENOL 1000 MG chewable tablet Chew 1,000 mg by mouth 4 (four) times daily. 07/20/15   Historical Provider, MD  gabapentin (NEURONTIN) 300 MG capsule  Take 300 mg by mouth 2 (two) times daily. 06/03/15   Historical Provider, MD  TOPROL XL 25 MG 24 hr tablet Take 25 mg by mouth daily. 05/26/15   Historical Provider, MD   BP 144/93 mmHg  Pulse 112  Temp(Src) 101.6 F (38.7 C) (Oral)  Resp 20  Ht  (1.727 m)  Wt 220 lb (99.791 kg)  BMI 33.46 kg/m2  SpO2 92% Physical Exam  Constitutional: He appears well-developed and well-nourished. He appears distressed.  HENT:  Head: Normocephalic and atraumatic.  Mouth/Throat: Oropharynx is clear and moist. No oropharyngeal exudate.  Eyes: Conjunctivae and EOM are normal. Pupils are equal, round, and reactive to light. Right eye exhibits no discharge. Left eye exhibits no discharge. No scleral icterus.  Neck: Normal range of motion. Neck supple. No JVD present. No thyromegaly present.  Cardiovascular: Regular rhythm, normal heart sounds and intact distal pulses.  Exam reveals no gallop and no friction rub.   No murmur heard. Tachycardic  Pulmonary/Chest: Effort normal. No respiratory distress. He has no wheezes. He has rales (scattered rales that clear with coughing, no tachypnea).  Abdominal: Soft. Bowel sounds are normal. He exhibits no distension and no mass. There is no tenderness.  No abdominal tenderness, no guarding, very soft, peritoneal dialysis catheter appears clean without redness drainage or warmth  Musculoskeletal: Normal range of motion. He exhibits no edema or tenderness.  No rashes, no swelling, no edema, no asymmetry of the upper or lower extremities  Lymphadenopathy:    He has no cervical  adenopathy.  Neurological: He is alert. Coordination normal.  The patient is mildly somnolent, he follows commands without difficulty including sitting up in the bed though he appears generally weak  Skin: Skin is warm and dry. No rash noted. No erythema.  Psychiatric: He has a normal mood and affect. His behavior is normal.  Nursing note and vitals reviewed.   ED Course  Procedures  (including critical care time) Labs Review Labs Reviewed  BASIC METABOLIC PANEL - Abnormal; Notable for the following:    Chloride 93 (*)    BUN 78 (*)    Creatinine, Ser 24.07 (*)    GFR calc non Af Amer 2 (*)    GFR calc Af Amer 2 (*)    Anion gap 20 (*)    All other components within normal limits  CBC - Abnormal; Notable for the following:    WBC 18.5 (*)    RBC 4.12 (*)    Hemoglobin 11.4 (*)    HCT 35.3 (*)    RDW 15.8 (*)    All other components within normal limits  CULTURE, BLOOD (ROUTINE X 2)  CULTURE, BLOOD (ROUTINE X 2)  URINE CULTURE  URINALYSIS, ROUTINE W REFLEX MICROSCOPIC (NOT AT [facility])  CBG MONITORING, ED  CBG MONITORING, ED  I-STAT CG4 LACTIC ACID, ED    Imaging Review Dg Chest Port 1 View  08/10/2015   CLINICAL DATA:  49 year old male with fever and weakness. Dialysis patient. Initial encounter.  EXAM: PORTABLE CHEST - 1 VIEW  COMPARISON:  [facility] portable chest radiograph 10/26/2014 and earlier  FINDINGS: Portable AP upright view at 1928 hrs. Lower lung volumes and mild respiratory motion. Extent to a shin of cardiac size. Other mediastinal contours appear stable. Resolved basilar predominant interstitial opacity compared to 2015. Allowing for portable technique, the lungs are clear.  IMPRESSION: Low lung volumes, otherwise no acute cardiopulmonary abnormality.   Electronically Signed   By: Odessa Fleming M.D.   On: 08/10/2015 19:48   I have personally reviewed and evaluated these images and lab results as part of my medical decision-making.  ED ECG REPORT  I personally interpreted this EKG   Date: 08/10/2015   Rate: 114  Rhythm: sinus tachycardia  QRS Axis: normal  Intervals: normal  ST/T Wave abnormalities: nonspecific ST/T changes  Conduction Disutrbances:none  Narrative Interpretation: LVH present -  T wave abnormlaities present - likely related to LVH  Old EKG Reviewed: none available   MDM   Final diagnoses:  Acute renal failure,  unspecified acute renal failure type  Sepsis, due to unspecified organism    Etiology of the patient's fever is unclear. He is tachycardic to 120, hypoxic to 92%, check chest x-ray, sepsis workup, labs, fluids, antibiotics, anticipate admit.  Labs are significantly abnormal with creatinine over 24, BUN close to 80, severe uremia along with the fever are likely the source of the patient's confusion and altered mental status. He has been given broad-spectrum antibiotics as well as some fluids to help with his severe sepsis. He will be admitted to a high level of care at [facility] where he can receive dialysis and ongoing treatment. I discussed his care with Dr. Onalee Hua of the hospitalist service who will see the patient in the emergency department and arrange transfer. CT scan of the brain viewed, no obvoius findings -   I have personally viewed and interpreted the imaging and agree with radiologist interpretation.  CRITICAL CARE Performed by: Vida Roller Total critical care  time: 35 Critical care time was exclusive of separately billable procedures and treating other patients. Critical care was necessary to treat or prevent imminent or life-threatening deterioration. Critical care was time spent personally by me on the following activities: development of treatment plan with patient and/or surrogate as well as nursing, discussions with consultants, evaluation of patient's response to treatment, examination of patient, obtaining history from patient or surrogate, ordering and performing treatments and interventions, ordering and review of laboratory studies, ordering and review of radiographic studies, pulse oximetry and re-evaluation of patient's condition.  Meds given in ED:  Medications  vancomycin (VANCOCIN) 1,500 mg in sodium chloride 0.9 % 500 mL IVPB (1,500 mg Intravenous New Bag/Given 08/10/15 2059)  meclizine (ANTIVERT) 12.5 MG tablet (not administered)  acetaminophen (TYLENOL)  tablet 1,000 mg (not administered)  piperacillin-tazobactam (ZOSYN) IVPB 3.375 g (0 g Intravenous Stopped 08/10/15 2030)  meclizine (ANTIVERT) tablet 25 mg (25 mg Oral Given 08/10/15 2033)  hydrOXYzine (ATARAX/VISTARIL) tablet 25 mg (25 mg Oral Given 08/10/15 2037)      Eber Hong, MD 08/10/15 2059

## 2015-08-10 NOTE — Progress Notes (Signed)
ANTIBIOTIC CONSULT NOTE - INITIAL  Pharmacy Consult for Vancomycin & Zosyn Indication: Sepsis  Allergies  Allergen Reactions  . Allegra [Fexofenadine] Other (See Comments)    hallucinations  . Diphenhydramine Other (See Comments)    mental status change  . Eggs Or Egg-Derived Products Nausea And Vomiting    Patient Measurements: Height:  (172.7 cm) Weight: 220 lb (99.791 kg) IBW/kg (Calculated) : 68.4 Adjusted Body Weight:  Vital Signs: Temp: 101.6 F (38.7 C) (09/04 2008) Temp Source: Oral (09/04 1852) BP: 141/78 mmHg (09/04 2103) Pulse Rate: 112 (09/04 2103) Intake/Output from previous day:   Intake/Output from this shift:    Labs:  Recent Labs  08/10/15 1927  WBC 18.5*  HGB 11.4*  PLT 364  CREATININE 24.07*   Estimated Creatinine Clearance: 4.3 mL/min (by C-G formula based on Cr of 24.07). No results for input(s): VANCOTROUGH, VANCOPEAK, VANCORANDOM, GENTTROUGH, GENTPEAK, GENTRANDOM, TOBRATROUGH, TOBRAPEAK, TOBRARND, AMIKACINPEAK, AMIKACINTROU, AMIKACIN in the last 72 hours.   Microbiology: Recent Results (from the past 720 hour(s))  Blood Culture (routine x 2)     Status: None (Preliminary result)   Collection Time: 08/10/15  7:25 PM  Result Value Ref Range Status   Specimen Description BLOOD RIGHT HAND  Final   Special Requests BOTTLES DRAWN AEROBIC AND ANAEROBIC 4CC EACH  Final   Culture PENDING  Incomplete   Report Status PENDING  Incomplete  Blood Culture (routine x 2)     Status: None (Preliminary result)   Collection Time: 08/10/15  7:28 PM  Result Value Ref Range Status   Specimen Description BLOOD LEFT HAND  Final   Special Requests BOTTLES DRAWN AEROBIC AND ANAEROBIC 4CC EACH  Final   Culture PENDING  Incomplete   Report Status PENDING  Incomplete    Medical History: Past Medical History  Diagnosis Date  . Renal disorder   . Hypertension   . Peritoneal dialysis status   . Depression   . Neuropathy     Assessment: 49 yo male ED  patient, on home peritoneal dialysis, last dialysis last evening at home Vancomycin & Zosyn for sepsis per pharmacy protocol Zosyn 3.375 GM IV given in ED MD note indicates patient to be transferred to [facility]   Goal of Therapy:  Vancomycin level per dialysis dosing levels  Plan: Vancomycin 1500 mg IV  X 1 dose tonight Next Vancomycin dose dependent on dialysis schedule Zosyn 2.25 GM IV every 8 hours, next dose 8 hours after ED dose. Labs per protocol     Josephine Igo 08/10/2015,9:09 PM

## 2015-08-11 DIAGNOSIS — D72829 Elevated white blood cell count, unspecified: Secondary | ICD-10-CM

## 2015-08-11 DIAGNOSIS — A419 Sepsis, unspecified organism: Principal | ICD-10-CM

## 2015-08-11 DIAGNOSIS — Z992 Dependence on renal dialysis: Secondary | ICD-10-CM

## 2015-08-11 LAB — CORTISOL: Cortisol, Plasma: 13.7 ug/dL

## 2015-08-11 LAB — CBC
HCT: 30.2 % — ABNORMAL LOW (ref 39.0–52.0)
HEMATOCRIT: 31.2 % — AB (ref 39.0–52.0)
Hemoglobin: 9.7 g/dL — ABNORMAL LOW (ref 13.0–17.0)
Hemoglobin: 9.4 g/dL — ABNORMAL LOW (ref 13.0–17.0)
MCH: 26.6 pg (ref 26.0–34.0)
MCH: 26.6 pg (ref 26.0–34.0)
MCHC: 31.1 g/dL (ref 30.0–36.0)
MCHC: 31.1 g/dL (ref 30.0–36.0)
MCV: 85.7 fL (ref 78.0–100.0)
MCV: 85.6 fL (ref 78.0–100.0)
PLATELETS: 318 10*3/uL (ref 150–400)
PLATELETS: 319 10*3/uL (ref 150–400)
RBC: 3.64 MIL/uL — AB (ref 4.22–5.81)
RBC: 3.53 MIL/uL — AB (ref 4.22–5.81)
RDW: 15.8 % — AB (ref 11.5–15.5)
RDW: 15.9 % — AB (ref 11.5–15.5)
WBC: 14.4 10*3/uL — AB (ref 4.0–10.5)
WBC: 14.8 10*3/uL — AB (ref 4.0–10.5)

## 2015-08-11 LAB — COMPREHENSIVE METABOLIC PANEL
ALBUMIN: 2.8 g/dL — AB (ref 3.5–5.0)
ALBUMIN: 2.7 g/dL — AB (ref 3.5–5.0)
ALT: 13 U/L — AB (ref 17–63)
ALT: 14 U/L — AB (ref 17–63)
AST: 11 U/L — AB (ref 15–41)
AST: 9 U/L — AB (ref 15–41)
Alkaline Phosphatase: 163 U/L — ABNORMAL HIGH (ref 38–126)
Alkaline Phosphatase: 169 U/L — ABNORMAL HIGH (ref 38–126)
Anion gap: 19 — ABNORMAL HIGH (ref 5–15)
Anion gap: 22 — ABNORMAL HIGH (ref 5–15)
BILIRUBIN TOTAL: 0.8 mg/dL (ref 0.3–1.2)
BUN: 77 mg/dL — AB (ref 6–20)
BUN: 79 mg/dL — AB (ref 6–20)
CHLORIDE: 94 mmol/L — AB (ref 101–111)
CHLORIDE: 92 mmol/L — AB (ref 101–111)
CO2: 21 mmol/L — ABNORMAL LOW (ref 22–32)
CO2: 20 mmol/L — AB (ref 22–32)
Calcium: 9.2 mg/dL (ref 8.9–10.3)
Calcium: 9.3 mg/dL (ref 8.9–10.3)
Creatinine, Ser: 24.84 mg/dL — ABNORMAL HIGH (ref 0.61–1.24)
Creatinine, Ser: 25.24 mg/dL — ABNORMAL HIGH (ref 0.61–1.24)
GFR calc Af Amer: 2 mL/min — ABNORMAL LOW (ref >60)
GFR calc Af Amer: 2 mL/min — ABNORMAL LOW (ref >60)
GFR calc non Af Amer: 2 mL/min — ABNORMAL LOW (ref >60)
GFR calc non Af Amer: 2 mL/min — ABNORMAL LOW (ref >60)
GLUCOSE: 96 mg/dL (ref 65–99)
GLUCOSE: 96 mg/dL (ref 65–99)
POTASSIUM: 4 mmol/L (ref 3.5–5.1)
POTASSIUM: 4 mmol/L (ref 3.5–5.1)
SODIUM: 134 mmol/L — AB (ref 135–145)
Sodium: 134 mmol/L — ABNORMAL LOW (ref 135–145)
Total Bilirubin: 0.9 mg/dL (ref 0.3–1.2)
Total Protein: 6.2 g/dL — ABNORMAL LOW (ref 6.5–8.1)
Total Protein: 6.4 g/dL — ABNORMAL LOW (ref 6.5–8.1)

## 2015-08-11 LAB — TYPE AND SCREEN
ABO/RH(D): O POS
Antibody Screen: NEGATIVE

## 2015-08-11 LAB — GRAM STAIN

## 2015-08-11 LAB — FIBRINOGEN: FIBRINOGEN: 709 mg/dL — AB (ref 204–475)

## 2015-08-11 LAB — PROCALCITONIN: PROCALCITONIN: 1.52 ng/mL

## 2015-08-11 LAB — MRSA PCR SCREENING: MRSA BY PCR: NEGATIVE

## 2015-08-11 LAB — BODY FLUID CELL COUNT WITH DIFFERENTIAL: WBC FLUID: 9 uL (ref 0–1000)

## 2015-08-11 LAB — LACTIC ACID, PLASMA
Lactic Acid, Venous: 0.7 mmol/L (ref 0.5–2.0)
Lactic Acid, Venous: 0.8 mmol/L (ref 0.5–2.0)

## 2015-08-11 LAB — GLUCOSE, CAPILLARY: Glucose-Capillary: 99 mg/dL (ref 65–99)

## 2015-08-11 LAB — PROTIME-INR
INR: 1.47 (ref 0.00–1.49)
PROTHROMBIN TIME: 17.9 s — AB (ref 11.6–15.2)

## 2015-08-11 LAB — APTT: APTT: 36 s (ref 24–37)

## 2015-08-11 LAB — TROPONIN I: TROPONIN I: 0.17 ng/mL — AB (ref <0.031)

## 2015-08-11 LAB — ABO/RH: ABO/RH(D): O POS

## 2015-08-11 MED ORDER — VANCOMYCIN HCL IN DEXTROSE 1-5 GM/200ML-% IV SOLN
1000.0000 mg | Freq: Once | INTRAVENOUS | Status: AC
  Administered 2015-08-11: 1000 mg via INTRAVENOUS
  Filled 2015-08-11: qty 200

## 2015-08-11 MED ORDER — SODIUM CHLORIDE 0.9 % IJ SOLN
3.0000 mL | INTRAMUSCULAR | Status: DC | PRN
  Administered 2015-08-13: 3 mL via INTRAVENOUS
  Filled 2015-08-11 (×2): qty 3

## 2015-08-11 MED ORDER — ONDANSETRON HCL 4 MG PO TABS: 4.0000 mg | ORAL_TABLET | Freq: Four times a day (QID) | ORAL | Status: DC | PRN

## 2015-08-11 MED ORDER — GENTAMICIN SULFATE 0.1 % EX CREA
1.0000 "application " | TOPICAL_CREAM | Freq: Every day | CUTANEOUS | Status: DC
  Administered 2015-08-11 – 2015-08-14 (×4): 1 via TOPICAL
  Filled 2015-08-11: qty 15

## 2015-08-11 MED ORDER — ONDANSETRON HCL 4 MG/2ML IJ SOLN: 4.0000 mg | Freq: Four times a day (QID) | INTRAMUSCULAR | Status: DC | PRN

## 2015-08-11 MED ORDER — HEPARIN SODIUM (PORCINE) 5000 UNIT/ML IJ SOLN
5000.0000 [IU] | Freq: Three times a day (TID) | INTRAMUSCULAR | Status: DC
  Administered 2015-08-11 – 2015-08-14 (×11): 5000 [IU] via SUBCUTANEOUS
  Filled 2015-08-11 (×11): qty 1

## 2015-08-11 MED ORDER — SODIUM CHLORIDE 0.9 % IV SOLN: 250.0000 mL | INTRAVENOUS | Status: DC | PRN

## 2015-08-11 MED ORDER — HEPARIN 1000 UNIT/ML FOR PERITONEAL DIALYSIS
500.0000 [IU] | INTRAMUSCULAR | Status: DC | PRN
  Filled 2015-08-11: qty 0.5

## 2015-08-11 MED ORDER — SODIUM CHLORIDE 0.9 % IJ SOLN
3.0000 mL | Freq: Two times a day (BID) | INTRAMUSCULAR | Status: DC
  Administered 2015-08-11 – 2015-08-15 (×9): 3 mL via INTRAVENOUS

## 2015-08-11 MED ORDER — HYDROXYZINE HCL 25 MG PO TABS
25.0000 mg | ORAL_TABLET | Freq: Three times a day (TID) | ORAL | Status: DC | PRN
  Filled 2015-08-11: qty 1

## 2015-08-11 MED ORDER — LANTHANUM CARBONATE 500 MG PO CHEW
1000.0000 mg | CHEWABLE_TABLET | Freq: Three times a day (TID) | ORAL | Status: DC
  Administered 2015-08-11 – 2015-08-15 (×12): 1000 mg via ORAL
  Filled 2015-08-11 (×14): qty 2

## 2015-08-11 MED ORDER — ONDANSETRON HCL 4 MG/2ML IJ SOLN: 4.0000 mg | Freq: Three times a day (TID) | INTRAMUSCULAR | Status: DC | PRN

## 2015-08-11 MED ORDER — DELFLEX-LC/1.5% DEXTROSE 346 MOSM/L IP SOLN: INTRAPERITONEAL | Status: DC

## 2015-08-11 NOTE — Progress Notes (Signed)
PROGRESS NOTE  Robert Hays ZOX:[id number] DOB: Aug 30, 1966 DOA: 08/10/2015 PCP: No primary care provider on file.  Assessment/Plan: Sepsis- peritoneal dialysis patient. Source unclear. Panculture.peritoneal fluid for cell count, culture-- NEVER DONE BEFORE ABX STARTED-- ordered this AM -Broad spectrum abx started with vancomycin and zosyn. Lactic acid level nml Check procalcitonin levels. No ivf unless becomes hypotensive per dr deterding, pt normotensive at this time.   Peritoneal dialysis status- Consult to dr deterding done, nephro will see in am. Order written for nursing staff to notify nephro of patient arrival to [facility] for any further orders per their team.   Hypertension- noted, bp stable at this time, hold home meds   Fever- As above   Metabolic encephalopathy- secondary to fever, no meningeal signs.  -holding home meds: bacolfen, cymbalta, Neurontin  leukocytosis -trend  Elevated troponin -suspect related to renal disease/spesis -no CP  Code Status: full Family Communication: patient Disposition Plan: tx to med surge on 6E later today   Consultants:  nephrology  Procedures:      HPI/Subjective: Awake but still confused  Objective: Filed Vitals:   08/11/15 0536  BP:   Pulse:   Temp: 97.8 F (36.6 C)  Resp:     Intake/Output Summary (Last 24 hours) at 08/11/15 0736 Last data filed at 08/11/15 0445  Gross per 24 hour  Intake    503 ml  Output      0 ml  Net    503 ml   Filed Weights   08/10/15 1925 08/11/15 0045 08/11/15 0536  Weight: 99.791 kg (220 lb) 96.8 kg (213 lb 6.5 oz) 96.3 kg (212 lb 4.9 oz)    Exam:   General:  Will awaken but confused  Cardiovascular: rrr  Respiratory: clear  Abdomen: +BS, non tender, no peritoneal signs  Musculoskeletal: min edema   Data Reviewed: Basic Metabolic Panel:  Recent Labs Lab 08/10/15 1927 08/11/15 0141 08/11/15 0323  NA 135 134* 134*  K 3.9 4.0 4.0  CL 93* 94*  92*  CO2 22 21* 20*  GLUCOSE 92 96 96  BUN 78* 77* 79*  CREATININE 24.07* 24.84* 25.24*  CALCIUM 9.4 9.2 9.3   Liver Function Tests:  Recent Labs Lab 08/10/15 2249 08/11/15 0141 08/11/15 0323  AST 9* 11* 9*  ALT 14* 13* 14*  ALKPHOS 182* 163* 169*  BILITOT 1.1 0.8 0.9  PROT 6.9 6.2* 6.4*  ALBUMIN 3.1* 2.8* 2.7*   No results for input(s): LIPASE, AMYLASE in the last 168 hours. No results for input(s): AMMONIA in the last 168 hours. CBC:  Recent Labs Lab 08/10/15 1927 08/11/15 0141 08/11/15 0323  WBC 18.5* 14.4* 14.8*  HGB 11.4* 9.7* 9.4*  HCT 35.3* 31.2* 30.2*  MCV 85.7 85.7 85.6  PLT 364 318 319   Cardiac Enzymes:  Recent Labs Lab 08/11/15 0141  TROPONINI 0.17*   BNP (last 3 results) No results for input(s): BNP in the last 8760 hours.  ProBNP (last 3 results) No results for input(s): PROBNP in the last 8760 hours.  CBG:  Recent Labs Lab 08/10/15 1857  GLUCAP 97    Recent Results (from the past 240 hour(s))  Blood Culture (routine x 2)     Status: None (Preliminary result)   Collection Time: 08/10/15  7:25 PM  Result Value Ref Range Status   Specimen Description BLOOD RIGHT HAND  Final   Special Requests BOTTLES DRAWN AEROBIC AND ANAEROBIC 4CC EACH  Final   Culture NO GROWTH < 12 HOURS  Final  Report Status PENDING  Incomplete  Blood Culture (routine x 2)     Status: None (Preliminary result)   Collection Time: 08/10/15  7:28 PM  Result Value Ref Range Status   Specimen Description BLOOD LEFT HAND  Final   Special Requests BOTTLES DRAWN AEROBIC AND ANAEROBIC 4CC EACH  Final   Culture NO GROWTH < 12 HOURS  Final   Report Status PENDING  Incomplete  MRSA PCR Screening     Status: None   Collection Time: 08/11/15  1:12 AM  Result Value Ref Range Status   MRSA by PCR NEGATIVE NEGATIVE Final    Comment:        The GeneXpert MRSA Assay (FDA approved for NASAL specimens only), is one component of a comprehensive MRSA colonization surveillance  program. It is not intended to diagnose MRSA infection nor to guide or monitor treatment for MRSA infections.      Studies: Ct Head Wo Contrast  08/10/2015   CLINICAL DATA:  Generalized weakness, worsening over the last 3 days. Peritoneal dialysis yesterday. Confusion. Headache.  EXAM: CT HEAD WITHOUT CONTRAST  TECHNIQUE: Contiguous axial images were obtained from the base of the skull through the vertex without intravenous contrast.  COMPARISON:  None.  FINDINGS: The brainstem, cerebellum, cerebral peduncles, thalamus, basal ganglia, basilar cisterns, and ventricular system appear within normal limits. No intracranial hemorrhage, mass lesion, or acute CVA.  Opacification of multiple ethmoid air cells and partial opacification of others. Chronic right frontal sinusitis. Mild chronic left sphenoid sinusitis. Right mastoid effusion with fluid or some type of tissue medially in the right middle ear.  Arthropathy of the left temporomandibular joint.  IMPRESSION: 1. No acute intracranial findings. 2. Right mastoid effusion with fluid or soft tissue density medially in the right middle ear. 3. Chronic right frontal, left sphenoid, and bilateral ethmoid sinusitis. Some of the ethmoid air cells are completely opacified.   Electronically Signed   By: Gaylyn Rong M.D.   On: 08/10/2015 21:30   Dg Chest Port 1 View  08/10/2015   CLINICAL DATA:  49 year old male with fever and weakness. Dialysis patient. Initial encounter.  EXAM: PORTABLE CHEST - 1 VIEW  COMPARISON:  [facility] portable chest radiograph 10/26/2014 and earlier  FINDINGS: Portable AP upright view at 1928 hrs. Lower lung volumes and mild respiratory motion. Extent to a shin of cardiac size. Other mediastinal contours appear stable. Resolved basilar predominant interstitial opacity compared to 2015. Allowing for portable technique, the lungs are clear.  IMPRESSION: Low lung volumes, otherwise no acute cardiopulmonary abnormality.    Electronically Signed   By: Odessa Fleming M.D.   On: 08/10/2015 19:48    Scheduled Meds: . heparin  5,000 Units Subcutaneous 3 times per day  . meclizine  25 mg Oral Once  . piperacillin-tazobactam (ZOSYN)  IV  2.25 g Intravenous Q8H  . sodium chloride  3 mL Intravenous Q12H   Continuous Infusions:  Antibiotics Given (last 72 hours)    Date/Time Action Medication Dose Rate   08/11/15 0445 Given   piperacillin-tazobactam (ZOSYN) IVPB 2.25 g 2.25 g 100 mL/hr      Principal Problem:   Sepsis Active Problems:   Peritoneal dialysis status   Hypertension   Fever   Metabolic encephalopathy    Time spent: 35 min    Nazaret Chea  [facility] Pager [phone] If 7PM-7AM, please contact night-coverage at [email or web address], password [facility] 08/11/2015, 7:36 AM  LOS: 1 day

## 2015-08-11 NOTE — Progress Notes (Signed)
ANTIBIOTIC CONSULT NOTE - FOLLOW UP  Pharmacy Consult for Vancomycin and Zosyn Indication: rule out sepsis  Allergies  Allergen Reactions  . Allegra [Fexofenadine] Other (See Comments)    hallucinations  . Diphenhydramine Other (See Comments)    mental status change  . Eggs Or Egg-Derived Products Nausea And Vomiting    Patient Measurements: Height:  (175.3 cm) Weight: 212 lb 4.9 oz (96.3 kg) IBW/kg (Calculated) : 70.7  Vital Signs: Temp: 98.5 F (36.9 C) (09/05 0738) Temp Source: Axillary (09/05 0738) BP: 126/90 mmHg (09/05 0738) Pulse Rate: 98 (09/05 0738) Intake/Output from previous day: 09/04 0701 - 09/05 0700 In: 503 [I.V.:503] Out: -  Intake/Output from this shift: Total I/O In: 3 [I.V.:3] Out: -   Labs:  Recent Labs  08/10/15 1927 08/11/15 0141 08/11/15 0323  WBC 18.5* 14.4* 14.8*  HGB 11.4* 9.7* 9.4*  PLT 364 318 319  CREATININE 24.07* 24.84* 25.24*   Estimated Creatinine Clearance: 4.1 mL/min (by C-G formula based on Cr of 25.24). No results for input(s): VANCOTROUGH, VANCOPEAK, VANCORANDOM, GENTTROUGH, GENTPEAK, GENTRANDOM, TOBRATROUGH, TOBRAPEAK, TOBRARND, AMIKACINPEAK, AMIKACINTROU, AMIKACIN in the last 72 hours.   Microbiology: Recent Results (from the past 720 hour(s))  Blood Culture (routine x 2)     Status: None (Preliminary result)   Collection Time: 08/10/15  7:25 PM  Result Value Ref Range Status   Specimen Description BLOOD RIGHT HAND  Final   Special Requests BOTTLES DRAWN AEROBIC AND ANAEROBIC 4CC EACH  Final   Culture NO GROWTH < 12 HOURS  Final   Report Status PENDING  Incomplete  Blood Culture (routine x 2)     Status: None (Preliminary result)   Collection Time: 08/10/15  7:28 PM  Result Value Ref Range Status   Specimen Description BLOOD LEFT HAND  Final   Special Requests BOTTLES DRAWN AEROBIC AND ANAEROBIC 4CC EACH  Final   Culture NO GROWTH < 12 HOURS  Final   Report Status PENDING  Incomplete  MRSA PCR Screening      Status: None   Collection Time: 08/11/15  1:12 AM  Result Value Ref Range Status   MRSA by PCR NEGATIVE NEGATIVE Final    Comment:        The GeneXpert MRSA Assay (FDA approved for NASAL specimens only), is one component of a comprehensive MRSA colonization surveillance program. It is not intended to diagnose MRSA infection nor to guide or monitor treatment for MRSA infections.     Anti-infectives    Start     Dose/Rate Route Frequency Ordered Stop   08/11/15 0400  piperacillin-tazobactam (ZOSYN) IVPB 2.25 g     2.25 g 100 mL/hr over 30 Minutes Intravenous Every 8 hours 08/10/15 2109     08/10/15 1930  vancomycin (VANCOCIN) 2,000 mg in sodium chloride 0.9 % 500 mL IVPB  Status:  Discontinued     2,000 mg 250 mL/hr over 120 Minutes Intravenous  Once 08/10/15 1926 08/10/15 1927   08/10/15 1930  vancomycin (VANCOCIN) 1,500 mg in sodium chloride 0.9 % 500 mL IVPB     1,500 mg 250 mL/hr over 120 Minutes Intravenous  Once 08/10/15 1927 08/10/15 2259   08/10/15 1915  piperacillin-tazobactam (ZOSYN) IVPB 3.375 g     3.375 g 100 mL/hr over 30 Minutes Intravenous  Once 08/10/15 1913 08/10/15 2030   08/10/15 1915  vancomycin (VANCOCIN) IVPB 1000 mg/200 mL premix  Status:  Discontinued     1,000 mg 200 mL/hr over 60 Minutes Intravenous  Once 08/10/15  1913 08/10/15 1926      Assessment: 49 year old male admitted with sepsis.  He is on peritoneal dialysis with suspected noncompliance at home.  He received a Vancomycin dose 9/4 PM and needs additional Vancomycin to complete a loading dose.  His Zosyn regimen is appropriate for PD and sepsis.   Plan:  Continue Zosyn 2.25gm IV q8h Give Vancomycin 1g x 1 today to complete his loading dose Follow-up renal plans for dialysis If continues PD will check random Vancomycin level in 3-4 days and redose Vancomycin when level is <20. Follow available micro data  Estella Husk, Pharm.D., BCPS, AAHIVP Clinical Pharmacist Phone: [phone] or  [phone] 08/11/2015, 9:06 AM

## 2015-08-11 NOTE — Progress Notes (Signed)
Utilization Review Completed.Robert Hays T9/04/2015  

## 2015-08-11 NOTE — Consult Note (Signed)
Reason for Consult: To manage dialysis and dialysis related needs Referring Physician: [name] is an 49 y.o. male with only past medical history documented as hypertension, ESRD on peritoneal dialysis and also possibly some chronic pain given the medications that he is on. History is unobtainable secondary to decreased mental status. Patient was brought into the emergency department last evening secondary to decreased mental status.  Family brings him and indicates that they're not sure he is doing his dialysis as he should. All that he can tell me is that he's been on peritoneal dialysis for 3 years. He appears to use the cycler. BUN and creatinine on arrival are  78 and 24 and he was also noted to have an elevated white blood cell count. He's been admitted and treated presumptively for sepsis started on Zosyn and vancomycin. Blood cultures have been obtained. PD fluid was also sent for cell count and culture and that appears to be negative as well. Patient is currently complaining of being itchy and feet hurting. His Cymbalta and Neurontin have been put on hold secondary to his decreased mental status.    Dialyzes we think followed by Dr. Hinda Lenis  on PD but patient is unable to tell me his regimen it appears he uses cycler   Past Medical History  Diagnosis Date  . Renal disorder   . Hypertension   . Peritoneal dialysis status   . Depression   . Neuropathy     History reviewed. No pertinent past surgical history.  History reviewed. No pertinent family history.  Social History:  reports that he has never smoked. He does not have any smokeless tobacco history on file. He reports that he does not drink alcohol or use illicit drugs.  Allergies:  Allergies  Allergen Reactions  . Allegra [Fexofenadine] Other (See Comments)    hallucinations  . Diphenhydramine Other (See Comments)    mental status change  . Eggs Or Egg-Derived Products Nausea And Vomiting    Medications: I  have reviewed the patient's current medications.and there is no vitamin D, Fosrenol is on this med list    Results for orders placed or performed during the hospital encounter of 08/10/15 (from the past 48 hour(s))  CBG monitoring, ED     Status: None   Collection Time: 08/10/15  6:57 PM  Result Value Ref Range   Glucose-Capillary 97 65 - 99 mg/dL  Blood Culture (routine x 2)     Status: None (Preliminary result)   Collection Time: 08/10/15  7:25 PM  Result Value Ref Range   Specimen Description BLOOD RIGHT HAND    Special Requests BOTTLES DRAWN AEROBIC AND ANAEROBIC 4CC EACH    Culture NO GROWTH < 12 HOURS    Report Status PENDING   Basic metabolic panel     Status: Abnormal   Collection Time: 08/10/15  7:27 PM  Result Value Ref Range   Sodium 135 135 - 145 mmol/L   Potassium 3.9 3.5 - 5.1 mmol/L   Chloride 93 (L) 101 - 111 mmol/L   CO2 22 22 - 32 mmol/L   Glucose, Bld 92 65 - 99 mg/dL   BUN 78 (H) 6 - 20 mg/dL   Creatinine, Ser 24.07 (H) 0.61 - 1.24 mg/dL   Calcium 9.4 8.9 - 10.3 mg/dL   GFR calc non Af Amer 2 (L) >60 mL/min   GFR calc Af Amer 2 (L) >60 mL/min    Comment: (NOTE) The eGFR has been calculated using the CKD  EPI equation. This calculation has not been validated in all clinical situations. eGFR's persistently <60 mL/min signify possible Chronic Kidney Disease.    Anion gap 20 (H) 5 - 15  CBC     Status: Abnormal   Collection Time: 08/10/15  7:27 PM  Result Value Ref Range   WBC 18.5 (H) 4.0 - 10.5 K/uL   RBC 4.12 (L) 4.22 - 5.81 MIL/uL   Hemoglobin 11.4 (L) 13.0 - 17.0 g/dL   HCT 35.3 (L) 39.0 - 52.0 %   MCV 85.7 78.0 - 100.0 fL   MCH 27.7 26.0 - 34.0 pg   MCHC 32.3 30.0 - 36.0 g/dL   RDW 15.8 (H) 11.5 - 15.5 %   Platelets 364 150 - 400 K/uL  Blood Culture (routine x 2)     Status: None (Preliminary result)   Collection Time: 08/10/15  7:28 PM  Result Value Ref Range   Specimen Description BLOOD LEFT HAND    Special Requests BOTTLES DRAWN AEROBIC AND  ANAEROBIC 4CC EACH    Culture NO GROWTH < 12 HOURS    Report Status PENDING   I-Stat CG4 Lactic Acid, ED  (not at  [facility])     Status: None   Collection Time: 08/10/15  7:31 PM  Result Value Ref Range   Lactic Acid, Venous 0.86 0.5 - 2.0 mmol/L  Hepatic function panel     Status: Abnormal   Collection Time: 08/10/15 10:49 PM  Result Value Ref Range   Total Protein 6.9 6.5 - 8.1 g/dL   Albumin 3.1 (L) 3.5 - 5.0 g/dL   AST 9 (L) 15 - 41 U/L   ALT 14 (L) 17 - 63 U/L   Alkaline Phosphatase 182 (H) 38 - 126 U/L   Total Bilirubin 1.1 0.3 - 1.2 mg/dL   Bilirubin, Direct 0.1 0.1 - 0.5 mg/dL   Indirect Bilirubin 1.0 (H) 0.3 - 0.9 mg/dL  I-Stat CG4 Lactic Acid, ED  (not at  [facility])     Status: None   Collection Time: 08/10/15 10:51 PM  Result Value Ref Range   Lactic Acid, Venous 0.52 0.5 - 2.0 mmol/L  MRSA PCR Screening     Status: None   Collection Time: 08/11/15  1:12 AM  Result Value Ref Range   MRSA by PCR NEGATIVE NEGATIVE    Comment:        The GeneXpert MRSA Assay (FDA approved for NASAL specimens only), is one component of [facility] comprehensive MRSA colonization surveillance program. It is not intended to diagnose MRSA infection nor to guide or monitor treatment for MRSA infections.   CBC     Status: Abnormal   Collection Time: 08/11/15  1:41 AM  Result Value Ref Range   WBC 14.4 (H) 4.0 - 10.5 K/uL   RBC 3.64 (L) 4.22 - 5.81 MIL/uL   Hemoglobin 9.7 (L) 13.0 - 17.0 g/dL   HCT 31.2 (L) 39.0 - 52.0 %   MCV 85.7 78.0 - 100.0 fL   MCH 26.6 26.0 - 34.0 pg   MCHC 31.1 30.0 - 36.0 g/dL   RDW 15.8 (H) 11.5 - 15.5 %   Platelets 318 150 - 400 K/uL  Comprehensive metabolic panel     Status: Abnormal   Collection Time: 08/11/15  1:41 AM  Result Value Ref Range   Sodium 134 (L) 135 - 145 mmol/L   Potassium 4.0 3.5 - 5.1 mmol/L   Chloride 94 (L) 101 - 111 mmol/L   CO2 21 (L) 22 -  32 mmol/L   Glucose, Bld 96 65 - 99 mg/dL   BUN 77 (H) 6 - 20 mg/dL   Creatinine, Ser 24.84 (H) 0.61 -  1.24 mg/dL    Comment: RESULTS CONFIRMED BY MANUAL DILUTION   Calcium 9.2 8.9 - 10.3 mg/dL   Total Protein 6.2 (L) 6.5 - 8.1 g/dL   Albumin 2.8 (L) 3.5 - 5.0 g/dL   AST 11 (L) 15 - 41 U/L   ALT 13 (L) 17 - 63 U/L   Alkaline Phosphatase 163 (H) 38 - 126 U/L   Total Bilirubin 0.8 0.3 - 1.2 mg/dL   GFR calc non Af Amer 2 (L) >60 mL/min   GFR calc Af Amer 2 (L) >60 mL/min    Comment: (NOTE) The eGFR has been calculated using the CKD EPI equation. This calculation has not been validated in all clinical situations. eGFR's persistently <60 mL/min signify possible Chronic Kidney Disease.    Anion gap 19 (H) 5 - 15  Lactic acid, plasma     Status: None   Collection Time: 08/11/15  1:41 AM  Result Value Ref Range   Lactic Acid, Venous 0.7 0.5 - 2.0 mmol/L  Cortisol     Status: None   Collection Time: 08/11/15  1:41 AM  Result Value Ref Range   Cortisol, Plasma 13.7 ug/dL    Comment: (NOTE) AM    6.7 - 22.6 ug/dL PM   <10.0       ug/dL   Troponin I     Status: Abnormal   Collection Time: 08/11/15  1:41 AM  Result Value Ref Range   Troponin I 0.17 (H) <0.031 ng/mL    Comment:        PERSISTENTLY INCREASED TROPONIN VALUES IN THE RANGE OF 0.04-0.49 ng/mL CAN BE SEEN IN:       -UNSTABLE ANGINA       -CONGESTIVE HEART FAILURE       -MYOCARDITIS       -CHEST TRAUMA       -ARRYHTHMIAS       -LATE PRESENTING MYOCARDIAL INFARCTION       -COPD   CLINICAL FOLLOW-UP RECOMMENDED.   Protime-INR     Status: Abnormal   Collection Time: 08/11/15  1:41 AM  Result Value Ref Range   Prothrombin Time 17.9 (H) 11.6 - 15.2 seconds   INR 1.47 0.00 - 1.49  APTT     Status: None   Collection Time: 08/11/15  1:41 AM  Result Value Ref Range   aPTT 36 24 - 37 seconds  Fibrinogen     Status: Abnormal   Collection Time: 08/11/15  1:41 AM  Result Value Ref Range   Fibrinogen 709 (H) 204 - 475 mg/dL  Type and screen     Status: None   Collection Time: 08/11/15  1:41 AM  Result Value Ref Range    ABO/RH(D) O POS    Antibody Screen NEG    Sample Expiration 08/14/2015   Procalcitonin - Baseline     Status: None   Collection Time: 08/11/15  1:41 AM  Result Value Ref Range   Procalcitonin 1.52 ng/mL    Comment:        Interpretation: PCT > 0.5 ng/mL and <= 2 ng/mL: Systemic infection (sepsis) is possible, but other conditions are known to elevate PCT as well. (NOTE)         ICU PCT Algorithm               Non  ICU PCT Algorithm    ----------------------------     ------------------------------         PCT < 0.25 ng/mL                 PCT < 0.1 ng/mL     Stopping of antibiotics            Stopping of antibiotics       strongly encouraged.               strongly encouraged.    ----------------------------     ------------------------------       PCT level decrease by               PCT < 0.25 ng/mL       >= 80% from peak PCT       OR PCT 0.25 - 0.5 ng/mL          Stopping of antibiotics                                             encouraged.     Stopping of antibiotics           encouraged.    ----------------------------     ------------------------------       PCT level decrease by              PCT >= 0.25 ng/mL       < 80% from peak PCT        AND PCT >= 0.5 ng/mL             Continuing antibiotics                                              encouraged.       Continuing antibiotics            encouraged.    ----------------------------     ------------------------------     PCT level increase compared          PCT > 0.5 ng/mL         with peak PCT AND          PCT >= 0.5 ng/mL             Escalation of antibiotics                                          strongly encouraged.      Escalation of antibiotics        strongly encouraged.   ABO/Rh     Status: None   Collection Time: 08/11/15  1:41 AM  Result Value Ref Range   ABO/RH(D) O POS   Lactic acid, plasma     Status: None   Collection Time: 08/11/15  3:22 AM  Result Value Ref Range   Lactic Acid, Venous 0.8 0.5  - 2.0 mmol/L  CBC     Status: Abnormal   Collection Time: 08/11/15  3:23 AM  Result Value Ref Range   WBC 14.8 (H) 4.0 - 10.5 K/uL   RBC 3.53 (L) 4.22 - 5.81 MIL/uL   Hemoglobin 9.4 (L) 13.0 -  17.0 g/dL   HCT 30.2 (L) 39.0 - 52.0 %   MCV 85.6 78.0 - 100.0 fL   MCH 26.6 26.0 - 34.0 pg   MCHC 31.1 30.0 - 36.0 g/dL   RDW 15.9 (H) 11.5 - 15.5 %   Platelets 319 150 - 400 K/uL  Comprehensive metabolic panel     Status: Abnormal   Collection Time: 08/11/15  3:23 AM  Result Value Ref Range   Sodium 134 (L) 135 - 145 mmol/L   Potassium 4.0 3.5 - 5.1 mmol/L   Chloride 92 (L) 101 - 111 mmol/L   CO2 20 (L) 22 - 32 mmol/L   Glucose, Bld 96 65 - 99 mg/dL   BUN 79 (H) 6 - 20 mg/dL   Creatinine, Ser 25.24 (H) 0.61 - 1.24 mg/dL    Comment: RESULTS CONFIRMED BY MANUAL DILUTION   Calcium 9.3 8.9 - 10.3 mg/dL   Total Protein 6.4 (L) 6.5 - 8.1 g/dL   Albumin 2.7 (L) 3.5 - 5.0 g/dL   AST 9 (L) 15 - 41 U/L   ALT 14 (L) 17 - 63 U/L   Alkaline Phosphatase 169 (H) 38 - 126 U/L   Total Bilirubin 0.9 0.3 - 1.2 mg/dL   GFR calc non Af Amer 2 (L) >60 mL/min   GFR calc Af Amer 2 (L) >60 mL/min    Comment: (NOTE) The eGFR has been calculated using the CKD EPI equation. This calculation has not been validated in all clinical situations. eGFR's persistently <60 mL/min signify possible Chronic Kidney Disease.    Anion gap 22 (H) 5 - 15  Body fluid cell count with differential     Status: Abnormal   Collection Time: 08/11/15 10:16 AM  Result Value Ref Range   Fluid Type-FCT PERITONEAL DIALYSIS    Color, Fluid COLORLESS ([facility]) YELLOW   Appearance, Fluid CLEAR CLEAR   WBC, Fluid 9 0 - 1000 cu mm   Other Cells, Fluid TOO FEW TO COUNT, SMEAR AVAILABLE FOR REVIEW %    Comment: RARE NEUTROPHILS AND MONOCYTES  Gram stain     Status: None   Collection Time: 08/11/15 10:16 AM  Result Value Ref Range   Specimen Description FLUID PERITONEAL    Special Requests NONE    Gram Stain      DEGENERATED CELLULAR  MATERIAL PRESENT NO ORGANISMS SEEN    Report Status 08/11/2015 FINAL     Ct Head Wo Contrast  08/10/2015   CLINICAL DATA:  Generalized weakness, worsening over the last 3 days. Peritoneal dialysis yesterday. Confusion. Headache.  EXAM: CT HEAD WITHOUT CONTRAST  TECHNIQUE: Contiguous axial images were obtained from the base of the skull through the vertex without intravenous contrast.  COMPARISON:  None.  FINDINGS: The brainstem, cerebellum, cerebral peduncles, thalamus, basal ganglia, basilar cisterns, and ventricular system appear within normal limits. No intracranial hemorrhage, mass lesion, or acute CVA.  Opacification of multiple ethmoid air cells and partial opacification of others. Chronic right frontal sinusitis. Mild chronic left sphenoid sinusitis. Right mastoid effusion with fluid or some type of tissue medially in the right middle ear.  Arthropathy of the left temporomandibular joint.  IMPRESSION: 1. No acute intracranial findings. 2. Right mastoid effusion with fluid or soft tissue density medially in the right middle ear. 3. Chronic right frontal, left sphenoid, and bilateral ethmoid sinusitis. Some of the ethmoid air cells are completely opacified.   Electronically Signed   By: Van Clines M.D.   On: 08/10/2015 21:30   Dg  Chest Port 1 View  08/10/2015   CLINICAL DATA:  49 year old male with fever and weakness. Dialysis patient. Initial encounter.  EXAM: PORTABLE CHEST - 1 VIEW  COMPARISON:  [facility] portable chest radiograph 10/26/2014 and earlier  FINDINGS: Portable AP upright view at 1928 hrs. Lower lung volumes and mild respiratory motion. Extent to [facility] shin of cardiac size. Other mediastinal contours appear stable. Resolved basilar predominant interstitial opacity compared to 2015. Allowing for portable technique, the lungs are clear.  IMPRESSION: Low lung volumes, otherwise no acute cardiopulmonary abnormality.   Electronically Signed   By: Genevie Ann M.D.   On:  08/10/2015 19:48    ROS: Difficult to obtain as patient is falling asleep and repeats himself. He does say that he is itching and he also has bilateral foot pain  Blood pressure 126/90, pulse 98, temperature 98.5 F (36.9 C), temperature source Axillary, resp. rate 16, height _0  (1.753 m), weight 96.3 kg (212 lb 4.9 oz), SpO2 100 %. General appearance: distracted and fatigued Eyes: conjunctivae/corneas clear. PERRL, EOM's intact. Fundi benign. Resp: clear to auscultation bilaterally Cardio: regular rate and rhythm, S1, S2 normal, no murmur, click, rub or gallop GI: soft, non-tender; bowel sounds normal; no masses,  no organomegaly and PD catheter in place Extremities: edema Trace to 1+ Skin: Skin color, texture, turgor normal. No rashes or lesions poor dentition  Assessment/Plan: 49 year old black male with reported ESRD on peritoneal dialysis for 3 years. He is hospitalized for decreased mental status  1 decreased mental status - possibly secondary to uremia. There could also be medication effect with Neurontin/Lyrica. Those medications are currently on hold. We will establish some dialysis which should help his mental status if it is due to uremia.  2 ESRD: On peritoneal dialysis. He does not appear to have peritonitis. We will establish some basic dialysis with [facility] cycler over the next 24 hours  3 Hypertension: Actually does not appear to be [facility] significant issue at this time. He is only on Toprol. I will use 1.5% PD fluid  4. Anemia of ESRD: Just slightly under goal at this time. We'll check iron stores and give the ESA  5. Metabolic Bone Disease: We'll continue his outpatient Fosrenol dosing 6. ID- elevated WBC- unknown source- cultures pending- on zosyn/vanc per primary team    Robert [facility] 08/11/2015, 12:25 PM

## 2015-08-12 DIAGNOSIS — I10 Essential (primary) hypertension: Secondary | ICD-10-CM

## 2015-08-12 DIAGNOSIS — G9341 Metabolic encephalopathy: Secondary | ICD-10-CM

## 2015-08-12 LAB — CBC
HCT: 29.8 % — ABNORMAL LOW (ref 39.0–52.0)
HEMOGLOBIN: 9.5 g/dL — AB (ref 13.0–17.0)
MCH: 27.1 pg (ref 26.0–34.0)
MCHC: 31.9 g/dL (ref 30.0–36.0)
MCV: 84.9 fL (ref 78.0–100.0)
PLATELETS: 301 10*3/uL (ref 150–400)
RBC: 3.51 MIL/uL — ABNORMAL LOW (ref 4.22–5.81)
RDW: 15.5 % (ref 11.5–15.5)
WBC: 12.4 10*3/uL — ABNORMAL HIGH (ref 4.0–10.5)

## 2015-08-12 LAB — RENAL FUNCTION PANEL
ALBUMIN: 2.8 g/dL — AB (ref 3.5–5.0)
ANION GAP: 19 — AB (ref 5–15)
BUN: 89 mg/dL — AB (ref 6–20)
CALCIUM: 9.2 mg/dL (ref 8.9–10.3)
CO2: 22 mmol/L (ref 22–32)
CREATININE: 25.51 mg/dL — AB (ref 0.61–1.24)
Chloride: 91 mmol/L — ABNORMAL LOW (ref 101–111)
GFR calc Af Amer: 2 mL/min — ABNORMAL LOW (ref >60)
GFR calc non Af Amer: 2 mL/min — ABNORMAL LOW (ref >60)
GLUCOSE: 106 mg/dL — AB (ref 65–99)
PHOSPHORUS: 11.3 mg/dL — AB (ref 2.5–4.6)
Potassium: 4 mmol/L (ref 3.5–5.1)
SODIUM: 132 mmol/L — AB (ref 135–145)

## 2015-08-12 LAB — BODY FLUID CELL COUNT WITH DIFFERENTIAL: Total Nucleated Cell Count, Fluid: 4 cu mm (ref 0–1000)

## 2015-08-12 LAB — IRON AND TIBC
IRON: 17 ug/dL — AB (ref 45–182)
SATURATION RATIOS: 8 % — AB (ref 17.9–39.5)
TIBC: 207 ug/dL — ABNORMAL LOW (ref 250–450)
UIBC: 190 ug/dL

## 2015-08-12 LAB — FERRITIN: FERRITIN: 191 ng/mL (ref 24–336)

## 2015-08-12 LAB — PROCALCITONIN: Procalcitonin: 2.17 ng/mL

## 2015-08-12 MED ORDER — DARBEPOETIN ALFA 60 MCG/0.3ML IJ SOSY
60.0000 ug | PREFILLED_SYRINGE | INTRAMUSCULAR | Status: DC
  Administered 2015-08-12: 60 ug via SUBCUTANEOUS
  Filled 2015-08-12 (×2): qty 0.3

## 2015-08-12 MED ORDER — DELFLEX-LM/2.5% DEXTROSE 396 MOSM/L IP SOLN: Freq: Once | INTRAPERITONEAL | Status: DC

## 2015-08-12 MED ORDER — SODIUM CHLORIDE 0.9 % IV SOLN
125.0000 mg | Freq: Every day | INTRAVENOUS | Status: AC
  Administered 2015-08-12 – 2015-08-14 (×3): 125 mg via INTRAVENOUS
  Filled 2015-08-12 (×4): qty 10

## 2015-08-12 MED ORDER — DELFLEX-LC/1.5% DEXTROSE 346 MOSM/L IP SOLN: INTRAPERITONEAL | Status: DC

## 2015-08-12 MED ORDER — DELFLEX-LC/2.5% DEXTROSE 394 MOSM/L IP SOLN: Freq: Every day | INTRAPERITONEAL | Status: DC

## 2015-08-12 MED ORDER — DARBEPOETIN ALFA 60 MCG/0.3ML IJ SOSY
60.0000 ug | PREFILLED_SYRINGE | INTRAMUSCULAR | Status: DC
  Filled 2015-08-12: qty 0.3

## 2015-08-12 MED ORDER — METOPROLOL SUCCINATE ER 25 MG PO TB24
25.0000 mg | ORAL_TABLET | Freq: Every day | ORAL | Status: DC
  Administered 2015-08-12 – 2015-08-13 (×2): 25 mg via ORAL
  Filled 2015-08-12 (×2): qty 1

## 2015-08-12 NOTE — Progress Notes (Signed)
Subjective:  Low grade temp- CCPD started but no significant change in labs yet- sleepy this AM on bipap- "cone" but did not know month Objective Vital signs in last 24 hours: Filed Vitals:   08/12/15 0410 08/12/15 0500 08/12/15 0600 08/12/15 0737  BP: 162/89  152/92   Pulse: 85  94   Temp:    98 F (36.7 C)  TempSrc:    Oral  Resp: 12  16   Height:      Weight:  98.7 kg (217 lb 9.5 oz)    SpO2: 97%  98%    Weight change: -1.091 kg (-2 lb 6.5 oz)  Intake/Output Summary (Last 24 hours) at 08/12/15 1610 Last data filed at 08/12/15 0558  Gross per 24 hour  Intake    353 ml  Output      0 ml  Net    353 ml    Assessment/Plan: 49 year old black male with reported ESRD on peritoneal dialysis for 3 years. He is hospitalized for decreased mental status  1 decreased mental status - possibly secondary to uremia. There could also be medication effect with Neurontin/Lyrica/baclofen. Those medications are currently on hold. We will continue with dialysis (increase the number of exchanges ) which should help his mental status if it is due to uremia.  2 ESRD: On peritoneal dialysis. He does not appear to have peritonitis. We will continue and be more aggressive with the  dialysis with a cycler over the next 24 hours  3 Hypertension: Is higher at this time. resume Toprol. I will use combo of 2.5% and  1.5% PD fluid  4. Anemia of ESRD: Just slightly under goal at this time. Iron low - will replete and give the ESA  5. Metabolic Bone Disease: We'll continue his outpatient Fosrenol dosing 6. ID- elevated WBC- unknown source- cultures pending- on zosyn/vanc per primary team    Kathryn Cosby A    Labs: Basic Metabolic Panel:  Recent Labs Lab 08/11/15 0141 08/11/15 0323 08/12/15 0559  NA 134* 134* 132*  K 4.0 4.0 4.0  CL 94* 92* 91*  CO2 21* 20* 22  GLUCOSE 96 96 106*  BUN 77* 79* 89*  CREATININE 24.84* 25.24* 25.51*  CALCIUM 9.2 9.3 9.2  PHOS  --   --  11.3*   Liver  Function Tests:  Recent Labs Lab 08/10/15 2249 08/11/15 0141 08/11/15 0323 08/12/15 0559  AST 9* 11* 9*  --   ALT 14* 13* 14*  --   ALKPHOS 182* 163* 169*  --   BILITOT 1.1 0.8 0.9  --   PROT 6.9 6.2* 6.4*  --   ALBUMIN 3.1* 2.8* 2.7* 2.8*   No results for input(s): LIPASE, AMYLASE in the last 168 hours. No results for input(s): AMMONIA in the last 168 hours. CBC:  Recent Labs Lab 08/10/15 1927 08/11/15 0141 08/11/15 0323 08/12/15 0559  WBC 18.5* 14.4* 14.8* 12.4*  HGB 11.4* 9.7* 9.4* 9.5*  HCT 35.3* 31.2* 30.2* 29.8*  MCV 85.7 85.7 85.6 84.9  PLT 364 318 319 301   Cardiac Enzymes:  Recent Labs Lab 08/11/15 0141  TROPONINI 0.17*   CBG:  Recent Labs Lab 08/10/15 1857 08/11/15 2021  GLUCAP 97 99    Iron Studies:  Recent Labs  08/12/15 0559  IRON 17*  TIBC 207*  FERRITIN 191   Studies/Results: Ct Head Wo Contrast  08/10/2015   CLINICAL DATA:  Generalized weakness, worsening over the last 3 days. Peritoneal dialysis yesterday. Confusion. Headache.  EXAM: CT  HEAD WITHOUT CONTRAST  TECHNIQUE: Contiguous axial images were obtained from the base of the skull through the vertex without intravenous contrast.  COMPARISON:  None.  FINDINGS: The brainstem, cerebellum, cerebral peduncles, thalamus, basal ganglia, basilar cisterns, and ventricular system appear within normal limits. No intracranial hemorrhage, mass lesion, or acute CVA.  Opacification of multiple ethmoid air cells and partial opacification of others. Chronic right frontal sinusitis. Mild chronic left sphenoid sinusitis. Right mastoid effusion with fluid or some type of tissue medially in the right middle ear.  Arthropathy of the left temporomandibular joint.  IMPRESSION: 1. No acute intracranial findings. 2. Right mastoid effusion with fluid or soft tissue density medially in the right middle ear. 3. Chronic right frontal, left sphenoid, and bilateral ethmoid sinusitis. Some of the ethmoid air cells are  completely opacified.   Electronically Signed   By: Gaylyn Rong M.D.   On: 08/10/2015 21:30   Dg Chest Port 1 View  08/10/2015   CLINICAL DATA:  49 year old male with fever and weakness. Dialysis patient. Initial encounter.  EXAM: PORTABLE CHEST - 1 VIEW  COMPARISON:  [facility] portable chest radiograph 10/26/2014 and earlier  FINDINGS: Portable AP upright view at 1928 hrs. Lower lung volumes and mild respiratory motion. Extent to a shin of cardiac size. Other mediastinal contours appear stable. Resolved basilar predominant interstitial opacity compared to 2015. Allowing for portable technique, the lungs are clear.  IMPRESSION: Low lung volumes, otherwise no acute cardiopulmonary abnormality.   Electronically Signed   By: Odessa Fleming M.D.   On: 08/10/2015 19:48   Medications: Infusions:    Scheduled Medications: . dialysis solution 1.5% low-MG/low-CA   Intraperitoneal Q24H  . gentamicin cream  1 application Topical Daily  . heparin  5,000 Units Subcutaneous 3 times per day  . lanthanum  1,000 mg Oral TID WC  . meclizine  25 mg Oral Once  . piperacillin-tazobactam (ZOSYN)  IV  2.25 g Intravenous Q8H  . sodium chloride  3 mL Intravenous Q12H    have reviewed scheduled and prn medications.  Physical Exam: General: sleepy on bipap Heart: RRR Lungs: poor effort Abdomen: obese, soft Extremities: no peripheral edema Dialysis Access: PD catheter    08/12/2015,8:21 AM  LOS: 2 days

## 2015-08-12 NOTE — Progress Notes (Signed)
PROGRESS NOTE  Robert Hays ZOX:[id number] DOB: Apr 17, 1966 DOA: 08/10/2015 PCP: No primary care provider on file.  49 yo male peritoneal dialysis brought in by his son and daughter for concerns of confusion. Pt lives alone, but children report they dont think he really does his peritoneal dialysis at night like he is suppose too. He has been confused over the last several days. He has not mentioned any problems, no n/v/d. Pt denies anything right now but is delirious so history unreliable. Referred for admission for encephalopathy with fever/ams.   Assessment/Plan: Sepsis- peritoneal dialysis patient. Source unclear. Panculture.peritoneal fluid for cell count, culture-- does not appear infected but drawn after abx started-Broad spectrum abx started with vancomycin and zosyn. Lactic acid level nml    Peritoneal dialysis status- nephro   Hypertension- noted, bp stable at this time, hold home meds   Fever- As above   Metabolic encephalopathy- secondary to fever, no meningeal signs.  -holding home meds: bacolfen, cymbalta, Neurontin  leukocytosis -trend  Elevated troponin -suspect related to renal disease/spesis -no CP  prob OSA -needs outpatient sleep study   Code Status: full Family Communication: patient Disposition Plan: tx to med surge on 6E   Consultants:  nephrology  Procedures:      HPI/Subjective: Nursing reports symptoms of sleep apnea- snoring, apnea  Objective: Filed Vitals:   08/12/15 0737  BP:   Pulse:   Temp: 98 F (36.7 C)  Resp:     Intake/Output Summary (Last 24 hours) at 08/12/15 0749 Last data filed at 08/12/15 0558  Gross per 24 hour  Intake    356 ml  Output      0 ml  Net    356 ml   Filed Weights   08/11/15 0045 08/11/15 0536 08/12/15 0500  Weight: 96.8 kg (213 lb 6.5 oz) 96.3 kg (212 lb 4.9 oz) 98.7 kg (217 lb 9.5 oz)    Exam:   General:  Awake, NAD  Cardiovascular: rrr  Respiratory:  clear  Abdomen: +BS, non tender, no peritoneal signs  Musculoskeletal: min edema   Data Reviewed: Basic Metabolic Panel:  Recent Labs Lab 08/10/15 1927 08/11/15 0141 08/11/15 0323 08/12/15 0559  NA 135 134* 134* 132*  K 3.9 4.0 4.0 4.0  CL 93* 94* 92* 91*  CO2 22 21* 20* 22  GLUCOSE 92 96 96 106*  BUN 78* 77* 79* 89*  CREATININE 24.07* 24.84* 25.24* 25.51*  CALCIUM 9.4 9.2 9.3 9.2  PHOS  --   --   --  11.3*   Liver Function Tests:  Recent Labs Lab 08/10/15 2249 08/11/15 0141 08/11/15 0323 08/12/15 0559  AST 9* 11* 9*  --   ALT 14* 13* 14*  --   ALKPHOS 182* 163* 169*  --   BILITOT 1.1 0.8 0.9  --   PROT 6.9 6.2* 6.4*  --   ALBUMIN 3.1* 2.8* 2.7* 2.8*   No results for input(s): LIPASE, AMYLASE in the last 168 hours. No results for input(s): AMMONIA in the last 168 hours. CBC:  Recent Labs Lab 08/10/15 1927 08/11/15 0141 08/11/15 0323 08/12/15 0559  WBC 18.5* 14.4* 14.8* 12.4*  HGB 11.4* 9.7* 9.4* 9.5*  HCT 35.3* 31.2* 30.2* 29.8*  MCV 85.7 85.7 85.6 84.9  PLT 364 318 319 301   Cardiac Enzymes:  Recent Labs Lab 08/11/15 0141  TROPONINI 0.17*   BNP (last 3 results) No results for input(s): BNP in the last 8760 hours.  ProBNP (last 3 results) No results for input(s): PROBNP  in the last 8760 hours.  CBG:  Recent Labs Lab 08/10/15 1857 08/11/15 2021  GLUCAP 97 99    Recent Results (from the past 240 hour(s))  Blood Culture (routine x 2)     Status: None (Preliminary result)   Collection Time: 08/10/15  7:25 PM  Result Value Ref Range Status   Specimen Description BLOOD RIGHT HAND  Final   Special Requests BOTTLES DRAWN AEROBIC AND ANAEROBIC 4CC EACH  Final   Culture NO GROWTH < 12 HOURS  Final   Report Status PENDING  Incomplete  Blood Culture (routine x 2)     Status: None (Preliminary result)   Collection Time: 08/10/15  7:28 PM  Result Value Ref Range Status   Specimen Description BLOOD LEFT HAND  Final   Special Requests BOTTLES  DRAWN AEROBIC AND ANAEROBIC 4CC EACH  Final   Culture NO GROWTH < 12 HOURS  Final   Report Status PENDING  Incomplete  MRSA PCR Screening     Status: None   Collection Time: 08/11/15  1:12 AM  Result Value Ref Range Status   MRSA by PCR NEGATIVE NEGATIVE Final    Comment:        The GeneXpert MRSA Assay (FDA approved for NASAL specimens only), is one component of a comprehensive MRSA colonization surveillance program. It is not intended to diagnose MRSA infection nor to guide or monitor treatment for MRSA infections.   Gram stain     Status: None   Collection Time: 08/11/15 10:16 AM  Result Value Ref Range Status   Specimen Description FLUID PERITONEAL  Final   Special Requests NONE  Final   Gram Stain   Final    DEGENERATED CELLULAR MATERIAL PRESENT NO ORGANISMS SEEN    Report Status 08/11/2015 FINAL  Final     Studies: Ct Head Wo Contrast  08/10/2015   CLINICAL DATA:  Generalized weakness, worsening over the last 3 days. Peritoneal dialysis yesterday. Confusion. Headache.  EXAM: CT HEAD WITHOUT CONTRAST  TECHNIQUE: Contiguous axial images were obtained from the base of the skull through the vertex without intravenous contrast.  COMPARISON:  None.  FINDINGS: The brainstem, cerebellum, cerebral peduncles, thalamus, basal ganglia, basilar cisterns, and ventricular system appear within normal limits. No intracranial hemorrhage, mass lesion, or acute CVA.  Opacification of multiple ethmoid air cells and partial opacification of others. Chronic right frontal sinusitis. Mild chronic left sphenoid sinusitis. Right mastoid effusion with fluid or some type of tissue medially in the right middle ear.  Arthropathy of the left temporomandibular joint.  IMPRESSION: 1. No acute intracranial findings. 2. Right mastoid effusion with fluid or soft tissue density medially in the right middle ear. 3. Chronic right frontal, left sphenoid, and bilateral ethmoid sinusitis. Some of the ethmoid air cells are  completely opacified.   Electronically Signed   By: Gaylyn Rong M.D.   On: 08/10/2015 21:30   Dg Chest Port 1 View  08/10/2015   CLINICAL DATA:  49 year old male with fever and weakness. Dialysis patient. Initial encounter.  EXAM: PORTABLE CHEST - 1 VIEW  COMPARISON:  [facility] portable chest radiograph 10/26/2014 and earlier  FINDINGS: Portable AP upright view at 1928 hrs. Lower lung volumes and mild respiratory motion. Extent to a shin of cardiac size. Other mediastinal contours appear stable. Resolved basilar predominant interstitial opacity compared to 2015. Allowing for portable technique, the lungs are clear.  IMPRESSION: Low lung volumes, otherwise no acute cardiopulmonary abnormality.   Electronically Signed   By:  Odessa Fleming M.D.   On: 08/10/2015 19:48    Scheduled Meds: . dialysis solution 1.5% low-MG/low-CA   Intraperitoneal Q24H  . gentamicin cream  1 application Topical Daily  . heparin  5,000 Units Subcutaneous 3 times per day  . lanthanum  1,000 mg Oral TID WC  . meclizine  25 mg Oral Once  . piperacillin-tazobactam (ZOSYN)  IV  2.25 g Intravenous Q8H  . sodium chloride  3 mL Intravenous Q12H   Continuous Infusions:  Antibiotics Given (last 72 hours)    Date/Time Action Medication Dose Rate   08/11/15 0445 Given   piperacillin-tazobactam (ZOSYN) IVPB 2.25 g 2.25 g 100 mL/hr   08/11/15 1045 Given   vancomycin (VANCOCIN) IVPB 1000 mg/200 mL premix 1,000 mg 200 mL/hr   08/11/15 1213 Given   piperacillin-tazobactam (ZOSYN) IVPB 2.25 g 2.25 g 100 mL/hr   08/11/15 2105 Given   piperacillin-tazobactam (ZOSYN) IVPB 2.25 g 2.25 g 100 mL/hr   08/12/15 0558 Given   piperacillin-tazobactam (ZOSYN) IVPB 2.25 g 2.25 g 100 mL/hr      Principal Problem:   Sepsis Active Problems:   Peritoneal dialysis status   Hypertension   Fever   Metabolic encephalopathy    Time spent: 25 min    Evora Schechter  [facility] Pager [phone] If 7PM-7AM, please  contact night-coverage at [email or web address], password [facility] 08/12/2015, 7:49 AM  LOS: 2 days

## 2015-08-12 NOTE — Progress Notes (Addendum)
Admission note:  Arrival Method: Patient transferred from 3S in bed with staff accompanying. Mental Orientation:  Alert and oriented x 4. Telemetry: N/A Assessment: See doc flow sheets. Skin: Warm, dry and intact, Assessed by two nurses Allyson. IV: Right Hand and Right forearm, saline locked. Pain: Denies any pain currently. Tubes: N/A Safety Measures: Low bed, phone and call light within reach. Fall Prevention Safety Plan: Reviewed the plan, understood and acknowledged. Admission Screening: Complete. 6700 Orientation: Patient has been oriented to the unit, staff and to the room.

## 2015-08-12 NOTE — Progress Notes (Signed)
Patient set up and placed on auto CPAP 20 max, 11 min. Patient is wearing a large full face mask. Patient is tolerating well at this time.

## 2015-08-13 DIAGNOSIS — N19 Unspecified kidney failure: Secondary | ICD-10-CM

## 2015-08-13 LAB — RENAL FUNCTION PANEL
Albumin: 3 g/dL — ABNORMAL LOW (ref 3.5–5.0)
Anion gap: 20 — ABNORMAL HIGH (ref 5–15)
BUN: 66 mg/dL — ABNORMAL HIGH (ref 6–20)
CO2: 24 mmol/L (ref 22–32)
Calcium: 9.5 mg/dL (ref 8.9–10.3)
Chloride: 89 mmol/L — ABNORMAL LOW (ref 101–111)
Creatinine, Ser: 20.76 mg/dL — ABNORMAL HIGH (ref 0.61–1.24)
GFR calc Af Amer: 3 mL/min — ABNORMAL LOW (ref >60)
GFR calc non Af Amer: 2 mL/min — ABNORMAL LOW (ref >60)
Glucose, Bld: 117 mg/dL — ABNORMAL HIGH (ref 65–99)
Phosphorus: 9.5 mg/dL — ABNORMAL HIGH (ref 2.5–4.6)
Potassium: 3.6 mmol/L (ref 3.5–5.1)
Sodium: 133 mmol/L — ABNORMAL LOW (ref 135–145)

## 2015-08-13 LAB — PATHOLOGIST SMEAR REVIEW

## 2015-08-13 MED ORDER — ACETAMINOPHEN 325 MG PO TABS
650.0000 mg | ORAL_TABLET | Freq: Four times a day (QID) | ORAL | Status: DC | PRN
  Administered 2015-08-13: 650 mg via ORAL
  Filled 2015-08-13: qty 2

## 2015-08-13 MED ORDER — CETYLPYRIDINIUM CHLORIDE 0.05 % MT LIQD
7.0000 mL | Freq: Two times a day (BID) | OROMUCOSAL | Status: DC
  Administered 2015-08-13 – 2015-08-15 (×5): 7 mL via OROMUCOSAL

## 2015-08-13 MED ORDER — HYDROCODONE-ACETAMINOPHEN 5-325 MG PO TABS
2.0000 | ORAL_TABLET | Freq: Once | ORAL | Status: AC
  Administered 2015-08-14: 1 via ORAL
  Filled 2015-08-13: qty 2

## 2015-08-13 NOTE — Care Management Important Message (Signed)
Important Message  Patient Details  Name: Robert Hays MRN: [id number] Date of Birth: 1966-03-19   Medicare Important Message Given:  Yes-second notification given    Orson Aloe 08/13/2015, 12:59 PM

## 2015-08-13 NOTE — Evaluation (Signed)
Physical Therapy Evaluation Patient Details Name: Charlene Cowdrey MRN: [id number] DOB: 26-Dec-1965 Today's Date: 08/13/2015   History of Present Illness  49 yo male peritoneal dialysis brought in by his son and daughter for concerns of confusion. Pt lives alone, but children report they dont think he really does his peritoneal dialysis at night like he is suppose too. He has been confused over the last several days. He has not mentioned any problems, no n/v/d. Pt denies anything right now but is delirious so history unreliable. Referred for admission for encephalopathy with fever/ams.   Clinical Impression   Pt admitted with above diagnosis. Pt currently with functional limitations due to the deficits listed below (see PT Problem List).  Pt will benefit from skilled PT to increase their independence and safety with mobility to allow discharge to the venue listed below.       Follow Up Recommendations Home health PT;Other (comment) [facility] for chronic disease management)    Equipment Recommendations  Other (comment) (cane vs RW vs none, to be determined)    Recommendations for Other Services OT consult     Precautions / Restrictions Precautions Precautions: Fall Restrictions Weight Bearing Restrictions: No      Mobility  Bed Mobility Overal bed mobility: Needs Assistance Bed Mobility: Supine to Sit     Supine to sit: Min assist     General bed mobility comments: min handheld assist to pull to sit  Transfers Overall transfer level: Needs assistance Equipment used: 1 person hand held assist Transfers: Sit to/from Stand Sit to Stand: Min assist         General transfer comment: Min assist to steady; incr time and definite need for UE support  Ambulation/Gait Ambulation/Gait assistance: Min assist Ambulation Distance (Feet): 3 Feet Assistive device: 1 person hand held assist       General Gait Details: short steps bed to chair; assist mainly to manage lines and  for steadiness  Stairs            Wheelchair Mobility    Modified Rankin (Stroke Patients Only)       Balance Overall balance assessment: Needs assistance             Standing balance comment: REaches out for UE support in standing                             Pertinent Vitals/Pain Pain Assessment: No/denies pain    Home Living Family/patient expects to be discharged to:: Private residence Living Arrangements: Spouse/significant other Available Help at Discharge: Family;Available PRN/intermittently Type of Home: House Home Access: Stairs to enter Entrance Stairs-Rails: None Entrance Stairs-Number of Steps: 8 Home Layout: Two level;Able to live on main level with bedroom/bathroom Home Equipment: None      Prior Function Level of Independence: Independent         Comments: Had been managing his PD independently at home until before this admission     Hand Dominance        Extremity/Trunk Assessment   Upper Extremity Assessment: Generalized weakness           Lower Extremity Assessment: Generalized weakness         Communication   Communication: No difficulties  Cognition Arousal/Alertness: Awake/alert Behavior During Therapy: WFL for tasks assessed/performed Overall Cognitive Status: Impaired/Different from baseline Area of Impairment: Following commands       Following Commands: Follows one step commands with increased time;Follows one step commands  consistently       General Comments: Slow to answer questions; Much improved from yesterday per Nephrologist    General Comments General comments (skin integrity, edema, etc.): Limited in room mobility as he is on CCPD at time of eval; He did indicate he was more comfortable in chair    Exercises        Assessment/Plan    PT Assessment Patient needs continued PT services  PT Diagnosis Generalized weakness   PT Problem List Decreased strength;Decreased activity  tolerance;Decreased balance;Decreased mobility;Decreased knowledge of use of DME  PT Treatment Interventions DME instruction;Gait training;Stair training;Functional mobility training;Therapeutic activities;Therapeutic exercise;Balance training;Patient/family education   PT Goals (Current goals can be found in the Care Plan section) Acute Rehab PT Goals Patient Stated Goal: wants to get stronger PT Goal Formulation: With patient Time For Goal Achievement: 08/27/15 Potential to Achieve Goals: Good    Frequency Min 3X/week   Barriers to discharge   Pt's son, Joselyn Glassman is concerned for Mr. Chilson ability to safely manage at home, including his PD    Co-evaluation               End of Session   Activity Tolerance: Patient tolerated treatment well Patient left: in chair;with call bell/phone within reach;Other (comment) (Chair alarm pad placed; no box in room) Nurse Communication: Mobility status         Time: 1610-9604 PT Time Calculation (min) (ACUTE ONLY): 23 min   Charges:   PT Evaluation $Initial PT Evaluation Tier I: 1 Procedure PT Treatments $Therapeutic Activity: 8-22 mins   PT G Codes:        Van Clines Hamff 08/13/2015, 11:25 AM  Van Clines, PT  Acute Rehabilitation Services Pager [phone] Office [phone]

## 2015-08-13 NOTE — Progress Notes (Signed)
Subjective:  Temp curve improving- CCPD continued overnight no labs this AM - seems much better mentally - able to give me details regarding his PD   Objective Vital signs in last 24 hours: Filed Vitals:   08/12/15 1615 08/12/15 2053 08/13/15 0523 08/13/15 0840  BP: 140/75 144/81 130/81 146/83  Pulse: 95 89 84 90  Temp: 98.3 F (36.8 C) 99.1 F (37.3 C) 97.8 F (36.6 C) 98.9 F (37.2 C)  TempSrc: Oral Oral Oral Oral  Resp: 16 17 18 17   Height:      Weight:  99.2 kg (218 lb 11.1 oz)    SpO2: 97% 94% 100% 95%   Weight change: 0.5 kg (1 lb 1.6 oz)  Intake/Output Summary (Last 24 hours) at 08/13/15 1057 Last data filed at 08/13/15 0856  Gross per 24 hour  Intake  40981 ml  Output  13623 ml  Net   -153 ml    Assessment/Plan: 49 year old black male with reported ESRD on peritoneal dialysis for 3 years. He is hospitalized for decreased mental status  1 decreased mental status - possibly secondary to uremia. There could also be medication effect with Neurontin/Lyrica/baclofen. Those medications are currently on hold. We will continue with dialysis which should help his mental status if it is due to uremia.  2 ESRD: On peritoneal dialysis. He does not appear to have peritonitis. We will continue and be more aggressive with the dialysis with Hays cycler over the next 24 hours- then look to resume his regular schedule tomorrow  3 Hypertension: Is higher at this time. resumed Toprol. I will use combo of 2.5% and  1.5% PD fluid  4. Anemia of ESRD: Just slightly under goal at this time. Iron low - will replete and give the ESA  5. Metabolic Bone Disease: We'll continue his outpatient Fosrenol dosing 6. ID- elevated WBC- unknown source- cultures pending- on zosyn/vanc per primary team - WBC trending down    Robert Hays    Labs: Basic Metabolic Panel:  Recent Labs Lab 08/11/15 0141 08/11/15 0323 08/12/15 0559  NA 134* 134* 132*  K 4.0 4.0 4.0  CL 94* 92* 91*  CO2 21*  20* 22  GLUCOSE 96 96 106*  BUN 77* 79* 89*  CREATININE 24.84* 25.24* 25.51*  CALCIUM 9.2 9.3 9.2  PHOS  --   --  11.3*   Liver Function Tests:  Recent Labs Lab 08/10/15 2249 08/11/15 0141 08/11/15 0323 08/12/15 0559  AST 9* 11* 9*  --   ALT 14* 13* 14*  --   ALKPHOS 182* 163* 169*  --   BILITOT 1.1 0.8 0.9  --   PROT 6.9 6.2* 6.4*  --   ALBUMIN 3.1* 2.8* 2.7* 2.8*   No results for input(s): LIPASE, AMYLASE in the last 168 hours. No results for input(s): AMMONIA in the last 168 hours. CBC:  Recent Labs Lab 08/10/15 1927 08/11/15 0141 08/11/15 0323 08/12/15 0559  WBC 18.5* 14.4* 14.8* 12.4*  HGB 11.4* 9.7* 9.4* 9.5*  HCT 35.3* 31.2* 30.2* 29.8*  MCV 85.7 85.7 85.6 84.9  PLT 364 318 319 301   Cardiac Enzymes:  Recent Labs Lab 08/11/15 0141  TROPONINI 0.17*   CBG:  Recent Labs Lab 08/10/15 1857 08/11/15 2021  GLUCAP 97 99    Iron Studies:   Recent Labs  08/12/15 0559  IRON 17*  TIBC 207*  FERRITIN 191   Studies/Results: No results found. Medications: Infusions:    Scheduled Medications: . antiseptic oral rinse  7  mL Mouth Rinse BID  . darbepoetin (ARANESP) injection - DIALYSIS  60 mcg Subcutaneous Q Tue-1800  . dialysis solution 1.5% low-MG/low-CA   Intraperitoneal Q24H  . dialysis solution for CAPD/CCPD [facility])   Peritoneal Dialysis Daily  . ferric gluconate (FERRLECIT/NULECIT) IV  125 mg Intravenous Daily  . gentamicin cream  1 application Topical Daily  . heparin  5,000 Units Subcutaneous 3 times per day  . lanthanum  1,000 mg Oral TID WC  . meclizine  25 mg Oral Once  . metoprolol succinate  25 mg Oral Daily  . piperacillin-tazobactam (ZOSYN)  IV  2.25 g Intravenous Q8H  . sodium chloride  3 mL Intravenous Q12H    have reviewed scheduled and prn medications.  Physical Exam: General: sleepy on bipap Heart: RRR Lungs: poor effort Abdomen: obese, soft Extremities: no peripheral edema Dialysis Access: PD catheter     08/13/2015,10:57 AM  LOS: 3 days

## 2015-08-13 NOTE — Progress Notes (Signed)
PROGRESS NOTE  Robert Hays ZOX:[id number] DOB: 1966/09/04 DOA: 08/10/2015 PCP: No primary care provider on file.  49 yo male peritoneal dialysis brought in by his son and daughter for concerns of confusion. Pt lives alone, but children report they dont think he really does his peritoneal dialysis at night like he is suppose too. He has been confused over the last several days. He has not mentioned any problems, no n/v/d. Pt denies anything right now but is delirious so history unreliable. Referred for admission for encephalopathy with fever/ams.   Assessment/Plan: Sepsis vs uremia- resolved. peritoneal dialysis patient. Source unclear. Panculture.peritoneal fluid for cell count, culture-- does not appear infected but drawn after abx started-Broad spectrum abx started with vancomycin and zosyn. Lactic acid level nml  -improving with PD-- patient says he was doing every night but not staying on long enough   Peritoneal dialysis status- nephro consult   Hypertension- noted, bp stable at this time,resume home med   Fever- As above, resolved   Metabolic encephalopathy- secondary to fever, no meningeal signs.  -holding home meds: bacolfen, cymbalta, Neurontin- patient states he was taking some meds that he was not supposed to be taking  leukocytosis -trend  Elevated troponin -suspect related to renal disease/spesis -no CP  prob severe OSA -needs outpatient sleep study  Headache -tylenol PRN  PT Eval  Code Status: full Family Communication: patient Disposition Plan: d/c 1-2 days   Consultants:  nephrology  Procedures:      HPI/Subjective: C/o headache- had for "a minute"  Objective: Filed Vitals:   08/13/15 0523  BP: 130/81  Pulse: 84  Temp: 97.8 F (36.6 C)  Resp: 18    Intake/Output Summary (Last 24 hours) at 08/13/15 0752 Last data filed at 08/13/15 0639  Gross per 24 hour  Intake  81191 ml  Output  13623 ml  Net   -153 ml   Filed  Weights   08/11/15 0536 08/12/15 0500 08/12/15 2053  Weight: 96.3 kg (212 lb 4.9 oz) 98.7 kg (217 lb 9.5 oz) 99.2 kg (218 lb 11.1 oz)    Exam:   General:  Awake, NAD  Cardiovascular: rrr  Respiratory: clear  Abdomen: +BS, non tender, no peritoneal signs  Musculoskeletal: min edema   Data Reviewed: Basic Metabolic Panel:  Recent Labs Lab 08/10/15 1927 08/11/15 0141 08/11/15 0323 08/12/15 0559  NA 135 134* 134* 132*  K 3.9 4.0 4.0 4.0  CL 93* 94* 92* 91*  CO2 22 21* 20* 22  GLUCOSE 92 96 96 106*  BUN 78* 77* 79* 89*  CREATININE 24.07* 24.84* 25.24* 25.51*  CALCIUM 9.4 9.2 9.3 9.2  PHOS  --   --   --  11.3*   Liver Function Tests:  Recent Labs Lab 08/10/15 2249 08/11/15 0141 08/11/15 0323 08/12/15 0559  AST 9* 11* 9*  --   ALT 14* 13* 14*  --   ALKPHOS 182* 163* 169*  --   BILITOT 1.1 0.8 0.9  --   PROT 6.9 6.2* 6.4*  --   ALBUMIN 3.1* 2.8* 2.7* 2.8*   No results for input(s): LIPASE, AMYLASE in the last 168 hours. No results for input(s): AMMONIA in the last 168 hours. CBC:  Recent Labs Lab 08/10/15 1927 08/11/15 0141 08/11/15 0323 08/12/15 0559  WBC 18.5* 14.4* 14.8* 12.4*  HGB 11.4* 9.7* 9.4* 9.5*  HCT 35.3* 31.2* 30.2* 29.8*  MCV 85.7 85.7 85.6 84.9  PLT 364 318 319 301   Cardiac Enzymes:  Recent Labs Lab 08/11/15  0141  TROPONINI 0.17*   BNP (last 3 results) No results for input(s): BNP in the last 8760 hours.  ProBNP (last 3 results) No results for input(s): PROBNP in the last 8760 hours.  CBG:  Recent Labs Lab 08/10/15 1857 08/11/15 2021  GLUCAP 97 99    Recent Results (from the past 240 hour(s))  Blood Culture (routine x 2)     Status: None (Preliminary result)   Collection Time: 08/10/15  7:25 PM  Result Value Ref Range Status   Specimen Description BLOOD RIGHT HAND  Final   Special Requests BOTTLES DRAWN AEROBIC AND ANAEROBIC 4CC EACH  Final   Culture NO GROWTH 2 DAYS  Final   Report Status PENDING  Incomplete   Blood Culture (routine x 2)     Status: None (Preliminary result)   Collection Time: 08/10/15  7:28 PM  Result Value Ref Range Status   Specimen Description BLOOD LEFT HAND  Final   Special Requests BOTTLES DRAWN AEROBIC AND ANAEROBIC 4CC EACH  Final   Culture NO GROWTH 2 DAYS  Final   Report Status PENDING  Incomplete  MRSA PCR Screening     Status: None   Collection Time: 08/11/15  1:12 AM  Result Value Ref Range Status   MRSA by PCR NEGATIVE NEGATIVE Final    Comment:        The GeneXpert MRSA Assay (FDA approved for NASAL specimens only), is one component of a comprehensive MRSA colonization surveillance program. It is not intended to diagnose MRSA infection nor to guide or monitor treatment for MRSA infections.   Culture, body fluid-bottle     Status: None (Preliminary result)   Collection Time: 08/11/15 10:16 AM  Result Value Ref Range Status   Specimen Description FLUID PERITONEAL  Final   Special Requests NONE  Final   Culture NO GROWTH 1 DAY  Final   Report Status PENDING  Incomplete  Gram stain     Status: None   Collection Time: 08/11/15 10:16 AM  Result Value Ref Range Status   Specimen Description FLUID PERITONEAL  Final   Special Requests NONE  Final   Gram Stain   Final    DEGENERATED CELLULAR MATERIAL PRESENT NO ORGANISMS SEEN    Report Status 08/11/2015 FINAL  Final     Studies: No results found.  Scheduled Meds: . antiseptic oral rinse  7 mL Mouth Rinse BID  . darbepoetin (ARANESP) injection - DIALYSIS  60 mcg Subcutaneous Q Tue-1800  . dialysis solution 1.5% low-MG/low-CA   Intraperitoneal Q24H  . dialysis solution for CAPD/CCPD [facility])   Peritoneal Dialysis Daily  . ferric gluconate (FERRLECIT/NULECIT) IV  125 mg Intravenous Daily  . gentamicin cream  1 application Topical Daily  . heparin  5,000 Units Subcutaneous 3 times per day  . lanthanum  1,000 mg Oral TID WC  . meclizine  25 mg Oral Once  . metoprolol succinate  25 mg Oral Daily  .  piperacillin-tazobactam (ZOSYN)  IV  2.25 g Intravenous Q8H  . sodium chloride  3 mL Intravenous Q12H   Continuous Infusions:  Antibiotics Given (last 72 hours)    Date/Time Action Medication Dose Rate   08/11/15 0445 Given   piperacillin-tazobactam (ZOSYN) IVPB 2.25 g 2.25 g 100 mL/hr   08/11/15 1045 Given   vancomycin (VANCOCIN) IVPB 1000 mg/200 mL premix 1,000 mg 200 mL/hr   08/11/15 1213 Given   piperacillin-tazobactam (ZOSYN) IVPB 2.25 g 2.25 g 100 mL/hr   08/11/15 2105 Given  piperacillin-tazobactam (ZOSYN) IVPB 2.25 g 2.25 g 100 mL/hr   08/12/15 0558 Given   piperacillin-tazobactam (ZOSYN) IVPB 2.25 g 2.25 g 100 mL/hr   08/12/15 1100 Given   piperacillin-tazobactam (ZOSYN) IVPB 2.25 g 2.25 g 100 mL/hr   08/12/15 1953 Given   piperacillin-tazobactam (ZOSYN) IVPB 2.25 g 2.25 g 100 mL/hr   08/13/15 0429 Given   piperacillin-tazobactam (ZOSYN) IVPB 2.25 g 2.25 g 100 mL/hr      Principal Problem:   Sepsis Active Problems:   Peritoneal dialysis status   Hypertension   Fever   Metabolic encephalopathy    Time spent: 25 min    Tanelle Lanzo  [facility] Pager [phone] If 7PM-7AM, please contact night-coverage at [email or web address], password [facility] 08/13/2015, 7:52 AM  LOS: 3 days

## 2015-08-13 NOTE — Plan of Care (Signed)
Problem: Phase I Progression Outcomes Goal: Voiding-avoid urinary catheter unless indicated Outcome: Not Applicable Date Met:  23/41/44 Patient anuric.

## 2015-08-14 DIAGNOSIS — R519 Headache, unspecified: Secondary | ICD-10-CM

## 2015-08-14 DIAGNOSIS — R51 Headache: Secondary | ICD-10-CM

## 2015-08-14 LAB — CBC
HCT: 34.9 % — ABNORMAL LOW (ref 39.0–52.0)
HEMOGLOBIN: 11 g/dL — AB (ref 13.0–17.0)
MCH: 27.1 pg (ref 26.0–34.0)
MCHC: 31.5 g/dL (ref 30.0–36.0)
MCV: 86 fL (ref 78.0–100.0)
Platelets: 388 10*3/uL (ref 150–400)
RBC: 4.06 MIL/uL — ABNORMAL LOW (ref 4.22–5.81)
RDW: 15.3 % (ref 11.5–15.5)
WBC: 12.5 10*3/uL — ABNORMAL HIGH (ref 4.0–10.5)

## 2015-08-14 LAB — BASIC METABOLIC PANEL
Anion gap: 20 — ABNORMAL HIGH (ref 5–15)
BUN: 55 mg/dL — AB (ref 6–20)
CHLORIDE: 91 mmol/L — AB (ref 101–111)
CO2: 25 mmol/L (ref 22–32)
CREATININE: 19.08 mg/dL — AB (ref 0.61–1.24)
Calcium: 9.6 mg/dL (ref 8.9–10.3)
GFR calc Af Amer: 3 mL/min — ABNORMAL LOW (ref >60)
GFR calc non Af Amer: 2 mL/min — ABNORMAL LOW (ref >60)
GLUCOSE: 104 mg/dL — AB (ref 65–99)
Potassium: 3.1 mmol/L — ABNORMAL LOW (ref 3.5–5.1)
SODIUM: 136 mmol/L (ref 135–145)

## 2015-08-14 MED ORDER — METOPROLOL SUCCINATE ER 25 MG PO TB24
25.0000 mg | ORAL_TABLET | Freq: Every day | ORAL | Status: DC
  Administered 2015-08-15: 25 mg via ORAL
  Filled 2015-08-14 (×2): qty 1

## 2015-08-14 MED ORDER — SODIUM CHLORIDE 0.9 % IV BOLUS (SEPSIS)
500.0000 mL | Freq: Once | INTRAVENOUS | Status: AC
  Administered 2015-08-14: 500 mL via INTRAVENOUS

## 2015-08-14 MED ORDER — HEPARIN 1000 UNIT/ML FOR PERITONEAL DIALYSIS
1500.0000 [IU] | INTRAMUSCULAR | Status: DC | PRN
  Filled 2015-08-14: qty 1.5

## 2015-08-14 MED ORDER — AMOXICILLIN-POT CLAVULANATE 875-125 MG PO TABS: 1.0000 | ORAL_TABLET | Freq: Every day | ORAL | Status: DC

## 2015-08-14 MED ORDER — LORATADINE 10 MG PO TABS
10.0000 mg | ORAL_TABLET | Freq: Every day | ORAL | Status: DC
  Administered 2015-08-14 – 2015-08-15 (×2): 10 mg via ORAL
  Filled 2015-08-14 (×2): qty 1

## 2015-08-14 MED ORDER — DELFLEX-LC/1.5% DEXTROSE 346 MOSM/L IP SOLN: INTRAPERITONEAL | Status: DC

## 2015-08-14 MED ORDER — AMOXICILLIN-POT CLAVULANATE 500-125 MG PO TABS
1.0000 | ORAL_TABLET | Freq: Every day | ORAL | Status: DC
  Administered 2015-08-14 – 2015-08-15 (×2): 500 mg via ORAL
  Filled 2015-08-14 (×2): qty 1

## 2015-08-14 NOTE — Progress Notes (Signed)
Late Entry  BP 85/51 - 88/69 with HR 110's this AM. Patient asymptomatic. Dr. Benjamine Mola notified; parameters added to beta blockers. Dr. Kathrene Bongo notified - orders received for 500 cc NS bolus and to stop CCPD until later this evening. After bolus was completed, rechecked BP 103/61, HR 109. Will continue to monitor.  Leanna Battles, RN.

## 2015-08-14 NOTE — Progress Notes (Signed)
Placed patient on CPAP auto 11-20cmH20 via large full face mask for the night.  Patient is tolerating well at this time.

## 2015-08-14 NOTE — Progress Notes (Signed)
Subjective:  Temp resolved- CCPD continued overnight - some low blood pressures this AM- hold PD and gave him some fluid back  Objective Vital signs in last 24 hours: Filed Vitals:   08/14/15 0444 08/14/15 0922 08/14/15 0925 08/14/15 0927  BP: 108/75 85/51 87/58  88/69  Pulse: 84 111 116 114  Temp: 98.3 F (36.8 C) 98.5 F (36.9 C)    TempSrc: Oral Oral    Resp: Height:      Weight:      SpO2: 100% 100% 100% 100%   Weight change:   Intake/Output Summary (Last 24 hours) at 08/14/15 1048 Last data filed at 08/14/15 0929  Gross per 24 hour  Intake  16109 ml  Output  26110 ml  Net  -1377 ml    Assessment/Plan: 49 year old black male with reported ESRD on peritoneal dialysis for 3 years. He is hospitalized for decreased mental status  1 decreased mental status - possibly secondary to uremia. There could also be medication effect with Neurontin/Lyrica/baclofen. Those medications are currently on hold. Has resolved 2 ESRD: On peritoneal dialysis. He does not appear to have peritonitis. His blood pressure may be too low as a result of aggressive UF- holding PD- resume regular schedule tonight- 6 exchanges over 10 hours 3 Hypertension: On low dose Toprol only. I will use 1.5% PD fluid only , liberalize diet - no fluid restriction 4. Anemia of ESRD: Just slightly under goal at this time. Iron low - will replete and give the ESA  5. Metabolic Bone Disease: We'll continue his outpatient Fosrenol dosing 6. ID- elevated WBC- unknown source- cultures pending- on augmentin 7. Dispo- possible discharge soon- ? Will need HH PT vs CIR due to weakness    Rehmat Murtagh A    Labs: Basic Metabolic Panel:  Recent Labs Lab 08/12/15 0559 08/13/15 1155 08/14/15 0600  NA 132* 133* 136  K 4.0 3.6 3.1*  CL 91* 89* 91*  CO2 GLUCOSE 106* 117* 104*  BUN 89* 66* 55*  CREATININE 25.51* 20.76* 19.08*  CALCIUM 9.2 9.5 9.6  PHOS 11.3* 9.5*  --    Liver Function  Tests:  Recent Labs Lab 08/10/15 2249 08/11/15 0141 08/11/15 0323 08/12/15 0559 08/13/15 1155  AST 9* 11* 9*  --   --   ALT 14* 13* 14*  --   --   ALKPHOS 182* 163* 169*  --   --   BILITOT 1.1 0.8 0.9  --   --   PROT 6.9 6.2* 6.4*  --   --   ALBUMIN 3.1* 2.8* 2.7* 2.8* 3.0*   No results for input(s): LIPASE, AMYLASE in the last 168 hours. No results for input(s): AMMONIA in the last 168 hours. CBC:  Recent Labs Lab 08/10/15 1927 08/11/15 0141 08/11/15 0323 08/12/15 0559 08/14/15 0600  WBC 18.5* 14.4* 14.8* 12.4* 12.5*  HGB 11.4* 9.7* 9.4* 9.5* 11.0*  HCT 35.3* 31.2* 30.2* 29.8* 34.9*  MCV 85.7 85.7 85.6 84.9 86.0  PLT 364 318 319 301 388   Cardiac Enzymes:  Recent Labs Lab 08/11/15 0141  TROPONINI 0.17*   CBG:  Recent Labs Lab 08/10/15 1857 08/11/15 2021  GLUCAP 97 99    Iron Studies:   Recent Labs  08/12/15 0559  IRON 17*  TIBC 207*  FERRITIN 191   Studies/Results: No results found. Medications: Infusions:    Scheduled Medications: . amoxicillin-clavulanate  1 tablet Oral Daily  . antiseptic oral rinse  7 mL Mouth  Rinse BID  . darbepoetin (ARANESP) injection - DIALYSIS  60 mcg Subcutaneous Q Tue-1800  . dialysis solution 1.5% low-MG/low-CA   Intraperitoneal Q24H  . dialysis solution for CAPD/CCPD [facility])   Peritoneal Dialysis Daily  . ferric gluconate (FERRLECIT/NULECIT) IV  125 mg Intravenous Daily  . gentamicin cream  1 application Topical Daily  . heparin  5,000 Units Subcutaneous 3 times per day  . lanthanum  1,000 mg Oral TID WC  . loratadine  10 mg Oral Daily  . meclizine  25 mg Oral Once  . metoprolol succinate  25 mg Oral Daily  . sodium chloride  500 mL Intravenous Once  . sodium chloride  3 mL Intravenous Q12H    have reviewed scheduled and prn medications.  Physical Exam: General: alert  Heart: RRR Lungs: poor effort Abdomen: obese, soft Extremities: no peripheral edema Dialysis Access: PD catheter     08/14/2015,10:48 AM  LOS: 4 days

## 2015-08-14 NOTE — Progress Notes (Signed)
PROGRESS NOTE  Robert Hays ZOX:[id number] DOB: 1966-02-21 DOA: 08/10/2015 PCP: No primary care provider on file.  49 yo male peritoneal dialysis brought in by his son and daughter for concerns of confusion. Pt lives alone, but children report they dont think he really does his peritoneal dialysis at night like he is suppose too. He has been confused over the last several days. He has not mentioned any problems, no n/v/d. Pt denies anything right now but is delirious so history unreliable. Referred for admission for encephalopathy with fever/ams.   Assessment/Plan: Sepsis- peritoneal dialysis patient. Source unclear. Panculture.has sinusitis, peritoneal fluid for cell count, culture-- does not appear infected but drawn after abx started-Broad spectrum abx started with vancomycin and zosyn- day #5-- will try PO. Lactic acid level nml   Peritoneal dialysis status- nephro  Headache- try pain meds to see if relieved No meningismus   Hypertension- noted, bp stable at this time, hold home meds   Fever- As above   Metabolic encephalopathy- suspect uremia  -holding home meds: bacolfen, cymbalta, Neurontin  leukocytosis -trend, no fevers  Elevated troponin -suspect related to renal disease/spesis -no CP  prob OSA -outpatient sleep study arranged  PT Evaluation   Code Status: full Family Communication: patient Disposition Plan:    Consultants:  nephrology  Procedures:      HPI/Subjective: C/o headache this AM  Objective: Filed Vitals:   08/14/15 0444  BP: 108/75  Pulse: 84  Temp: 98.3 F (36.8 C)  Resp: 19    Intake/Output Summary (Last 24 hours) at 08/14/15 0913 Last data filed at 08/14/15 0624  Gross per 24 hour  Intake  81191 ml  Output  26110 ml  Net  -1027 ml   Filed Weights   08/11/15 0536 08/12/15 0500 08/12/15 2053  Weight: 96.3 kg (212 lb 4.9 oz) 98.7 kg (217 lb 9.5 oz) 99.2 kg (218 lb 11.1 oz)    Exam:   General:  Awake,  NAD  Cardiovascular: rrr  Respiratory: clear  Abdomen: +BS, non tender, no peritoneal signs  Musculoskeletal: min edema   Data Reviewed: Basic Metabolic Panel:  Recent Labs Lab 08/11/15 0141 08/11/15 0323 08/12/15 0559 08/13/15 1155 08/14/15 0600  NA 134* 134* 132* 133* 136  K 4.0 4.0 4.0 3.6 3.1*  CL 94* 92* 91* 89* 91*  CO2 21* 20* 22 24 25   GLUCOSE 96 96 106* 117* 104*  BUN 77* 79* 89* 66* 55*  CREATININE 24.84* 25.24* 25.51* 20.76* 19.08*  CALCIUM 9.2 9.3 9.2 9.5 9.6  PHOS  --   --  11.3* 9.5*  --    Liver Function Tests:  Recent Labs Lab 08/10/15 2249 08/11/15 0141 08/11/15 0323 08/12/15 0559 08/13/15 1155  AST 9* 11* 9*  --   --   ALT 14* 13* 14*  --   --   ALKPHOS 182* 163* 169*  --   --   BILITOT 1.1 0.8 0.9  --   --   PROT 6.9 6.2* 6.4*  --   --   ALBUMIN 3.1* 2.8* 2.7* 2.8* 3.0*   No results for input(s): LIPASE, AMYLASE in the last 168 hours. No results for input(s): AMMONIA in the last 168 hours. CBC:  Recent Labs Lab 08/10/15 1927 08/11/15 0141 08/11/15 0323 08/12/15 0559 08/14/15 0600  WBC 18.5* 14.4* 14.8* 12.4* 12.5*  HGB 11.4* 9.7* 9.4* 9.5* 11.0*  HCT 35.3* 31.2* 30.2* 29.8* 34.9*  MCV 85.7 85.7 85.6 84.9 86.0  PLT 364 318 319 301 388  Cardiac Enzymes:  Recent Labs Lab 08/11/15 0141  TROPONINI 0.17*   BNP (last 3 results) No results for input(s): BNP in the last 8760 hours.  ProBNP (last 3 results) No results for input(s): PROBNP in the last 8760 hours.  CBG:  Recent Labs Lab 08/10/15 1857 08/11/15 2021  GLUCAP 97 99    Recent Results (from the past 240 hour(s))  Blood Culture (routine x 2)     Status: None (Preliminary result)   Collection Time: 08/10/15  7:25 PM  Result Value Ref Range Status   Specimen Description BLOOD RIGHT HAND  Final   Special Requests BOTTLES DRAWN AEROBIC AND ANAEROBIC 4CC EACH  Final   Culture NO GROWTH 3 DAYS  Final   Report Status PENDING  Incomplete  Blood Culture (routine x  2)     Status: None (Preliminary result)   Collection Time: 08/10/15  7:28 PM  Result Value Ref Range Status   Specimen Description BLOOD LEFT HAND  Final   Special Requests BOTTLES DRAWN AEROBIC AND ANAEROBIC 4CC EACH  Final   Culture NO GROWTH 3 DAYS  Final   Report Status PENDING  Incomplete  MRSA PCR Screening     Status: None   Collection Time: 08/11/15  1:12 AM  Result Value Ref Range Status   MRSA by PCR NEGATIVE NEGATIVE Final    Comment:        The GeneXpert MRSA Assay (FDA approved for NASAL specimens only), is one component of a comprehensive MRSA colonization surveillance program. It is not intended to diagnose MRSA infection nor to guide or monitor treatment for MRSA infections.   Culture, body fluid-bottle     Status: None (Preliminary result)   Collection Time: 08/11/15 10:16 AM  Result Value Ref Range Status   Specimen Description FLUID PERITONEAL  Final   Special Requests NONE  Final   Culture NO GROWTH 2 DAYS  Final   Report Status PENDING  Incomplete  Gram stain     Status: None   Collection Time: 08/11/15 10:16 AM  Result Value Ref Range Status   Specimen Description FLUID PERITONEAL  Final   Special Requests NONE  Final   Gram Stain   Final    DEGENERATED CELLULAR MATERIAL PRESENT NO ORGANISMS SEEN    Report Status 08/11/2015 FINAL  Final     Studies: No results found.  Scheduled Meds: . antiseptic oral rinse  7 mL Mouth Rinse BID  . darbepoetin (ARANESP) injection - DIALYSIS  60 mcg Subcutaneous Q Tue-1800  . dialysis solution 1.5% low-MG/low-CA   Intraperitoneal Q24H  . dialysis solution for CAPD/CCPD [facility])   Peritoneal Dialysis Daily  . ferric gluconate (FERRLECIT/NULECIT) IV  125 mg Intravenous Daily  . gentamicin cream  1 application Topical Daily  . heparin  5,000 Units Subcutaneous 3 times per day  . lanthanum  1,000 mg Oral TID WC  . loratadine  10 mg Oral Daily  . meclizine  25 mg Oral Once  . metoprolol succinate  25 mg Oral  Daily  . piperacillin-tazobactam (ZOSYN)  IV  2.25 g Intravenous Q8H  . sodium chloride  3 mL Intravenous Q12H   Continuous Infusions:  Antibiotics Given (last 72 hours)    Date/Time Action Medication Dose Rate   08/11/15 1045 Given   vancomycin (VANCOCIN) IVPB 1000 mg/200 mL premix 1,000 mg 200 mL/hr   08/11/15 1213 Given   piperacillin-tazobactam (ZOSYN) IVPB 2.25 g 2.25 g 100 mL/hr   08/11/15 2105 Given  piperacillin-tazobactam (ZOSYN) IVPB 2.25 g 2.25 g 100 mL/hr   08/12/15 0558 Given   piperacillin-tazobactam (ZOSYN) IVPB 2.25 g 2.25 g 100 mL/hr   08/12/15 1100 Given   piperacillin-tazobactam (ZOSYN) IVPB 2.25 g 2.25 g 100 mL/hr   08/12/15 1953 Given   piperacillin-tazobactam (ZOSYN) IVPB 2.25 g 2.25 g 100 mL/hr   08/13/15 0429 Given   piperacillin-tazobactam (ZOSYN) IVPB 2.25 g 2.25 g 100 mL/hr   08/13/15 1141 Given   piperacillin-tazobactam (ZOSYN) IVPB 2.25 g 2.25 g 100 mL/hr   08/13/15 2024 Given   piperacillin-tazobactam (ZOSYN) IVPB 2.25 g 2.25 g 100 mL/hr   08/14/15 0349 Given   piperacillin-tazobactam (ZOSYN) IVPB 2.25 g 2.25 g 100 mL/hr      Principal Problem:   Sepsis Active Problems:   Peritoneal dialysis status   Hypertension   Fever   Metabolic encephalopathy    Time spent: 25 min    Jesly Hartmann  [facility] Pager [phone] If 7PM-7AM, please contact night-coverage at [email or web address], password [facility] 08/14/2015, 9:13 AM  LOS: 4 days

## 2015-08-14 NOTE — Progress Notes (Signed)
Placed pt on cpap, tolerating well at this time 

## 2015-08-14 NOTE — Progress Notes (Signed)
Patient continues to complain of severe headaches. Dr. Benjamine Mola notified who stated she would order him something for sinusitis. Also, patient had a one time dose of 2 norco tablets that was not given last PM - Dr. Benjamine Mola stated it was okay to give this dose and then re-assess patient's pain. Patient would only agree to take 1 tablet. Will continue to monitor.  Leanna Battles, RN.

## 2015-08-15 LAB — CULTURE, BLOOD (ROUTINE X 2)
CULTURE: NO GROWTH
CULTURE: NO GROWTH

## 2015-08-15 MED ORDER — AMOXICILLIN-POT CLAVULANATE 500-125 MG PO TABS: 1.0000 | ORAL_TABLET | Freq: Every day | ORAL | Status: DC

## 2015-08-15 MED ORDER — LORATADINE 10 MG PO TABS: 10.0000 mg | ORAL_TABLET | Freq: Every day | ORAL | Status: DC

## 2015-08-15 NOTE — Care Management Note (Signed)
Case Management Note  Patient Details  Name: Robert Hays MRN: [id number] Date of Birth: 1966/01/27  Subjective/Objective:        CM continuing to follow pt for progression and d/c planning.            Action/Plan: 08/15/2015 Plan for d/c to home with [facility] services, CPAP and rolling walker. Steep study to be scheduled at [facility] for early November. That lab will contact pt to schedule a time due to pt dialysis needs.   Expected Discharge Date:  08/15/2015               Expected Discharge Plan:  Home w Home Health Services  In-House Referral:  NA  Discharge planning Services  CM Consult  Post Acute Care Choice:  Durable Medical Equipment Choice offered to:     DME Arranged:  Continuous positive airway pressure (CPAP), Walker rolling DME Agency:  [facility]  HH Arranged:  RN, PT, OT, Nurse's Aide HH Agency:  [facility]  Status of Service:  Completed, signed off  Medicare Important Message Given:  Yes-second notification given Date Medicare IM Given:    Medicare IM give by:    Date Additional Medicare IM Given:    Additional Medicare Important Message give by:     If discussed at Long Length of Stay Meetings, dates discussed:    Additional Comments:  Starlyn Skeans, RN 08/15/2015, 3:06 PM

## 2015-08-15 NOTE — Discharge Summary (Addendum)
Physician Discharge Summary  Robert Hays YQM:[id number] DOB: 1965-12-16 DOA: 08/10/2015  PCP: Kirstie Peri, MD  Admit date: 08/10/2015 Discharge date: 08/15/2015  Time spent: 35 minutes  Recommendations for Outpatient Follow-up:  1. Sleep apnea study and CPAP arranged 2. Home health  Discharge Diagnoses:  Principal Problem:   Sepsis Active Problems:   Peritoneal dialysis status   Hypertension   Fever   Metabolic encephalopathy   Headache   Discharge Condition: improved  Diet recommendation: cardiac  Filed Weights   08/11/15 0536 08/12/15 0500 08/12/15 2053  Weight: 96.3 kg (212 lb 4.9 oz) 98.7 kg (217 lb 9.5 oz) 99.2 kg (218 lb 11.1 oz)    History of present illness:  49 yo male peritoneal dialysis brought in by his son and daughter for concerns of confusion. Pt lives alone, but children report they dont think he really does his peritoneal dialysis at night like he is suppose too. He has been confused over the last several days. He has not mentioned any problems, no n/v/d. Pt denies anything right now but is delirious so history unreliable. His vitals are stable, pt responsive but falls asleep easily. Referred for admission for encephalopathy with fever/ams.   Hospital Course:  Sepsis- peritoneal dialysis patient. Source unclear. Panculture negative.has sinusitis, peritoneal fluid for cell count, culture-- does not appear infected but drawn after abx started- Lactic acid level nml  Finish abx with augmentin  Peritoneal dialysis status- nephro  Headache- resolved Suspect related to sleep apnea  Hypertension- resume home meds   Fever- As above   Metabolic encephalopathy- suspect uremia -d/c home meds: bacolfen, cymbalta, Neurontin  leukocytosis -trend, no fevers  Elevated troponin -suspect related to renal disease/spesis -no CP  prob OSA -outpatient sleep study arranged as well as  CPAP  Procedures:    Consultations:  renal  Discharge Exam: Filed Vitals:   08/15/15 0816  BP: 113/56  Pulse: 102  Temp: 99 F (37.2 C)  Resp: 17      Discharge Instructions   Discharge Instructions    Diet - low sodium heart healthy    Complete by:  As directed      Discharge instructions    Complete by:  As directed   Continue PD- 10 hours/night Renal diet Cbc, bmp 1 week     Home sleep test    Complete by:  As directed   Where should this test be performed:  [facility]     Increase activity slowly    Complete by:  As directed           Current Discharge Medication List    START taking these medications   Details  amoxicillin-clavulanate (AUGMENTIN) 500-125 MG per tablet Take 1 tablet (500 mg total) by mouth daily. Qty: 3 tablet, Refills: 0    loratadine (CLARITIN) 10 MG tablet Take 1 tablet (10 mg total) by mouth daily. Qty: 30 tablet, Refills: 0      CONTINUE these medications which have NOT CHANGED   Details  folic acid (FOLVITE) 1 MG tablet Take 1 mg by mouth daily.    FOSRENOL 1000 MG chewable tablet Chew 1,000 mg by mouth 4 (four) times daily. 500mg  with snacks    gabapentin (NEURONTIN) 300 MG capsule Take 300 mg by mouth 2 (two) times daily.    TOPROL XL 25 MG 24 hr tablet Take 25 mg by mouth daily.      STOP taking these medications     baclofen (LIORESAL) 10 MG tablet  DULoxetine (CYMBALTA) 30 MG capsule        Allergies  Allergen Reactions  . Allegra [Fexofenadine] Other (See Comments)    hallucinations  . Diphenhydramine Other (See Comments)    mental status change  . Eggs Or Egg-Derived Products Nausea And Vomiting   Follow-up Information    Follow up with [facility], MD In 1 week.   Specialty:  Internal Medicine   Contact information:   [address]  [place] [address] [phone]       Follow up with [facility], [place].   Specialty:  Nephrology   Why:  as previously scheduled   Contact  information:   [address] [place] [address] [phone]        The results of significant diagnostics from this hospitalization (including imaging, microbiology, ancillary and laboratory) are listed below for reference.    Significant Diagnostic Studies: Ct Head Wo Contrast  08/10/2015   CLINICAL DATA:  Generalized weakness, worsening over the last 3 days. Peritoneal dialysis yesterday. Confusion. Headache.  EXAM: CT HEAD WITHOUT CONTRAST  TECHNIQUE: Contiguous axial images were obtained from the base of the skull through the vertex without intravenous contrast.  COMPARISON:  None.  FINDINGS: The brainstem, cerebellum, cerebral peduncles, thalamus, basal ganglia, basilar cisterns, and ventricular system appear within normal limits. No intracranial hemorrhage, mass lesion, or acute CVA.  Opacification of multiple ethmoid air cells and partial opacification of others. Chronic right frontal sinusitis. Mild chronic left sphenoid sinusitis. Right mastoid effusion with fluid or some type of tissue medially in the right middle ear.  Arthropathy of the left temporomandibular joint.  IMPRESSION: 1. No acute intracranial findings. 2. Right mastoid effusion with fluid or soft tissue density medially in the right middle ear. 3. Chronic right frontal, left sphenoid, and bilateral ethmoid sinusitis. Some of the ethmoid air cells are completely opacified.   Electronically Signed   By: Gaylyn Rong M.D.   On: 08/10/2015 21:30   Dg Chest Port 1 View  08/10/2015   CLINICAL DATA:  49 year old male with fever and weakness. Dialysis patient. Initial encounter.  EXAM: PORTABLE CHEST - 1 VIEW  COMPARISON:  [facility] portable chest radiograph 10/26/2014 and earlier  FINDINGS: Portable AP upright view at 1928 hrs. Lower lung volumes and mild respiratory motion. Extent to a shin of cardiac size. Other mediastinal contours appear stable. Resolved basilar predominant interstitial opacity  compared to 2015. Allowing for portable technique, the lungs are clear.  IMPRESSION: Low lung volumes, otherwise no acute cardiopulmonary abnormality.   Electronically Signed   By: Odessa Fleming M.D.   On: 08/10/2015 19:48    Microbiology: Recent Results (from the past 240 hour(s))  Blood Culture (routine x 2)     Status: None   Collection Time: 08/10/15  7:25 PM  Result Value Ref Range Status   Specimen Description BLOOD RIGHT HAND  Final   Special Requests BOTTLES DRAWN AEROBIC AND ANAEROBIC 4CC EACH  Final   Culture NO GROWTH 5 DAYS  Final   Report Status 08/15/2015 FINAL  Final  Blood Culture (routine x 2)     Status: None   Collection Time: 08/10/15  7:28 PM  Result Value Ref Range Status   Specimen Description BLOOD LEFT HAND  Final   Special Requests BOTTLES DRAWN AEROBIC AND ANAEROBIC 4CC EACH  Final   Culture NO GROWTH 5 DAYS  Final   Report Status 08/15/2015 FINAL  Final  MRSA PCR Screening     Status:  None   Collection Time: 08/11/15  1:12 AM  Result Value Ref Range Status   MRSA by PCR NEGATIVE NEGATIVE Final    Comment:        The GeneXpert MRSA Assay (FDA approved for NASAL specimens only), is one component of a comprehensive MRSA colonization surveillance program. It is not intended to diagnose MRSA infection nor to guide or monitor treatment for MRSA infections.   Culture, body fluid-bottle     Status: None (Preliminary result)   Collection Time: 08/11/15 10:16 AM  Result Value Ref Range Status   Specimen Description FLUID PERITONEAL  Final   Special Requests NONE  Final   Culture NO GROWTH 3 DAYS  Final   Report Status PENDING  Incomplete  Gram stain     Status: None   Collection Time: 08/11/15 10:16 AM  Result Value Ref Range Status   Specimen Description FLUID PERITONEAL  Final   Special Requests NONE  Final   Gram Stain   Final    DEGENERATED CELLULAR MATERIAL PRESENT NO ORGANISMS SEEN    Report Status 08/11/2015 FINAL  Final     Labs: Basic  Metabolic Panel:  Recent Labs Lab 08/11/15 0141 08/11/15 0323 08/12/15 0559 08/13/15 1155 08/14/15 0600  NA 134* 134* 132* 133* 136  K 4.0 4.0 4.0 3.6 3.1*  CL 94* 92* 91* 89* 91*  CO2 21* 20* GLUCOSE 96 96 106* 117* 104*  BUN 77* 79* 89* 66* 55*  CREATININE 24.84* 25.24* 25.51* 20.76* 19.08*  CALCIUM 9.2 9.3 9.2 9.5 9.6  PHOS  --   --  11.3* 9.5*  --    Liver Function Tests:  Recent Labs Lab 08/10/15 2249 08/11/15 0141 08/11/15 0323 08/12/15 0559 08/13/15 1155  AST 9* 11* 9*  --   --   ALT 14* 13* 14*  --   --   ALKPHOS 182* 163* 169*  --   --   BILITOT 1.1 0.8 0.9  --   --   PROT 6.9 6.2* 6.4*  --   --   ALBUMIN 3.1* 2.8* 2.7* 2.8* 3.0*   No results for input(s): LIPASE, AMYLASE in the last 168 hours. No results for input(s): AMMONIA in the last 168 hours. CBC:  Recent Labs Lab 08/10/15 1927 08/11/15 0141 08/11/15 0323 08/12/15 0559 08/14/15 0600  WBC 18.5* 14.4* 14.8* 12.4* 12.5*  HGB 11.4* 9.7* 9.4* 9.5* 11.0*  HCT 35.3* 31.2* 30.2* 29.8* 34.9*  MCV 85.7 85.7 85.6 84.9 86.0  PLT 364 318 319 301 388   Cardiac Enzymes:  Recent Labs Lab 08/11/15 0141  TROPONINI 0.17*   BNP: BNP (last 3 results) No results for input(s): BNP in the last 8760 hours.  ProBNP (last 3 results) No results for input(s): PROBNP in the last 8760 hours.  CBG:  Recent Labs Lab 08/10/15 1857 08/11/15 2021  GLUCAP 97 99       Signed:  Yukari Flax  [facility] 08/15/2015, 9:42 AM

## 2015-08-15 NOTE — Progress Notes (Signed)
Patient discharge teaching given, including activity, diet, follow-up appoints, and medications. Patient verbalized understanding of all discharge instructions. IV access was d/c'd. Vitals are stable. Skin is intact except as charted in most recent assessments. Pt to be escorted out by NT, to be driven home by family.  Amika Tassin, MBA, BS, RN 

## 2015-08-15 NOTE — Progress Notes (Signed)
Subjective:  Did regular CCPD overnight- apparently for discharge today - considering changing from PD to hemo  Objective Vital signs in last 24 hours: Filed Vitals:   08/14/15 1653 08/14/15 2027 08/15/15 0501 08/15/15 0816  BP: 122/78 131/75 111/74 113/56  Pulse: 103 101 104 102  Temp: 99.1 F (37.3 C) 99 F (37.2 C) 98.6 F (37 C) 99 F (37.2 C)  TempSrc: Oral Oral Oral Oral  Resp: 18 17 18 17   Height:      Weight:      SpO2: 100% 100% 94% 95%   Weight change:   Intake/Output Summary (Last 24 hours) at 08/15/15 1213 Last data filed at 08/15/15 1000  Gross per 24 hour  Intake    840 ml  Output      1 ml  Net    839 ml    Assessment/Plan: 49 year old black male with reported ESRD on peritoneal dialysis for 3 years. He is hospitalized for decreased mental status  1 decreased mental status - possibly secondary to uremia/medication effect with Neurontin/Lyrica/baclofen. Those medications are currently on hold. Has resolved 2 ESRD: On peritoneal dialysis. He does not have peritonitis. His blood pressure may have been too low as Hays result of aggressive UF- has resolved 3 Hypertension: On low dose Toprol only. I will use 1.5% PD fluid only , liberalize diet - no fluid restriction for now 4. Anemia of ESRD: Just slightly under goal at this time. Iron low - will replete and give the ESA  5. Metabolic Bone Disease: We'll continue his outpatient Fosrenol dosing 6. ID- elevated WBC- unknown source- cultures pending- on augmentin- is better 7. Dispo- possible discharge soon, today - ?  To follow up with his regular renal physician   Robert Hays    Labs: Basic Metabolic Panel:  Recent Labs Lab 08/12/15 0559 08/13/15 1155 08/14/15 0600  NA 132* 133* 136  K 4.0 3.6 3.1*  CL 91* 89* 91*  CO2 22 24 25   GLUCOSE 106* 117* 104*  BUN 89* 66* 55*  CREATININE 25.51* 20.76* 19.08*  CALCIUM 9.2 9.5 9.6  PHOS 11.3* 9.5*  --    Liver Function Tests:  Recent Labs Lab  08/10/15 2249 08/11/15 0141 08/11/15 0323 08/12/15 0559 08/13/15 1155  AST 9* 11* 9*  --   --   ALT 14* 13* 14*  --   --   ALKPHOS 182* 163* 169*  --   --   BILITOT 1.1 0.8 0.9  --   --   PROT 6.9 6.2* 6.4*  --   --   ALBUMIN 3.1* 2.8* 2.7* 2.8* 3.0*   No results for input(s): LIPASE, AMYLASE in the last 168 hours. No results for input(s): AMMONIA in the last 168 hours. CBC:  Recent Labs Lab 08/10/15 1927 08/11/15 0141 08/11/15 0323 08/12/15 0559 08/14/15 0600  WBC 18.5* 14.4* 14.8* 12.4* 12.5*  HGB 11.4* 9.7* 9.4* 9.5* 11.0*  HCT 35.3* 31.2* 30.2* 29.8* 34.9*  MCV 85.7 85.7 85.6 84.9 86.0  PLT 364 318 319 301 388   Cardiac Enzymes:  Recent Labs Lab 08/11/15 0141  TROPONINI 0.17*   CBG:  Recent Labs Lab 08/10/15 1857 08/11/15 2021  GLUCAP 97 99    Iron Studies:  No results for input(s): IRON, TIBC, TRANSFERRIN, FERRITIN in the last 72 hours. Studies/Results: No results found. Medications: Infusions:    Scheduled Medications: . amoxicillin-clavulanate  1 tablet Oral Daily  . antiseptic oral rinse  7 mL Mouth Rinse BID  . darbepoetin (  ARANESP) injection - DIALYSIS  60 mcg Subcutaneous Q Tue-1800  . dialysis solution 1.5% low-MG/low-CA   Intraperitoneal Q24H  . gentamicin cream  1 application Topical Daily  . heparin  5,000 Units Subcutaneous 3 times per day  . lanthanum  1,000 mg Oral TID WC  . loratadine  10 mg Oral Daily  . meclizine  25 mg Oral Once  . metoprolol succinate  25 mg Oral Daily  . sodium chloride  3 mL Intravenous Q12H    have reviewed scheduled and prn medications.  Physical Exam: General: alert  Heart: RRR Lungs: poor effort Abdomen: obese, soft Extremities: no peripheral edema Dialysis Access: PD catheter    08/15/2015,12:13 PM  LOS: 5 days

## 2015-08-15 NOTE — Progress Notes (Signed)
Physical Therapy Treatment Patient Details Name: Robert Hays MRN: [id number] DOB: Mar 31, 1966 Today's Date: 08/15/2015    History of Present Illness 49 yo male peritoneal dialysis brought in by his son and daughter for concerns of confusion. Pt lives alone, but children report they dont think he really does his peritoneal dialysis at night like he is suppose too. He has been confused over the last several days. He has not mentioned any problems, no n/v/d. Pt denies anything right now but is delirious so history unreliable. Referred for admission for encephalopathy with fever/ams.     PT Comments    Patient making good gains with mobility and gait.  RW assists patient with balance and safety.  Patient able to negotiate 3 stairs with min assist today.  Follow Up Recommendations  Home health PT;Supervision for mobility/OOB     Equipment Recommendations  Rolling walker with 5" wheels    Recommendations for Other Services OT consult     Precautions / Restrictions Precautions Precautions: Fall Restrictions Weight Bearing Restrictions: No    Mobility  Bed Mobility               General bed mobility comments: Patient sitting EOB as PT entered room  Transfers Overall transfer level: Needs assistance Equipment used: Rolling walker (2 wheeled);None Transfers: Sit to/from [employer] to Stand: Min assist Stand pivot transfers: Min assist       General transfer comment: Verbal cues to pivot bed to w/c.  Patient requiring UE support and min assist.  Maintained flexed posture during transfer.  Patient able to move to standing from w/c with min assist.  Verbal cues for safe use of RW for support.  Ambulation/Gait Ambulation/Gait assistance: Min assist Ambulation Distance (Feet): 80 Feet Assistive device: Rolling walker (2 wheeled) Gait Pattern/deviations: Step-through pattern;Decreased step length - right;Decreased step length - left;Decreased  stride length;Shuffle Gait velocity: decreased Gait velocity interpretation: Below normal speed for age/gender General Gait Details: Verbal cues for safe use of RW.  Patient able to ambulate with shuffling steps with RW 80' with min assist.  Patient with slow gait speed.   Stairs Stairs: Yes Stairs assistance: Min assist Stair Management: One rail Right;Step to pattern;Sideways Number of Stairs: 3 General stair comments: Verbal and visual cues for step-to technique using 1 rail sideways.  Patient able to complete with min assist and increased time.  Instructed patient on how to have wife/son assist him safely on stairs.  Wheelchair Mobility    Modified Rankin (Stroke Patients Only)       Balance                                    Cognition Arousal/Alertness: Awake/alert Behavior During Therapy: WFL for tasks assessed/performed Overall Cognitive Status: Within Functional Limits for tasks assessed                 General Comments: Much improved cognition    Exercises      General Comments        Pertinent Vitals/Pain Pain Assessment: No/denies pain    Home Living                      Prior Function            PT Goals (current goals can now be found in the care plan section) Progress towards PT goals: Progressing toward goals  Frequency  Min 3X/week    PT Plan Current plan remains appropriate;Equipment recommendations need to be updated    Co-evaluation             End of Session Equipment Utilized During Treatment: Gait belt Activity Tolerance: Patient tolerated treatment well Patient left: in chair;with call bell/phone within reach     Time: 1333-1357 PT Time Calculation (min) (ACUTE ONLY): 24 min  Charges:  $Gait Training: 23-37 mins                    G Codes:      Vena Austria 2015-09-02, 3:33 PM Durenda Hurt. Renaldo Fiddler, [facility] Acute Rehab Services Pager [phone]

## 2015-08-16 LAB — CULTURE, BODY FLUID-BOTTLE: CULTURE: NO GROWTH

## 2015-08-16 LAB — CULTURE, BODY FLUID W GRAM STAIN -BOTTLE

## 2015-08-18 ENCOUNTER — Other Ambulatory Visit (HOSPITAL_COMMUNITY): Payer: Self-pay | Admitting: Respiratory Therapy

## 2015-08-20 ENCOUNTER — Other Ambulatory Visit: Payer: Self-pay | Admitting: Radiology

## 2015-08-20 ENCOUNTER — Other Ambulatory Visit (HOSPITAL_COMMUNITY): Payer: Self-pay | Admitting: Nephrology

## 2015-08-20 DIAGNOSIS — Z23 Encounter for immunization: Secondary | ICD-10-CM | POA: Diagnosis not present

## 2015-08-20 DIAGNOSIS — D631 Anemia in chronic kidney disease: Secondary | ICD-10-CM | POA: Diagnosis not present

## 2015-08-20 DIAGNOSIS — E669 Obesity, unspecified: Secondary | ICD-10-CM | POA: Diagnosis not present

## 2015-08-20 DIAGNOSIS — N186 End stage renal disease: Secondary | ICD-10-CM | POA: Diagnosis not present

## 2015-08-20 DIAGNOSIS — I120 Hypertensive chronic kidney disease with stage 5 chronic kidney disease or end stage renal disease: Secondary | ICD-10-CM | POA: Diagnosis not present

## 2015-08-20 DIAGNOSIS — Z992 Dependence on renal dialysis: Secondary | ICD-10-CM | POA: Diagnosis not present

## 2015-08-20 DIAGNOSIS — N2581 Secondary hyperparathyroidism of renal origin: Secondary | ICD-10-CM | POA: Diagnosis not present

## 2015-08-20 DIAGNOSIS — F329 Major depressive disorder, single episode, unspecified: Secondary | ICD-10-CM | POA: Diagnosis not present

## 2015-08-20 DIAGNOSIS — Z87891 Personal history of nicotine dependence: Secondary | ICD-10-CM | POA: Diagnosis not present

## 2015-08-20 DIAGNOSIS — D509 Iron deficiency anemia, unspecified: Secondary | ICD-10-CM | POA: Diagnosis not present

## 2015-08-20 DIAGNOSIS — G609 Hereditary and idiopathic neuropathy, unspecified: Secondary | ICD-10-CM | POA: Diagnosis not present

## 2015-08-21 ENCOUNTER — Ambulatory Visit (HOSPITAL_COMMUNITY)
Admission: RE | Admit: 2015-08-21 | Discharge: 2015-08-21 | Disposition: A | Discharge status: Home / Self Care | Payer: Medicare Other | Source: Ambulatory Visit | Attending: Nephrology | Admitting: Nephrology

## 2015-08-21 ENCOUNTER — Encounter (HOSPITAL_COMMUNITY): Payer: Self-pay

## 2015-08-21 ENCOUNTER — Other Ambulatory Visit (HOSPITAL_COMMUNITY): Payer: Medicare Other

## 2015-08-21 DIAGNOSIS — N186 End stage renal disease: Secondary | ICD-10-CM | POA: Insufficient documentation

## 2015-08-21 DIAGNOSIS — Z9119 Patient's noncompliance with other medical treatment and regimen: Secondary | ICD-10-CM | POA: Diagnosis not present

## 2015-08-21 DIAGNOSIS — G629 Polyneuropathy, unspecified: Secondary | ICD-10-CM | POA: Diagnosis not present

## 2015-08-21 DIAGNOSIS — N2581 Secondary hyperparathyroidism of renal origin: Secondary | ICD-10-CM | POA: Diagnosis not present

## 2015-08-21 DIAGNOSIS — Z992 Dependence on renal dialysis: Secondary | ICD-10-CM | POA: Diagnosis not present

## 2015-08-21 DIAGNOSIS — I120 Hypertensive chronic kidney disease with stage 5 chronic kidney disease or end stage renal disease: Secondary | ICD-10-CM | POA: Diagnosis not present

## 2015-08-21 DIAGNOSIS — D631 Anemia in chronic kidney disease: Secondary | ICD-10-CM | POA: Diagnosis not present

## 2015-08-21 DIAGNOSIS — Z79899 Other long term (current) drug therapy: Secondary | ICD-10-CM | POA: Diagnosis not present

## 2015-08-21 DIAGNOSIS — Z5320 Procedure and treatment not carried out because of patient's decision for unspecified reasons: Secondary | ICD-10-CM | POA: Diagnosis not present

## 2015-08-21 DIAGNOSIS — D509 Iron deficiency anemia, unspecified: Secondary | ICD-10-CM | POA: Diagnosis not present

## 2015-08-21 DIAGNOSIS — Z23 Encounter for immunization: Secondary | ICD-10-CM | POA: Diagnosis not present

## 2015-08-21 LAB — CBC
HEMATOCRIT: 36.7 % — AB (ref 39.0–52.0)
Hemoglobin: 11.2 g/dL — ABNORMAL LOW (ref 13.0–17.0)
MCH: 27.3 pg (ref 26.0–34.0)
MCHC: 30.5 g/dL (ref 30.0–36.0)
MCV: 89.5 fL (ref 78.0–100.0)
PLATELETS: 425 10*3/uL — AB (ref 150–400)
RBC: 4.1 MIL/uL — AB (ref 4.22–5.81)
RDW: 15.7 % — ABNORMAL HIGH (ref 11.5–15.5)
WBC: 9.4 10*3/uL (ref 4.0–10.5)

## 2015-08-21 LAB — BASIC METABOLIC PANEL
Anion gap: 16 — ABNORMAL HIGH (ref 5–15)
BUN: 56 mg/dL — ABNORMAL HIGH (ref 6–20)
CHLORIDE: 94 mmol/L — AB (ref 101–111)
CO2: 25 mmol/L (ref 22–32)
CREATININE: 19.72 mg/dL — AB (ref 0.61–1.24)
Calcium: 9.9 mg/dL (ref 8.9–10.3)
GFR calc non Af Amer: 2 mL/min — ABNORMAL LOW (ref >60)
GFR, EST AFRICAN AMERICAN: 3 mL/min — AB (ref >60)
Glucose, Bld: 100 mg/dL — ABNORMAL HIGH (ref 65–99)
POTASSIUM: 3.2 mmol/L — AB (ref 3.5–5.1)
Sodium: 135 mmol/L (ref 135–145)

## 2015-08-21 LAB — PROTIME-INR
INR: 1.25 (ref 0.00–1.49)
Prothrombin Time: 15.8 seconds — ABNORMAL HIGH (ref 11.6–15.2)

## 2015-08-21 LAB — APTT: aPTT: 35 seconds (ref 24–37)

## 2015-08-21 MED ORDER — SODIUM CHLORIDE 0.9 % IV SOLN: INTRAVENOUS | Status: DC

## 2015-08-21 MED ORDER — CEFAZOLIN SODIUM-DEXTROSE 2-3 GM-% IV SOLR
2.0000 g | INTRAVENOUS | Status: DC
  Filled 2015-08-21: qty 50

## 2015-08-21 NOTE — H&P (Signed)
Chief Complaint: Patient was seen in consultation today for transition from peritoneal dialysis to hemodialysis at the request of Befekadu,Belayenh  Referring Physician(s): Befekadu,Belayenh  History of Present Illness: Robert Hays is a 49 y.o. male   ESRD; on PD x 3 yrs Did well x 2 yrs or more. Has become somewhat unreliable with at home peritoneal dialysis Lives alone; has suffered falls recently Admitted to [facility] from ED 08/10/2015 with sepsis/confusion Treated and released 9/9- Had been seen by Nephrology Request has been made to transition from peritoneal dialysis to hemodialysis Scheduled for tunneled HD catheter placement Plan to continue to see Dr Kristian Covey in [place], [place]   Past Medical History  Diagnosis Date  . Renal disorder   . Hypertension   . Peritoneal dialysis status   . Depression   . Neuropathy     History reviewed. No pertinent past surgical history.  Allergies: Allegra; Diphenhydramine; and Eggs or egg-derived products  Medications: Prior to Admission medications   Medication Sig Start Date End Date Taking? Authorizing Provider  amoxicillin-clavulanate (AUGMENTIN) 500-125 MG per tablet Take 1 tablet (500 mg total) by mouth daily. 08/15/15   Joseph Art, DO  folic acid (FOLVITE) 1 MG tablet Take 1 mg by mouth daily. 05/26/15   Historical Provider, MD  FOSRENOL 1000 MG chewable tablet Chew 1,000 mg by mouth 4 (four) times daily. 500mg  with snacks 07/20/15   Historical Provider, MD  gabapentin (NEURONTIN) 300 MG capsule Take 300 mg by mouth 2 (two) times daily. 06/03/15   Historical Provider, MD  loratadine (CLARITIN) 10 MG tablet Take 1 tablet (10 mg total) by mouth daily. 08/15/15   Joseph Art, DO  TOPROL XL 25 MG 24 hr tablet Take 25 mg by mouth daily. 05/26/15   Historical Provider, MD     History reviewed. No pertinent family history.  Social History   Social History  . Marital Status: Legally Separated    Spouse Name: N/A  . Number of  Children: N/A  . Years of Education: N/A   Social History Main Topics  . Smoking status: Never Smoker   . Smokeless tobacco: None  . Alcohol Use: No  . Drug Use: No  . Sexual Activity: Not Asked   Other Topics Concern  . None   Social History Narrative    Review of Systems: A 12 point ROS discussed and pertinent positives are indicated in the HPI above.  All other systems are negative.  Review of Systems  Constitutional: Positive for activity change and fatigue. Negative for fever.  Respiratory: Negative for cough, choking and shortness of breath.   Cardiovascular: Negative for chest pain.  Gastrointestinal: Negative for nausea.  Neurological: Positive for weakness.  Psychiatric/Behavioral: Negative for behavioral problems and confusion.    Vital Signs: BP 108/85 mmHg  Pulse 92  Temp(Src) 98.3 F (36.8 C) (Oral)  Resp 18  Ht 5\' 8"  (1.727 m)  Wt 220 lb (99.791 kg)  BMI 33.46 kg/m2  SpO2 100%  Physical Exam  Constitutional: He is oriented to person, place, and time.  Cardiovascular: Normal rate, regular rhythm and normal heart sounds.   No murmur heard. Pulmonary/Chest: Effort normal and breath sounds normal.  Abdominal: Soft. There is no tenderness.  Musculoskeletal: Normal range of motion.  Weak Uses w/c and walker  Neurological: He is alert and oriented to person, place, and time.  Skin: Skin is warm.  Psychiatric: He has a normal mood and affect. His behavior is normal. Judgment and thought  content normal.  Nursing note and vitals reviewed.   Mallampati Score:  MD Evaluation Airway: WNL Heart: WNL Abdomen: WNL Chest/ Lungs: WNL ASA  Classification: 3 Mallampati/Airway Score: One  Imaging: Ct Head Wo Contrast  08/10/2015   CLINICAL DATA:  Generalized weakness, worsening over the last 3 days. Peritoneal dialysis yesterday. Confusion. Headache.  EXAM: CT HEAD WITHOUT CONTRAST  TECHNIQUE: Contiguous axial images were obtained from the base of the skull  through the vertex without intravenous contrast.  COMPARISON:  None.  FINDINGS: The brainstem, cerebellum, cerebral peduncles, thalamus, basal ganglia, basilar cisterns, and ventricular system appear within normal limits. No intracranial hemorrhage, mass lesion, or acute CVA.  Opacification of multiple ethmoid air cells and partial opacification of others. Chronic right frontal sinusitis. Mild chronic left sphenoid sinusitis. Right mastoid effusion with fluid or some type of tissue medially in the right middle ear.  Arthropathy of the left temporomandibular joint.  IMPRESSION: 1. No acute intracranial findings. 2. Right mastoid effusion with fluid or soft tissue density medially in the right middle ear. 3. Chronic right frontal, left sphenoid, and bilateral ethmoid sinusitis. Some of the ethmoid air cells are completely opacified.   Electronically Signed   By: Gaylyn Rong M.D.   On: 08/10/2015 21:30   Dg Chest Port 1 View  08/10/2015   CLINICAL DATA:  49 year old male with fever and weakness. Dialysis patient. Initial encounter.  EXAM: PORTABLE CHEST - 1 VIEW  COMPARISON:  [facility] portable chest radiograph 10/26/2014 and earlier  FINDINGS: Portable AP upright view at 1928 hrs. Lower lung volumes and mild respiratory motion. Extent to a shin of cardiac size. Other mediastinal contours appear stable. Resolved basilar predominant interstitial opacity compared to 2015. Allowing for portable technique, the lungs are clear.  IMPRESSION: Low lung volumes, otherwise no acute cardiopulmonary abnormality.   Electronically Signed   By: Odessa Fleming M.D.   On: 08/10/2015 19:48    Labs:  CBC:  Recent Labs  08/11/15 0323 08/12/15 0559 08/14/15 0600 08/21/15 1201  WBC 14.8* 12.4* 12.5* 9.4  HGB 9.4* 9.5* 11.0* 11.2*  HCT 30.2* 29.8* 34.9* 36.7*  PLT 319 301 388 425*    COAGS:  Recent Labs  08/11/15 0141 08/21/15 1201  INR 1.47 1.25  APTT 36 35    BMP:  Recent Labs   08/12/15 0559 08/13/15 1155 08/14/15 0600 08/21/15 1201  NA 132* 133* 136 135  K 4.0 3.6 3.1* 3.2*  CL 91* 89* 91* 94*  CO2 22 24 25 25   GLUCOSE 106* 117* 104* 100*  BUN 89* 66* 55* 56*  CALCIUM 9.2 9.5 9.6 9.9  CREATININE 25.51* 20.76* 19.08* 19.72*  GFRNONAA 2* 2* 2* 2*  GFRAA 2* 3* 3* 3*    LIVER FUNCTION TESTS:  Recent Labs  08/10/15 2249 08/11/15 0141 08/11/15 0323 08/12/15 0559 08/13/15 1155  BILITOT 1.1 0.8 0.9  --   --   AST 9* 11* 9*  --   --   ALT 14* 13* 14*  --   --   ALKPHOS 182* 163* 169*  --   --   PROT 6.9 6.2* 6.4*  --   --   ALBUMIN 3.1* 2.8* 2.7* 2.8* 3.0*    TUMOR MARKERS: No results for input(s): AFPTM, CEA, CA199, CHROMGRNA in the last 8760 hours.  Assessment and Plan:  ESRD Peritoneal home dialysis x 3 yrs Has become unreliable and noncompliant  Recently hospitalized 9/4-9/9 : sepsis/confusion Falls Now scheduled for transition to hemodialysis Scheduled today  for tunneled dialysis catheter placement Risks and Benefits discussed with the patient including, but not limited to bleeding, infection, vascular injury, pneumothorax which may require chest tube placement, air embolism or even death All of the patient's questions were answered, patient is agreeable to proceed. Consent signed and in chart.   Thank you for this interesting consult.  I greatly enjoyed meeting Robert Hays and look forward to participating in their care.  A copy of this report was sent to the requesting provider on this date.  Signed: Valentine Kuechle A 08/21/2015, 1:06 PM   I spent a total of  30 Minutes   in face to face in clinical consultation, greater than 50% of which was counseling/coordinating care for HD catheter

## 2015-08-22 ENCOUNTER — Other Ambulatory Visit (HOSPITAL_COMMUNITY): Payer: Self-pay | Admitting: Nephrology

## 2015-08-22 ENCOUNTER — Other Ambulatory Visit: Payer: Self-pay | Admitting: Radiology

## 2015-08-22 DIAGNOSIS — N186 End stage renal disease: Secondary | ICD-10-CM

## 2015-08-22 DIAGNOSIS — E669 Obesity, unspecified: Secondary | ICD-10-CM | POA: Diagnosis not present

## 2015-08-22 DIAGNOSIS — G609 Hereditary and idiopathic neuropathy, unspecified: Secondary | ICD-10-CM | POA: Diagnosis not present

## 2015-08-22 DIAGNOSIS — Z992 Dependence on renal dialysis: Secondary | ICD-10-CM | POA: Diagnosis not present

## 2015-08-22 DIAGNOSIS — F329 Major depressive disorder, single episode, unspecified: Secondary | ICD-10-CM | POA: Diagnosis not present

## 2015-08-22 DIAGNOSIS — I120 Hypertensive chronic kidney disease with stage 5 chronic kidney disease or end stage renal disease: Secondary | ICD-10-CM | POA: Diagnosis not present

## 2015-08-25 ENCOUNTER — Encounter (HOSPITAL_COMMUNITY): Payer: Self-pay

## 2015-08-25 ENCOUNTER — Other Ambulatory Visit (HOSPITAL_COMMUNITY): Payer: Self-pay | Admitting: Nephrology

## 2015-08-25 ENCOUNTER — Ambulatory Visit (HOSPITAL_COMMUNITY)
Admission: RE | Admit: 2015-08-25 | Discharge: 2015-08-25 | Disposition: A | Discharge status: Home / Self Care | Payer: Medicare Other | Source: Ambulatory Visit | Attending: Nephrology | Admitting: Nephrology

## 2015-08-25 DIAGNOSIS — N186 End stage renal disease: Secondary | ICD-10-CM

## 2015-08-25 DIAGNOSIS — Z452 Encounter for adjustment and management of vascular access device: Secondary | ICD-10-CM | POA: Diagnosis not present

## 2015-08-25 DIAGNOSIS — Z992 Dependence on renal dialysis: Principal | ICD-10-CM

## 2015-08-25 MED ORDER — HEPARIN SODIUM (PORCINE) 1000 UNIT/ML IJ SOLN
INTRAMUSCULAR | Status: AC
  Filled 2015-08-25: qty 1

## 2015-08-25 MED ORDER — FENTANYL CITRATE (PF) 100 MCG/2ML IJ SOLN
INTRAMUSCULAR | Status: AC | PRN
  Administered 2015-08-25: 50 ug via INTRAVENOUS

## 2015-08-25 MED ORDER — MIDAZOLAM HCL 2 MG/2ML IJ SOLN
INTRAMUSCULAR | Status: AC | PRN
  Administered 2015-08-25: 1 mg via INTRAVENOUS

## 2015-08-25 MED ORDER — CEFAZOLIN SODIUM-DEXTROSE 2-3 GM-% IV SOLR
INTRAVENOUS | Status: AC
  Filled 2015-08-25: qty 50

## 2015-08-25 MED ORDER — LIDOCAINE HCL 1 % IJ SOLN
INTRAMUSCULAR | Status: DC
Start: 2015-08-25 — End: 2015-08-26
  Filled 2015-08-25: qty 20

## 2015-08-25 MED ORDER — SODIUM CHLORIDE 0.9 % IV SOLN: INTRAVENOUS | Status: DC

## 2015-08-25 MED ORDER — MIDAZOLAM HCL 2 MG/2ML IJ SOLN
INTRAMUSCULAR | Status: AC
  Filled 2015-08-25: qty 2

## 2015-08-25 MED ORDER — CEFAZOLIN SODIUM-DEXTROSE 2-3 GM-% IV SOLR
2.0000 g | INTRAVENOUS | Status: AC
  Administered 2015-08-25: 2 g via INTRAVENOUS

## 2015-08-25 MED ORDER — FENTANYL CITRATE (PF) 100 MCG/2ML IJ SOLN
INTRAMUSCULAR | Status: AC
  Filled 2015-08-25: qty 2

## 2015-08-25 MED ORDER — CEFAZOLIN SODIUM-DEXTROSE 2-3 GM-% IV SOLR
INTRAVENOUS | Status: AC
  Administered 2015-08-25: 2 g via INTRAVENOUS
  Filled 2015-08-25: qty 50

## 2015-08-25 NOTE — Progress Notes (Signed)
Pt states he is going to need something for pain for home use because it is starting to hurt and over the counter will not be strong enough.  PA Brayton El notified and Rx received and given to pt.

## 2015-08-25 NOTE — Procedures (Signed)
Interventional Radiology Procedure Note  Procedure: Placement of a right IJ approach HD catheter, Palindrome, 19cm tip to cuff.  Tip is positioned at the superior cavoatrial junction and catheter is ready for immediate use.  Complications: None Recommendations:  - Ok to shower tomorrow - Do not submerge for 7 days - Routine line care   Signed,  Yvone Neu. Loreta Ave, DO

## 2015-08-25 NOTE — H&P (Signed)
HPI: Patient with ESRD on peritoneal dialysis switching to HD. He is scheduled today for image guided tunneled hemodialysis catheter placement.  The patient has had a H&P performed within the last 30 days, all history, medications, and exam have been reviewed. The patient denies any interval changes since the H&P.  Medications: Prior to Admission medications   Medication Sig Start Date End Date Taking? Authorizing Provider  amoxicillin-clavulanate (AUGMENTIN) 500-125 MG per tablet Take 1 tablet (500 mg total) by mouth daily. 08/15/15   Joseph Art, DO  folic acid (FOLVITE) 1 MG tablet Take 1 mg by mouth daily. 05/26/15   Historical Provider, MD  FOSRENOL 1000 MG chewable tablet Chew 1,000 mg by mouth 4 (four) times daily.  with snacks 07/20/15   Historical Provider, MD  gabapentin (NEURONTIN) 300 MG capsule Take 300 mg by mouth 2 (two) times daily. 06/03/15   Historical Provider, MD  loratadine (CLARITIN) 10 MG tablet Take 1 tablet (10 mg total) by mouth daily. 08/15/15   Joseph Art, DO  TOPROL XL 25 MG 24 hr tablet Take 25 mg by mouth daily. 05/26/15   Historical Provider, MD    Vital Signs: BP 141/98 mmHg  Pulse 88  Temp(Src) 98.4 F (36.9 C) (Oral)  Resp 18  Ht  (1.727 m)  Wt 220 lb (99.791 kg)  BMI 33.46 kg/m2  SpO2 95%  Physical Exam  Constitutional: He is oriented to person, place, and time. No distress.  HENT:  Head: Normocephalic and atraumatic.  Cardiovascular: Normal rate and regular rhythm.  Exam reveals no gallop and no friction rub.   No murmur heard. Pulmonary/Chest: Effort normal and breath sounds normal. No respiratory distress. He has no wheezes. He has no rales.  Abdominal: Soft. Bowel sounds are normal. He exhibits no distension. There is no tenderness.  Neurological: He is alert and oriented to person, place, and time.  Skin: Skin is warm and dry. He is not diaphoretic.    Mallampati Score:  MD Evaluation Airway: WNL Heart: WNL Abdomen:  WNL Chest/ Lungs: WNL ASA  Classification: 3 Mallampati/Airway Score: Two  Labs:  CBC:  Recent Labs  08/11/15 0323 08/12/15 0559 08/14/15 0600 08/21/15 1201  WBC 14.8* 12.4* 12.5* 9.4  HGB 9.4* 9.5* 11.0* 11.2*  HCT 30.2* 29.8* 34.9* 36.7*  PLT 319 301 388 425*    COAGS:  Recent Labs  08/11/15 0141 08/21/15 1201  INR 1.47 1.25  APTT 36 35    BMP:  Recent Labs  08/12/15 0559 08/13/15 1155 08/14/15 0600 08/21/15 1201  NA 132* 133* 136 135  K 4.0 3.6 3.1* 3.2*  CL 91* 89* 91* 94*  CO2 GLUCOSE 106* 117* 104* 100*  BUN 89* 66* 55* 56*  CALCIUM 9.2 9.5 9.6 9.9  CREATININE 25.51* 20.76* 19.08* 19.72*  GFRNONAA 2* 2* 2* 2*  GFRAA 2* 3* 3* 3*    LIVER FUNCTION TESTS:  Recent Labs  08/10/15 2249 08/11/15 0141 08/11/15 0323 08/12/15 0559 08/13/15 1155  BILITOT 1.1 0.8 0.9  --   --   AST 9* 11* 9*  --   --   ALT 14* 13* 14*  --   --   ALKPHOS 182* 163* 169*  --   --   PROT 6.9 6.2* 6.4*  --   --   ALBUMIN 3.1* 2.8* 2.7* 2.8* 3.0*    Assessment/Plan:  ESRD on peritoneal dialysis switching to HD Scheduled today for image guided tunneled hemodialysis catheter  placement with sedation- previously 08/21/15 patient left AMA secondary to delay in procedure from technical issues. The patient has been NPO, no blood thinners taken, labs and vitals have been reviewed, last dialysis last evening  Risks and Benefits discussed with the patient including, but not limited to bleeding, infection, vascular injury, pneumothorax which may require chest tube placement, air embolism or even death All of the patient's questions were answered, patient is agreeable to proceed. Consent signed and in chart.    SignedBerneta Levins 08/25/2015, 12:44 PM

## 2015-08-25 NOTE — Sedation Documentation (Signed)
Patient brought to radiology nursing station due to no bed/nurse availability in short stay. Will continue to monitor.  Patient awake, alert, denies pain, vitals stable. Asher Muir [place],RN

## 2015-08-25 NOTE — Discharge Instructions (Signed)
Hemodialysis Catheter Insertion Procedure Note  Vascular Access for Hemodialysis A vascular access is a connection between two blood vessels that allows blood to be easily removed from the body and returned to the body during hemodialysis. Hemodialysis is a procedure in which a machine outside of the body filters the blood. There are three types of vascular accesses:   Arteriovenous fistula. This is a connection between an artery and a vein (usually in the arm) that is made by sewing them together. Blood in the artery flows directly into the vein, causing it to get larger over time. This makes it easier for the vein to be used for hemodialysis. An arteriovenous fistula takes 1-6 months to develop after surgery.   Arteriovenous graft. This is a connection between an artery and a vein in the arm that is made with a tube. An arteriovenous graft can be used within 2-3 weeks of surgery.   Venous catheter. This is a thin, flexible tube that is placed in a large vein (usually in the neck, chest, or groin). A venous catheter for hemodialysis contains two tubes that come out of the skin. A venous catheter can be used right away. It is usually used as a temporary access if you need hemodialysis before a fistula or graft has developed. It may also be used as a permanent access if a fistula or graft cannot be created. WHICH TYPE OF ACCESS IS BEST FOR ME? The type of access that is best for you depends on the size and strength of your veins.  A fistula is usually the preferred type of access. It can last several years and is less likely than the other types of accesses to become infected or to cause blood clots within a blood vessel (thrombosis). However, a fistula is not an option for everyone. If your veins are not the right size, a graft may be used instead. Grafts require you to have strong veins. If your veins are not strong enough for a graft, a catheter may be used. Catheters are more likely than fistulas  and grafts to become infected or to have thrombosis.  Sometimes, only one type of access is an option. Your health care provider will help you determine which type of access is best for you.  HOW IS A VASCULAR ACCESS USED? The way the access is used depends on the type of access:   If the access is a fistula or graft, two needles are inserted through the skin into the access before each hemodialysis session. Blood leaves the body through one of the needles and travels through a tube to the hemodialysis machine (dialyzer). It then flows through another tube and returns to the body through the second needle.   If the access is a catheter, one tube is connected directly to the tube that leads to the dialyzer and the other is connected to a tube that leads away from the dialyzer. Blood leaves the body through one tube and returns to the body through the other.  WHAT KIND OF PROBLEMS CAN OCCUR WITH VASCULAR ACCESSES?  Blood clots within a blood vessel (thrombosis). Thrombosis can lead to a narrowing of a blood vessel or tube (stenosis). If thrombosis occurs frequently, another access site may be created as a backup.   Infection.  These problems are most likely to occur with a venous catheter and least likely to occur with an arteriovenous fistula.  HOW DO I CARE FOR MY VASCULAR ACCESS? Wear a medical alert bracelet. This tells  health care providers that you are a dialysis patient in the case of an emergency and allows them to care for your veins appropriately. If you have a graft or fistula:   A "bruit" is a noise that is heard with a stethoscope and a "thrill" is a vibration felt over the graft or fistula. The presence of the bruit and thrill indicates that the access is working. You will be taught to feel for the thrill each day. If this is not felt, the access may be clotted. Call your health care provider.   You may use the arm where your vascular access is located freely after the site heals.  Keep the following in mind:   Avoid pressure on the arm.   Avoid lifting heavy objects with the arm.   Avoid sleeping on the arm.   Avoid wearing tight-sleeved shirts or jewelry around the graft or fistula.   Do not allow blood pressure monitoring or needle punctures on the side where the graft or fistula is located.   With permission from your health care provider, you may do exercises to help with blood flow through a fistula. These exercises involve squeezing a rubber ball or other soft objects as instructed. SEEK MEDICAL CARE IF:   Chills develop.   You have an oral temperature above 102 F (38.9 C).  Swelling around the graft or fistula gets worse.   New pain develops.   Pus or other fluid (drainage) is seen at the vascular access site.   Skin redness or red streaking is seen on the skin around, above, or below the vascular access. SEEK IMMEDIATE MEDICAL CARE IF:   Pain, numbness, or an unusual pale skin color develops in the hand on the side of your fistula.   Dizziness or weakness develops that you have not had before.   The vascular access has bleeding that cannot be easily controlled. Document Released: 02/12/2003 Document Revised: 04/08/2014 Document Reviewed: 04/10/2013 [employer] Patient Information 2015 [place], [place]. This information is not intended to replace advice given to you by your health care provider. Make sure you discuss any questions you have with your health care provider.

## 2015-08-26 DIAGNOSIS — N186 End stage renal disease: Secondary | ICD-10-CM | POA: Diagnosis not present

## 2015-08-26 DIAGNOSIS — N2581 Secondary hyperparathyroidism of renal origin: Secondary | ICD-10-CM | POA: Diagnosis not present

## 2015-08-26 DIAGNOSIS — D509 Iron deficiency anemia, unspecified: Secondary | ICD-10-CM | POA: Diagnosis not present

## 2015-08-26 DIAGNOSIS — D631 Anemia in chronic kidney disease: Secondary | ICD-10-CM | POA: Diagnosis not present

## 2015-08-26 DIAGNOSIS — Z992 Dependence on renal dialysis: Secondary | ICD-10-CM | POA: Diagnosis not present

## 2015-08-27 DIAGNOSIS — E669 Obesity, unspecified: Secondary | ICD-10-CM | POA: Diagnosis not present

## 2015-08-27 DIAGNOSIS — F329 Major depressive disorder, single episode, unspecified: Secondary | ICD-10-CM | POA: Diagnosis not present

## 2015-08-27 DIAGNOSIS — G609 Hereditary and idiopathic neuropathy, unspecified: Secondary | ICD-10-CM | POA: Diagnosis not present

## 2015-08-27 DIAGNOSIS — Z992 Dependence on renal dialysis: Secondary | ICD-10-CM | POA: Diagnosis not present

## 2015-08-27 DIAGNOSIS — I120 Hypertensive chronic kidney disease with stage 5 chronic kidney disease or end stage renal disease: Secondary | ICD-10-CM | POA: Diagnosis not present

## 2015-08-27 DIAGNOSIS — N186 End stage renal disease: Secondary | ICD-10-CM | POA: Diagnosis not present

## 2015-08-28 ENCOUNTER — Other Ambulatory Visit: Payer: Self-pay | Admitting: *Deleted

## 2015-08-28 DIAGNOSIS — N186 End stage renal disease: Secondary | ICD-10-CM

## 2015-08-28 DIAGNOSIS — N2581 Secondary hyperparathyroidism of renal origin: Secondary | ICD-10-CM | POA: Diagnosis not present

## 2015-08-28 DIAGNOSIS — Z01810 Encounter for preprocedural cardiovascular examination: Secondary | ICD-10-CM

## 2015-08-28 DIAGNOSIS — D509 Iron deficiency anemia, unspecified: Secondary | ICD-10-CM | POA: Diagnosis not present

## 2015-08-28 DIAGNOSIS — D631 Anemia in chronic kidney disease: Secondary | ICD-10-CM | POA: Diagnosis not present

## 2015-08-28 DIAGNOSIS — Z992 Dependence on renal dialysis: Secondary | ICD-10-CM | POA: Diagnosis not present

## 2015-08-30 DIAGNOSIS — N186 End stage renal disease: Secondary | ICD-10-CM | POA: Diagnosis not present

## 2015-08-30 DIAGNOSIS — D509 Iron deficiency anemia, unspecified: Secondary | ICD-10-CM | POA: Diagnosis not present

## 2015-08-30 DIAGNOSIS — N2581 Secondary hyperparathyroidism of renal origin: Secondary | ICD-10-CM | POA: Diagnosis not present

## 2015-08-30 DIAGNOSIS — D631 Anemia in chronic kidney disease: Secondary | ICD-10-CM | POA: Diagnosis not present

## 2015-08-30 DIAGNOSIS — Z992 Dependence on renal dialysis: Secondary | ICD-10-CM | POA: Diagnosis not present

## 2015-09-01 DIAGNOSIS — I10 Essential (primary) hypertension: Secondary | ICD-10-CM | POA: Diagnosis not present

## 2015-09-01 DIAGNOSIS — I2510 Atherosclerotic heart disease of native coronary artery without angina pectoris: Secondary | ICD-10-CM | POA: Diagnosis not present

## 2015-09-01 DIAGNOSIS — Y841 Kidney dialysis as the cause of abnormal reaction of the patient, or of later complication, without mention of misadventure at the time of the procedure: Secondary | ICD-10-CM | POA: Diagnosis not present

## 2015-09-01 DIAGNOSIS — N186 End stage renal disease: Secondary | ICD-10-CM | POA: Diagnosis not present

## 2015-09-02 DIAGNOSIS — N2581 Secondary hyperparathyroidism of renal origin: Secondary | ICD-10-CM | POA: Diagnosis not present

## 2015-09-02 DIAGNOSIS — N186 End stage renal disease: Secondary | ICD-10-CM | POA: Diagnosis not present

## 2015-09-02 DIAGNOSIS — D631 Anemia in chronic kidney disease: Secondary | ICD-10-CM | POA: Diagnosis not present

## 2015-09-02 DIAGNOSIS — D509 Iron deficiency anemia, unspecified: Secondary | ICD-10-CM | POA: Diagnosis not present

## 2015-09-02 DIAGNOSIS — Z992 Dependence on renal dialysis: Secondary | ICD-10-CM | POA: Diagnosis not present

## 2015-09-03 DIAGNOSIS — I120 Hypertensive chronic kidney disease with stage 5 chronic kidney disease or end stage renal disease: Secondary | ICD-10-CM | POA: Diagnosis not present

## 2015-09-03 DIAGNOSIS — E669 Obesity, unspecified: Secondary | ICD-10-CM | POA: Diagnosis not present

## 2015-09-03 DIAGNOSIS — G609 Hereditary and idiopathic neuropathy, unspecified: Secondary | ICD-10-CM | POA: Diagnosis not present

## 2015-09-03 DIAGNOSIS — N186 End stage renal disease: Secondary | ICD-10-CM | POA: Diagnosis not present

## 2015-09-03 DIAGNOSIS — F329 Major depressive disorder, single episode, unspecified: Secondary | ICD-10-CM | POA: Diagnosis not present

## 2015-09-03 DIAGNOSIS — Z992 Dependence on renal dialysis: Secondary | ICD-10-CM | POA: Diagnosis not present

## 2015-09-04 ENCOUNTER — Other Ambulatory Visit: Payer: Self-pay | Admitting: Vascular Surgery

## 2015-09-04 DIAGNOSIS — Z992 Dependence on renal dialysis: Secondary | ICD-10-CM | POA: Diagnosis not present

## 2015-09-04 DIAGNOSIS — D631 Anemia in chronic kidney disease: Secondary | ICD-10-CM | POA: Diagnosis not present

## 2015-09-04 DIAGNOSIS — N186 End stage renal disease: Secondary | ICD-10-CM | POA: Diagnosis not present

## 2015-09-04 DIAGNOSIS — N2581 Secondary hyperparathyroidism of renal origin: Secondary | ICD-10-CM | POA: Diagnosis not present

## 2015-09-04 DIAGNOSIS — D509 Iron deficiency anemia, unspecified: Secondary | ICD-10-CM | POA: Diagnosis not present

## 2015-09-05 ENCOUNTER — Ambulatory Visit
Admission: RE | Admit: 2015-09-05 | Discharge: 2015-09-05 | Disposition: A | Discharge status: Home / Self Care | Payer: Medicare Other | Source: Ambulatory Visit | Attending: Vascular Surgery | Admitting: Vascular Surgery

## 2015-09-05 ENCOUNTER — Encounter
Admission: RE | Admit: 2015-09-05 | Discharge: 2015-09-05 | Disposition: A | Discharge status: Home / Self Care | Payer: Medicare Other | Source: Ambulatory Visit | Attending: Vascular Surgery | Admitting: Vascular Surgery

## 2015-09-05 DIAGNOSIS — Z01818 Encounter for other preprocedural examination: Secondary | ICD-10-CM | POA: Insufficient documentation

## 2015-09-05 DIAGNOSIS — N186 End stage renal disease: Secondary | ICD-10-CM | POA: Diagnosis not present

## 2015-09-05 DIAGNOSIS — R0602 Shortness of breath: Secondary | ICD-10-CM | POA: Diagnosis not present

## 2015-09-05 DIAGNOSIS — Z992 Dependence on renal dialysis: Secondary | ICD-10-CM | POA: Diagnosis not present

## 2015-09-05 HISTORY — DX: Reserved for inherently not codable concepts without codable children: IMO0001

## 2015-09-05 HISTORY — DX: Cardiac arrhythmia, unspecified: I49.9

## 2015-09-05 HISTORY — DX: Sleep apnea, unspecified: G47.30

## 2015-09-05 HISTORY — DX: Anemia, unspecified: D64.9

## 2015-09-05 HISTORY — DX: Acute myocardial infarction, unspecified: I21.9

## 2015-09-05 HISTORY — DX: Peripheral vascular disease, unspecified: I73.9

## 2015-09-05 LAB — APTT: APTT: 33 s (ref 24–36)

## 2015-09-05 LAB — CBC WITH DIFFERENTIAL/PLATELET
BASOS ABS: 0 10*3/uL (ref 0–0.1)
BASOS PCT: 1 %
EOS PCT: 3 %
Eosinophils Absolute: 0.2 10*3/uL (ref 0–0.7)
HCT: 35.6 % — ABNORMAL LOW (ref 40.0–52.0)
Hemoglobin: 10.9 g/dL — ABNORMAL LOW (ref 13.0–18.0)
Lymphocytes Relative: 20 %
Lymphs Abs: 1.4 10*3/uL (ref 1.0–3.6)
MCH: 27.5 pg (ref 26.0–34.0)
MCHC: 30.6 g/dL — ABNORMAL LOW (ref 32.0–36.0)
MCV: 90 fL (ref 80.0–100.0)
MONO ABS: 0.6 10*3/uL (ref 0.2–1.0)
Monocytes Relative: 9 %
Neutro Abs: 4.4 10*3/uL (ref 1.4–6.5)
Neutrophils Relative %: 67 %
PLATELETS: 255 10*3/uL (ref 150–440)
RBC: 3.95 MIL/uL — AB (ref 4.40–5.90)
RDW: 17.6 % — AB (ref 11.5–14.5)
WBC: 6.7 10*3/uL (ref 3.8–10.6)

## 2015-09-05 LAB — PROTIME-INR
INR: 1.08
PROTHROMBIN TIME: 14.2 s (ref 11.4–15.0)

## 2015-09-05 LAB — BASIC METABOLIC PANEL
ANION GAP: 9 (ref 5–15)
BUN: 17 mg/dL (ref 6–20)
CALCIUM: 9.1 mg/dL (ref 8.9–10.3)
CO2: 27 mmol/L (ref 22–32)
Chloride: 104 mmol/L (ref 101–111)
Creatinine, Ser: 10.47 mg/dL — ABNORMAL HIGH (ref 0.61–1.24)
GFR calc Af Amer: 6 mL/min — ABNORMAL LOW (ref >60)
GFR, EST NON AFRICAN AMERICAN: 5 mL/min — AB (ref >60)
GLUCOSE: 84 mg/dL (ref 65–99)
Potassium: 4.2 mmol/L (ref 3.5–5.1)
SODIUM: 140 mmol/L (ref 135–145)

## 2015-09-05 LAB — SURGICAL PCR SCREEN
MRSA, PCR: NEGATIVE
Staphylococcus aureus: NEGATIVE

## 2015-09-05 LAB — TYPE AND SCREEN
ABO/RH(D): O POS
Antibody Screen: NEGATIVE

## 2015-09-05 LAB — ABO/RH: ABO/RH(D): O POS

## 2015-09-05 NOTE — Patient Instructions (Signed)
  Your procedure is scheduled on: 09/12/15 Report to Day Surgery. To find out your arrival time please call [phone] between 1PM - 3PM on 09/11/15.  Remember: Instructions that are not followed completely may result in serious medical risk, up to and including death, or upon the discretion of your surgeon and anesthesiologist your surgery may need to be rescheduled.    __X__ 1. Do not eat food or drink liquids after midnight. No gum chewing or hard candies.     _X___ 2. No Alcohol for 24 hours before or after surgery.   ____ 3. Bring all medications with you on the day of surgery if instructed.    __X__ 4. Notify your doctor if there is any change in your medical condition     (cold, fever, infections).     Do not wear jewelry, make-up, hairpins, clips or nail polish.  Do not wear lotions, powders, or perfumes. You may wear deodorant.  Do not shave 48 hours prior to surgery. Men may shave face and neck.  Do not bring valuables to the hospital.    [facility] is not responsible for any belongings or valuables.               Contacts, dentures or bridgework may not be worn into surgery.  Leave your suitcase in the car. After surgery it may be brought to your room.  For patients admitted to the hospital, discharge time is determined by your                treatment team.   Patients discharged the day of surgery will not be allowed to drive home.   Please read over the following fact sheets that you were given:   Surgical Site Infection Prevention   ____ Take these medicines the morning of surgery with A SIP OF WATER:    1. LEVOTHYROXINE  2. GABAPENTIN  3.METOPROLOL    4.AMLODIPINE  5.  6.  ____ Fleet Enema (as directed)   __X__ Use CHG Soap as directed  ____ Use inhalers on the day of surgery  ____ Stop metformin 2 days prior to surgery    ____ Take 1/2 of usual insulin dose the night before surgery and none on the morning of surgery.   ____ Stop  Coumadin/Plavix/aspirin on   ____ Stop Anti-inflammatories on   ____ Stop supplements until after surgery.    ____ Bring C-Pap to the hospital.

## 2015-09-06 DIAGNOSIS — N2581 Secondary hyperparathyroidism of renal origin: Secondary | ICD-10-CM | POA: Diagnosis not present

## 2015-09-06 DIAGNOSIS — D631 Anemia in chronic kidney disease: Secondary | ICD-10-CM | POA: Diagnosis not present

## 2015-09-06 DIAGNOSIS — Z992 Dependence on renal dialysis: Secondary | ICD-10-CM | POA: Diagnosis not present

## 2015-09-06 DIAGNOSIS — D509 Iron deficiency anemia, unspecified: Secondary | ICD-10-CM | POA: Diagnosis not present

## 2015-09-06 DIAGNOSIS — N186 End stage renal disease: Secondary | ICD-10-CM | POA: Diagnosis not present

## 2015-09-09 DIAGNOSIS — N186 End stage renal disease: Secondary | ICD-10-CM | POA: Diagnosis not present

## 2015-09-09 DIAGNOSIS — D631 Anemia in chronic kidney disease: Secondary | ICD-10-CM | POA: Diagnosis not present

## 2015-09-09 DIAGNOSIS — D509 Iron deficiency anemia, unspecified: Secondary | ICD-10-CM | POA: Diagnosis not present

## 2015-09-09 DIAGNOSIS — Z992 Dependence on renal dialysis: Secondary | ICD-10-CM | POA: Diagnosis not present

## 2015-09-09 DIAGNOSIS — N2581 Secondary hyperparathyroidism of renal origin: Secondary | ICD-10-CM | POA: Diagnosis not present

## 2015-09-10 DIAGNOSIS — N186 End stage renal disease: Secondary | ICD-10-CM | POA: Diagnosis not present

## 2015-09-10 DIAGNOSIS — E669 Obesity, unspecified: Secondary | ICD-10-CM | POA: Diagnosis not present

## 2015-09-10 DIAGNOSIS — I120 Hypertensive chronic kidney disease with stage 5 chronic kidney disease or end stage renal disease: Secondary | ICD-10-CM | POA: Diagnosis not present

## 2015-09-10 DIAGNOSIS — F329 Major depressive disorder, single episode, unspecified: Secondary | ICD-10-CM | POA: Diagnosis not present

## 2015-09-10 DIAGNOSIS — Z992 Dependence on renal dialysis: Secondary | ICD-10-CM | POA: Diagnosis not present

## 2015-09-10 DIAGNOSIS — G609 Hereditary and idiopathic neuropathy, unspecified: Secondary | ICD-10-CM | POA: Diagnosis not present

## 2015-09-11 DIAGNOSIS — N186 End stage renal disease: Secondary | ICD-10-CM | POA: Diagnosis not present

## 2015-09-11 DIAGNOSIS — N2581 Secondary hyperparathyroidism of renal origin: Secondary | ICD-10-CM | POA: Diagnosis not present

## 2015-09-11 DIAGNOSIS — Z992 Dependence on renal dialysis: Secondary | ICD-10-CM | POA: Diagnosis not present

## 2015-09-11 DIAGNOSIS — D509 Iron deficiency anemia, unspecified: Secondary | ICD-10-CM | POA: Diagnosis not present

## 2015-09-11 DIAGNOSIS — D631 Anemia in chronic kidney disease: Secondary | ICD-10-CM | POA: Diagnosis not present

## 2015-09-12 ENCOUNTER — Ambulatory Visit: Payer: Medicare Other | Admitting: *Deleted

## 2015-09-12 ENCOUNTER — Encounter: Payer: Self-pay | Admitting: *Deleted

## 2015-09-12 ENCOUNTER — Encounter
Admission: RE | Disposition: A | Discharge status: Home / Self Care | Payer: Self-pay | Source: Ambulatory Visit | Attending: Vascular Surgery

## 2015-09-12 ENCOUNTER — Ambulatory Visit
Admission: RE | Admit: 2015-09-12 | Discharge: 2015-09-12 | Disposition: A | Discharge status: Home / Self Care | Payer: Medicare Other | Source: Ambulatory Visit | Attending: Vascular Surgery | Admitting: Vascular Surgery

## 2015-09-12 DIAGNOSIS — Z91012 Allergy to eggs: Secondary | ICD-10-CM | POA: Diagnosis not present

## 2015-09-12 DIAGNOSIS — D649 Anemia, unspecified: Secondary | ICD-10-CM | POA: Diagnosis not present

## 2015-09-12 DIAGNOSIS — Y828 Other medical devices associated with adverse incidents: Secondary | ICD-10-CM | POA: Diagnosis not present

## 2015-09-12 DIAGNOSIS — R0602 Shortness of breath: Secondary | ICD-10-CM | POA: Diagnosis not present

## 2015-09-12 DIAGNOSIS — I739 Peripheral vascular disease, unspecified: Secondary | ICD-10-CM | POA: Insufficient documentation

## 2015-09-12 DIAGNOSIS — T85691A Other mechanical complication of intraperitoneal dialysis catheter, initial encounter: Secondary | ICD-10-CM | POA: Insufficient documentation

## 2015-09-12 DIAGNOSIS — Y9289 Other specified places as the place of occurrence of the external cause: Secondary | ICD-10-CM | POA: Diagnosis not present

## 2015-09-12 DIAGNOSIS — I120 Hypertensive chronic kidney disease with stage 5 chronic kidney disease or end stage renal disease: Secondary | ICD-10-CM | POA: Diagnosis not present

## 2015-09-12 DIAGNOSIS — E669 Obesity, unspecified: Secondary | ICD-10-CM | POA: Diagnosis not present

## 2015-09-12 DIAGNOSIS — N186 End stage renal disease: Secondary | ICD-10-CM | POA: Diagnosis not present

## 2015-09-12 DIAGNOSIS — D631 Anemia in chronic kidney disease: Secondary | ICD-10-CM | POA: Insufficient documentation

## 2015-09-12 DIAGNOSIS — Z992 Dependence on renal dialysis: Secondary | ICD-10-CM | POA: Insufficient documentation

## 2015-09-12 DIAGNOSIS — F329 Major depressive disorder, single episode, unspecified: Secondary | ICD-10-CM | POA: Insufficient documentation

## 2015-09-12 DIAGNOSIS — G4730 Sleep apnea, unspecified: Secondary | ICD-10-CM | POA: Insufficient documentation

## 2015-09-12 DIAGNOSIS — Z6832 Body mass index (BMI) 32.0-32.9, adult: Secondary | ICD-10-CM | POA: Diagnosis not present

## 2015-09-12 DIAGNOSIS — I252 Old myocardial infarction: Secondary | ICD-10-CM | POA: Diagnosis not present

## 2015-09-12 DIAGNOSIS — Z888 Allergy status to other drugs, medicaments and biological substances status: Secondary | ICD-10-CM | POA: Insufficient documentation

## 2015-09-12 HISTORY — PX: REMOVAL OF A DIALYSIS CATHETER: SHX6053

## 2015-09-12 LAB — POCT I-STAT 4, (NA,K, GLUC, HGB,HCT)
Glucose, Bld: 83 mg/dL (ref 65–99)
HCT: 37 % — ABNORMAL LOW (ref 39.0–52.0)
Hemoglobin: 12.6 g/dL — ABNORMAL LOW (ref 13.0–17.0)
POTASSIUM: 5.4 mmol/L — AB (ref 3.5–5.1)
SODIUM: 138 mmol/L (ref 135–145)

## 2015-09-12 SURGERY — REMOVAL, DIALYSIS CATHETER
Anesthesia: General | Wound class: Clean

## 2015-09-12 MED ORDER — BACITRACIN ZINC 500 UNIT/GM EX OINT
TOPICAL_OINTMENT | CUTANEOUS | Status: AC
  Filled 2015-09-12: qty 0.9

## 2015-09-12 MED ORDER — BACITRACIN 500 UNIT/GM EX OINT
TOPICAL_OINTMENT | CUTANEOUS | Status: DC | PRN
  Administered 2015-09-12: 1 via TOPICAL

## 2015-09-12 MED ORDER — BUPIVACAINE HCL (PF) 0.5 % IJ SOLN
INTRAMUSCULAR | Status: AC
  Filled 2015-09-12: qty 30

## 2015-09-12 MED ORDER — EPHEDRINE SULFATE 50 MG/ML IJ SOLN
INTRAMUSCULAR | Status: DC | PRN
  Administered 2015-09-12: 10 mg via INTRAVENOUS

## 2015-09-12 MED ORDER — FAMOTIDINE 20 MG PO TABS
ORAL_TABLET | ORAL | Status: AC
  Administered 2015-09-12: 20 mg via ORAL
  Filled 2015-09-12: qty 1

## 2015-09-12 MED ORDER — SODIUM CHLORIDE 0.9 % IV SOLN
10000.0000 ug | INTRAVENOUS | Status: DC | PRN
  Administered 2015-09-12: 100 ug/min via INTRAVENOUS

## 2015-09-12 MED ORDER — PROPOFOL 10 MG/ML IV BOLUS
INTRAVENOUS | Status: DC | PRN
  Administered 2015-09-12: 200 mg via INTRAVENOUS

## 2015-09-12 MED ORDER — BUPIVACAINE-EPINEPHRINE (PF) 0.5% -1:200000 IJ SOLN
INTRAMUSCULAR | Status: DC | PRN
  Administered 2015-09-12: 30 mL

## 2015-09-12 MED ORDER — LIDOCAINE HCL (CARDIAC) 20 MG/ML IV SOLN
INTRAVENOUS | Status: DC | PRN
  Administered 2015-09-12: 100 mg via INTRAVENOUS

## 2015-09-12 MED ORDER — FAMOTIDINE 20 MG PO TABS
20.0000 mg | ORAL_TABLET | Freq: Once | ORAL | Status: AC
Start: 2015-09-12 — End: 2015-09-12
  Administered 2015-09-12: 20 mg via ORAL

## 2015-09-12 MED ORDER — MIDAZOLAM HCL 2 MG/2ML IJ SOLN
INTRAMUSCULAR | Status: DC | PRN
  Administered 2015-09-12: 2 mg via INTRAVENOUS

## 2015-09-12 MED ORDER — CEFAZOLIN SODIUM-DEXTROSE 2-3 GM-% IV SOLR
INTRAVENOUS | Status: AC
  Administered 2015-09-12: 2 g via INTRAVENOUS
  Filled 2015-09-12: qty 50

## 2015-09-12 MED ORDER — SODIUM CHLORIDE 0.9 % IV SOLN
INTRAVENOUS | Status: DC
  Administered 2015-09-12: 12:00:00 via INTRAVENOUS

## 2015-09-12 MED ORDER — FENTANYL CITRATE (PF) 100 MCG/2ML IJ SOLN
INTRAMUSCULAR | Status: DC | PRN
  Administered 2015-09-12: 50 ug via INTRAVENOUS

## 2015-09-12 MED ORDER — CEFAZOLIN SODIUM-DEXTROSE 2-3 GM-% IV SOLR
2.0000 g | INTRAVENOUS | Status: AC
  Administered 2015-09-12: 2 g via INTRAVENOUS

## 2015-09-12 MED ORDER — HYDROCODONE-ACETAMINOPHEN 5-325 MG PO TABS: 1.0000 | ORAL_TABLET | Freq: Four times a day (QID) | ORAL | Status: DC | PRN

## 2015-09-12 MED ORDER — BUPIVACAINE-EPINEPHRINE (PF) 0.5% -1:200000 IJ SOLN
INTRAMUSCULAR | Status: AC
  Filled 2015-09-12: qty 30

## 2015-09-12 MED ORDER — ONDANSETRON HCL 4 MG/2ML IJ SOLN
INTRAMUSCULAR | Status: DC | PRN
  Administered 2015-09-12: 4 mg via INTRAVENOUS

## 2015-09-12 MED ORDER — PHENYLEPHRINE HCL 10 MG/ML IJ SOLN
INTRAMUSCULAR | Status: DC | PRN
  Administered 2015-09-12: 200 ug via INTRAVENOUS
  Administered 2015-09-12: 100 ug via INTRAVENOUS
  Administered 2015-09-12: 300 ug via INTRAVENOUS
  Administered 2015-09-12: 100 ug via INTRAVENOUS

## 2015-09-12 MED ORDER — ONDANSETRON HCL 4 MG/2ML IJ SOLN: 4.0000 mg | Freq: Once | INTRAMUSCULAR | Status: DC | PRN

## 2015-09-12 MED ORDER — HYDROMORPHONE HCL 1 MG/ML IJ SOLN: 0.2500 mg | INTRAMUSCULAR | Status: DC | PRN

## 2015-09-12 SURGICAL SUPPLY — 24 items
BLADE SURG 15 STRL LF DISP TIS (BLADE) ×1 IMPLANT
BLADE SURG 15 STRL SS (BLADE) ×1
CANISTER SUCT 1200ML W/VALVE (MISCELLANEOUS) ×2 IMPLANT
CHLORAPREP W/TINT 26ML (MISCELLANEOUS) ×2 IMPLANT
DRAPE LAPAROTOMY 100X77 ABD (DRAPES) ×2 IMPLANT
GAUZE STRETCH 2X75IN STRL (MISCELLANEOUS) ×2 IMPLANT
GLOVE SURG SYN 8.0 (GLOVE) ×16 IMPLANT
GOWN STRL REUS W/ TWL LRG LVL3 (GOWN DISPOSABLE) ×3 IMPLANT
GOWN STRL REUS W/ TWL XL LVL3 (GOWN DISPOSABLE) ×1 IMPLANT
GOWN STRL REUS W/TWL LRG LVL3 (GOWN DISPOSABLE) ×3
GOWN STRL REUS W/TWL XL LVL3 (GOWN DISPOSABLE) ×1
KIT RM TURNOVER STRD PROC AR (KITS) ×2 IMPLANT
LABEL OR SOLS (LABEL) ×2 IMPLANT
LIQUID BAND (GAUZE/BANDAGES/DRESSINGS) ×2 IMPLANT
NEEDLE HYPO 25X1 1.5 SAFETY (NEEDLE) ×2 IMPLANT
NS IRRIG 500ML POUR BTL (IV SOLUTION) ×2 IMPLANT
PACK BASIN MINOR ARMC (MISCELLANEOUS) ×2 IMPLANT
PAD GROUND ADULT SPLIT (MISCELLANEOUS) ×2 IMPLANT
SUT MNCRL+ 5-0 UNDYED PC-3 (SUTURE) ×1 IMPLANT
SUT MONOCRYL 5-0 (SUTURE) ×1
SUT VIC AB 0 SH 27 (SUTURE) ×2 IMPLANT
SUT VIC AB 3-0 SH 27 (SUTURE) ×1
SUT VIC AB 3-0 SH 27X BRD (SUTURE) ×1 IMPLANT
SYRINGE 10CC LL (SYRINGE) ×2 IMPLANT

## 2015-09-12 NOTE — Transfer of Care (Signed)
Immediate Anesthesia Transfer of Care Note  Patient: Robert Hays  Procedure(s) Performed: Procedure(s): REMOVAL OF A DIALYSIS CATHETER (N/A)  Patient Location: PACU  Anesthesia Type:General  Level of Consciousness: awake, alert , oriented and patient cooperative  Airway & Oxygen Therapy: Patient Spontanous Breathing and Patient connected to face mask oxygen  Post-op Assessment: Report given to RN, Post -op Vital signs reviewed and stable and Patient moving all extremities X 4  Post vital signs: Reviewed and stable  Last Vitals:  Filed Vitals:   09/12/15 1303  BP: 123/84  Pulse: 86  Temp: 37.7 C  Resp: 32    Complications: No apparent anesthesia complications

## 2015-09-12 NOTE — Anesthesia Preprocedure Evaluation (Addendum)
Anesthesia Evaluation  Patient identified by MRN, date of birth, ID band Patient awake    Reviewed: Allergy & Precautions, NPO status , Patient's Chart, lab work & pertinent test results  Airway Mallampati: II  TM Distance: >3 FB Neck ROM: Full    Dental  (+) Teeth Intact   Pulmonary shortness of breath and with exertion, sleep apnea and Continuous Positive Airway Pressure Ventilation ,  Will have a CPAP machine eventually--does not have it yet.   breath sounds clear to auscultation       Cardiovascular Exercise Tolerance: Good hypertension, Pt. on medications and Pt. on home beta blockers + Past MI and + Peripheral Vascular Disease   Rhythm:Regular Rate:Normal  MI 2015 and has done well since.   Neuro/Psych  Headaches, Depression    GI/Hepatic   Endo/Other    Renal/GU ESRF and DialysisRenal disease     Musculoskeletal   Abdominal (+) + obese,  Abdomen: soft.    Peds  Hematology  (+) anemia , Hb 12.6.   Anesthesia Other Findings   Reproductive/Obstetrics                            Anesthesia Physical Anesthesia Plan  ASA: IV  Anesthesia Plan: General   Post-op Pain Management:    Induction: Intravenous  Airway Management Planned: LMA  Additional Equipment:   Intra-op Plan:   Post-operative Plan: Extubation in OR  Informed Consent: I have reviewed the patients History and Physical, chart, labs and discussed the procedure including the risks, benefits and alternatives for the proposed anesthesia with the patient or authorized representative who has indicated his/her understanding and acceptance.     Plan Discussed with: CRNA  Anesthesia Plan Comments:         Anesthesia Quick Evaluation

## 2015-09-12 NOTE — Discharge Instructions (Signed)

## 2015-09-12 NOTE — OR Nursing (Signed)
Peritoneal dialysis catheter removed and pictures taken and placed on patient chart

## 2015-09-12 NOTE — Anesthesia Procedure Notes (Signed)
Procedure Name: LMA Insertion Date/Time: 09/12/2015 11:56 AM Performed by: Irving Burton Pre-anesthesia Checklist: Patient identified, Emergency Drugs available, Suction available and Patient being monitored Patient Re-evaluated:Patient Re-evaluated prior to inductionOxygen Delivery Method: Circle system utilized Preoxygenation: Pre-oxygenation with 100% oxygen Intubation Type: IV induction Ventilation: Mask ventilation without difficulty LMA: LMA inserted LMA Size: 4.5 Number of attempts: 1 Airway Equipment and Method: Patient positioned with wedge pillow Placement Confirmation: positive ETCO2 and breath sounds checked- equal and bilateral Tube secured with: Tape Dental Injury: Teeth and Oropharynx as per pre-operative assessment

## 2015-09-12 NOTE — H&P (Signed)
 [facility] History & Physical Update  The patient was interviewed and re-examined.  The patient's previous History and Physical has been reviewed and is unchanged.  There is no change in the plan of care. We plan to proceed with the scheduled procedure.  Schnier, Latina Craver, MD  09/12/2015, 11:35 AM

## 2015-09-12 NOTE — Anesthesia Postprocedure Evaluation (Signed)
  Anesthesia Post-op Note  Patient: Robert Hays  Procedure(s) Performed: Procedure(s): REMOVAL OF A DIALYSIS CATHETER (N/A)  Anesthesia type:General  Patient location: PACU  Post pain: Pain level controlled  Post assessment: Post-op Vital signs reviewed, Patient's Cardiovascular Status Stable, Respiratory Function Stable, Patent Airway and No signs of Nausea or vomiting  Post vital signs: Reviewed and stable  Last Vitals:  Filed Vitals:   09/12/15 1007  BP: 157/89  Pulse: 87  Temp: 36.9 C  Resp: 16    Level of consciousness: awake, alert  and patient cooperative  Complications: No apparent anesthesia complications

## 2015-09-12 NOTE — Op Note (Signed)
  OPERATIVE NOTE   PROCEDURE: 1. Removal of peritoneal dialysis catheter placement.  PRE-OPERATIVE DIAGNOSIS: 1. Complication of peritoneal dialysis catheter with poor flow and inefficient dialysis 2. End stage renal disease requiring hemodialysis  POST-OPERATIVE DIAGNOSIS: Same  SURGEON: Tytionna Cloyd, Latina Craver  ASSISTANT(S): Ms. Raul Del  ANESTHESIA: general  ESTIMATED BLOOD LOSS: Minimal   FINDING(S): 1. None  SPECIMEN(S): None  INDICATIONS:  Robert Hays is a 49 y.o. male who presents with nonfunction of his PD catheter. The patient has decided to do peritoneal dialysis for his long-term dialysis. Risks and benefits of placement were discussed and he is agreeable to proceed.  Differences between peritoneal dialysis and hemodialysis were discussed.    DESCRIPTION:  After obtaining full informed written consent, the patient was brought back to the operating room and placed supine upon the operating table. The patient received IV antibiotics prior to induction. After obtaining adequate anesthesia, the abdomen was prepped and draped in the standard fashion.   An incision was made through the previous scar at the umbilicus and the dissection was carried down through the soft tissue to expose the fascia. The cuff was then localized subfascially. 0 Vicryl sutures were then used to secure the fascia to close the fascial defect. The catheter was then transected and the second cuff was identified and dissected free from the surrounding tissues. This cuff was also transected and the remaining portion of the extracorporeal catheter was removed.    I then closed the incisions with a 2-0 Vicryl in the right lower quadrant incision and subsequently 3-0 Vicryl and 4-0 [place] placed Dermabond as dressing. Dry dressing was placed around the catheter exit site. The patient was then awakened from anesthesia and taken to the recovery room in stable condition having tolerated the  procedure well.   COMPLICATIONS: None  CONDITION: None  Renford Dills 09/12/2015, 12:55 PM

## 2015-09-13 DIAGNOSIS — D509 Iron deficiency anemia, unspecified: Secondary | ICD-10-CM | POA: Diagnosis not present

## 2015-09-13 DIAGNOSIS — N186 End stage renal disease: Secondary | ICD-10-CM | POA: Diagnosis not present

## 2015-09-13 DIAGNOSIS — Z992 Dependence on renal dialysis: Secondary | ICD-10-CM | POA: Diagnosis not present

## 2015-09-13 DIAGNOSIS — D631 Anemia in chronic kidney disease: Secondary | ICD-10-CM | POA: Diagnosis not present

## 2015-09-13 DIAGNOSIS — N2581 Secondary hyperparathyroidism of renal origin: Secondary | ICD-10-CM | POA: Diagnosis not present

## 2015-09-16 DIAGNOSIS — N186 End stage renal disease: Secondary | ICD-10-CM | POA: Diagnosis not present

## 2015-09-16 DIAGNOSIS — D631 Anemia in chronic kidney disease: Secondary | ICD-10-CM | POA: Diagnosis not present

## 2015-09-16 DIAGNOSIS — N2581 Secondary hyperparathyroidism of renal origin: Secondary | ICD-10-CM | POA: Diagnosis not present

## 2015-09-16 DIAGNOSIS — D509 Iron deficiency anemia, unspecified: Secondary | ICD-10-CM | POA: Diagnosis not present

## 2015-09-16 DIAGNOSIS — Z992 Dependence on renal dialysis: Secondary | ICD-10-CM | POA: Diagnosis not present

## 2015-09-17 DIAGNOSIS — G609 Hereditary and idiopathic neuropathy, unspecified: Secondary | ICD-10-CM | POA: Diagnosis not present

## 2015-09-17 DIAGNOSIS — I120 Hypertensive chronic kidney disease with stage 5 chronic kidney disease or end stage renal disease: Secondary | ICD-10-CM | POA: Diagnosis not present

## 2015-09-17 DIAGNOSIS — Z992 Dependence on renal dialysis: Secondary | ICD-10-CM | POA: Diagnosis not present

## 2015-09-17 DIAGNOSIS — F329 Major depressive disorder, single episode, unspecified: Secondary | ICD-10-CM | POA: Diagnosis not present

## 2015-09-17 DIAGNOSIS — N186 End stage renal disease: Secondary | ICD-10-CM | POA: Diagnosis not present

## 2015-09-17 DIAGNOSIS — E669 Obesity, unspecified: Secondary | ICD-10-CM | POA: Diagnosis not present

## 2015-09-18 ENCOUNTER — Encounter: Payer: Self-pay | Admitting: Surgery

## 2015-09-18 DIAGNOSIS — Z992 Dependence on renal dialysis: Secondary | ICD-10-CM | POA: Diagnosis not present

## 2015-09-18 DIAGNOSIS — N186 End stage renal disease: Secondary | ICD-10-CM | POA: Diagnosis not present

## 2015-09-18 DIAGNOSIS — N2581 Secondary hyperparathyroidism of renal origin: Secondary | ICD-10-CM | POA: Diagnosis not present

## 2015-09-18 DIAGNOSIS — D509 Iron deficiency anemia, unspecified: Secondary | ICD-10-CM | POA: Diagnosis not present

## 2015-09-18 DIAGNOSIS — D631 Anemia in chronic kidney disease: Secondary | ICD-10-CM | POA: Diagnosis not present

## 2015-09-19 DIAGNOSIS — F329 Major depressive disorder, single episode, unspecified: Secondary | ICD-10-CM | POA: Diagnosis not present

## 2015-09-19 DIAGNOSIS — N186 End stage renal disease: Secondary | ICD-10-CM | POA: Diagnosis not present

## 2015-09-19 DIAGNOSIS — I120 Hypertensive chronic kidney disease with stage 5 chronic kidney disease or end stage renal disease: Secondary | ICD-10-CM | POA: Diagnosis not present

## 2015-09-19 DIAGNOSIS — E669 Obesity, unspecified: Secondary | ICD-10-CM | POA: Diagnosis not present

## 2015-09-19 DIAGNOSIS — Z992 Dependence on renal dialysis: Secondary | ICD-10-CM | POA: Diagnosis not present

## 2015-09-19 DIAGNOSIS — G609 Hereditary and idiopathic neuropathy, unspecified: Secondary | ICD-10-CM | POA: Diagnosis not present

## 2015-09-20 DIAGNOSIS — Z992 Dependence on renal dialysis: Secondary | ICD-10-CM | POA: Diagnosis not present

## 2015-09-20 DIAGNOSIS — N2581 Secondary hyperparathyroidism of renal origin: Secondary | ICD-10-CM | POA: Diagnosis not present

## 2015-09-20 DIAGNOSIS — D631 Anemia in chronic kidney disease: Secondary | ICD-10-CM | POA: Diagnosis not present

## 2015-09-20 DIAGNOSIS — D509 Iron deficiency anemia, unspecified: Secondary | ICD-10-CM | POA: Diagnosis not present

## 2015-09-20 DIAGNOSIS — N186 End stage renal disease: Secondary | ICD-10-CM | POA: Diagnosis not present

## 2015-09-22 ENCOUNTER — Ambulatory Visit (INDEPENDENT_AMBULATORY_CARE_PROVIDER_SITE_OTHER)
Admission: RE | Admit: 2015-09-22 | Discharge: 2015-09-22 | Disposition: A | Discharge status: Home / Self Care | Payer: Medicare Other | Source: Ambulatory Visit | Attending: Surgery | Admitting: Surgery

## 2015-09-22 ENCOUNTER — Other Ambulatory Visit: Payer: Self-pay | Admitting: *Deleted

## 2015-09-22 ENCOUNTER — Encounter: Payer: Self-pay | Admitting: *Deleted

## 2015-09-22 ENCOUNTER — Ambulatory Visit (HOSPITAL_COMMUNITY)
Admission: RE | Admit: 2015-09-22 | Discharge: 2015-09-22 | Disposition: A | Discharge status: Home / Self Care | Payer: Medicare Other | Source: Ambulatory Visit | Attending: Surgery | Admitting: Surgery

## 2015-09-22 ENCOUNTER — Encounter: Payer: Self-pay | Admitting: Surgery

## 2015-09-22 ENCOUNTER — Ambulatory Visit (INDEPENDENT_AMBULATORY_CARE_PROVIDER_SITE_OTHER): Payer: Medicare Other | Admitting: Surgery

## 2015-09-22 VITALS — BP 135/86 | HR 96 | Temp 98.8°F | Resp 16 | Ht 68.0 in | Wt 212.0 lb

## 2015-09-22 DIAGNOSIS — N186 End stage renal disease: Secondary | ICD-10-CM

## 2015-09-22 DIAGNOSIS — I10 Essential (primary) hypertension: Secondary | ICD-10-CM | POA: Diagnosis not present

## 2015-09-22 DIAGNOSIS — G603 Idiopathic progressive neuropathy: Secondary | ICD-10-CM | POA: Diagnosis not present

## 2015-09-22 DIAGNOSIS — Z01810 Encounter for preprocedural cardiovascular examination: Secondary | ICD-10-CM

## 2015-09-22 DIAGNOSIS — Z79899 Other long term (current) drug therapy: Secondary | ICD-10-CM | POA: Diagnosis not present

## 2015-09-22 DIAGNOSIS — M792 Neuralgia and neuritis, unspecified: Secondary | ICD-10-CM | POA: Diagnosis not present

## 2015-09-22 NOTE — Progress Notes (Signed)
HISTORY AND PHYSICAL     CC:  In need of permanent HD access Referring Provider:  Kirstie PeriShah, Ashish, MD  HPI: This is a 49 y.o. male who states he has had end stage renal failure for about 3 years.  He was on PD for most of that time with the exception of the past month.  He had a diatek catheter placed about 1 month ago in [place] and has been on HD since then.  He did have his PD catheter removed on 09/12/15 in [place].  He came off of PD bc he wasn't consistent in his dialysis and therefore is switching to HD.    The pt is left handed.  He dialyzes T/T/S.  He is on on a CCB and BB for hypertension.  He has family hx for ESRD as his mother has been on dialysis for more than 20 years.    The pt states he has pain in his legs, but this has gotten better.  He does take neurontin.  He denies diabetes.     Past Medical History  Diagnosis Date  . Renal disorder   . Hypertension   . Peritoneal dialysis status (HCC)   . Depression   . Neuropathy (HCC)   . Myocardial infarction (HCC)     mild 1/16  . Dysrhythmia   . Peripheral vascular disease (HCC)     right leg  . Sleep apnea     does not have cpap yet  . Shortness of breath dyspnea     doe  . Anemia     Past Surgical History  Procedure Laterality Date  . Hernia repair    . Insertion of dialysis catheter      peritoneal  . Insertion of dialysis catheter      right chest  . Removal of a dialysis catheter N/A 09/12/2015    Procedure: REMOVAL OF A DIALYSIS CATHETER;  Surgeon: Renford DillsGregory G Schnier, MD;  Location: [facility] ORS;  Service: Vascular;  Laterality: N/A;    Allergies  Allergen Reactions  . Allegra [Fexofenadine] Other (See Comments)    hallucinations  . Diphenhydramine Other (See Comments)    mental status change  . Eggs Or Egg-Derived Products Nausea And Vomiting    Current Outpatient Prescriptions  Medication Sig Dispense Refill  . amLODipine (NORVASC) 5 MG tablet Take by mouth daily. am    . DULoxetine  (CYMBALTA) 30 MG capsule Take 30 mg by mouth. pm    . folic acid (FOLVITE) 1 MG tablet Take 1 mg by mouth daily.    Marland Kitchen. FOSRENOL 1000 MG chewable tablet Chew 1,000 mg by mouth 4 (four) times daily. 500mg  with snacks    . gabapentin (NEURONTIN) 300 MG capsule Take 300 mg by mouth 2 (two) times daily.    Marland Kitchen. levothyroxine (SYNTHROID, LEVOTHROID) 25 MCG tablet Take 25 mcg by mouth daily before breakfast.    . loratadine (CLARITIN) 10 MG tablet Take 1 tablet (10 mg total) by mouth daily. 30 tablet 0  . TOPROL XL 25 MG 24 hr tablet Take 25 mg by mouth daily.    Marland Kitchen. HYDROcodone-acetaminophen (NORCO) 5-325 MG tablet Take 1-2 tablets by mouth every 6 (six) hours as needed for moderate pain. (Patient not taking: Reported on 09/22/2015) 60 tablet 0   No current facility-administered medications for this visit.    Family History  Problem Relation Age of Onset  . Hypertension Mother   . Kidney disease Mother   . Diabetes Father  Social History   Social History  . Marital Status: Married    Spouse Name: N/A  . Number of Children: N/A  . Years of Education: N/A   Occupational History  . Not on file.   Social History Main Topics  . Smoking status: Never Smoker   . Smokeless tobacco: Never Used  . Alcohol Use: No  . Drug Use: No  . Sexual Activity: Not on file   Other Topics Concern  . Not on file   Social History Narrative     ROS:  Positive    Negative    All sytems reviewed and are negative  Cardiovascular:  chest pain/pressure  hx of mild MI in the past, which he states was due to too much fluid around his lungs/heart.  palpitations  SOB lying flat  DOE  pain in legs   pain in feet   hx of DVT  hx of phlebitis  swelling in legs  varicose veins  Pulmonary:  productive cough  asthma  wheezing  Neurologic:  weakness in  arms  legs  numbness in  arms  legs difficulty speaking or slurred speech  temporary loss  of vision in one eye  dizziness  Hematologic:  bleeding problems  problems with blood clotting easily  GI  vomiting blood  blood in stool  GU:  burning with urination  blood in urine  Psychiatric:  hx of major depression  Integumentary:  rashes  ulcers  Constitutional:  fever  chills   PHYSICAL EXAMINATION:  Filed Vitals:   09/22/15 1540  BP: 135/86  Pulse: 96  Temp: 98.8 F (37.1 C)  Resp: 16   Body mass index is 32.24 kg/(m^2).  General:  WDWN in NAD Gait: Not observed HENT: WNL, normocephalic Pulmonary: normal non-labored breathing , without Rales, rhonchi,  wheezing Cardiac: RRR, without  Murmurs, rubs or gallops; without carotid bruits Abdomen: soft, NT, no masses Skin: without rashes, without ulcers  Vascular Exam/Pulses:  Right Left  Radial 2+ (normal) 2+ (normal)  Ulnar Unable to palpate  Unable to palpate    Extremities: without ischemic changes, without Gangrene , without cellulitis; without open wounds;  Musculoskeletal: no muscle wasting or atrophy  Neurologic: A&O X 3; Appropriate Affect ; SENSATION: normal; MOTOR FUNCTION:  moving all extremities equally. Speech is fluent/normal   Non-Invasive Vascular Imaging:   Upper extremity vein mapping 09/1715: Right Cephalic:  0.26cm-0.46cm Right basilic:  0.27cm-0.58cm Left Cephalic:  0.13cm-0.35cm Left basilic:  0.30cm-0.77cm  Pt meds includes: Statin:  No. Beta Blocker:  Yes.   Aspirin:  No. ACEI:  No. ARB:  No. Other Antiplatelet/Anticoagulant:  No.    ASSESSMENT/PLAN:: 49 y.o. male with ESRD in need of permanent HD access   -the pt is currently dialyzing via right IJ diatek catheter that was placed in [place] -the pt is left handed.  The vein mapping shows that he does have adequate vein for a right arm fistula.  Dr. Myra Gianotti will schedule the pt for right arm fistula on 10/03/15 and decide intraoperatively whether or not the radial or brachial will be the  best option. -steal syndrome discussed with the pt  -pt understands and agrees to proceed.   Doreatha Massed, PA-C [facility] [phone]  Clinic MD:  Pt seen and examined in conjunction with Dr. Myra Gianotti  I agree with the above.  I have seen and evaluated the patient.  The patient has had a catheter placed for dialysis recently.  Previously he was  receiving dialysis via peritoneal catheter.  This is been removed.  He is left handed.  I have reviewed his vascular lab studies.  I think he is a good candidate for a right arm fistula.  I discussed evaluating the cephalic vein at the wrist and discerning whether or not to proceed with the radiocephalic versus a brachiocephalic fistula.  I discussed the risks of non-maturity, the need for future interventions, the risk of steal syndrome.  All discussions were answered.  His operations been scheduled for 10/03/2015.  Durene Cal

## 2015-09-23 DIAGNOSIS — Z992 Dependence on renal dialysis: Secondary | ICD-10-CM | POA: Diagnosis not present

## 2015-09-23 DIAGNOSIS — D631 Anemia in chronic kidney disease: Secondary | ICD-10-CM | POA: Diagnosis not present

## 2015-09-23 DIAGNOSIS — N2581 Secondary hyperparathyroidism of renal origin: Secondary | ICD-10-CM | POA: Diagnosis not present

## 2015-09-23 DIAGNOSIS — N186 End stage renal disease: Secondary | ICD-10-CM | POA: Diagnosis not present

## 2015-09-23 DIAGNOSIS — D509 Iron deficiency anemia, unspecified: Secondary | ICD-10-CM | POA: Diagnosis not present

## 2015-09-24 DIAGNOSIS — F329 Major depressive disorder, single episode, unspecified: Secondary | ICD-10-CM | POA: Diagnosis not present

## 2015-09-24 DIAGNOSIS — E669 Obesity, unspecified: Secondary | ICD-10-CM | POA: Diagnosis not present

## 2015-09-24 DIAGNOSIS — I120 Hypertensive chronic kidney disease with stage 5 chronic kidney disease or end stage renal disease: Secondary | ICD-10-CM | POA: Diagnosis not present

## 2015-09-24 DIAGNOSIS — Z992 Dependence on renal dialysis: Secondary | ICD-10-CM | POA: Diagnosis not present

## 2015-09-24 DIAGNOSIS — N186 End stage renal disease: Secondary | ICD-10-CM | POA: Diagnosis not present

## 2015-09-24 DIAGNOSIS — G609 Hereditary and idiopathic neuropathy, unspecified: Secondary | ICD-10-CM | POA: Diagnosis not present

## 2015-09-26 DIAGNOSIS — N2581 Secondary hyperparathyroidism of renal origin: Secondary | ICD-10-CM | POA: Diagnosis not present

## 2015-09-26 DIAGNOSIS — D509 Iron deficiency anemia, unspecified: Secondary | ICD-10-CM | POA: Diagnosis not present

## 2015-09-26 DIAGNOSIS — N186 End stage renal disease: Secondary | ICD-10-CM | POA: Diagnosis not present

## 2015-09-26 DIAGNOSIS — Z992 Dependence on renal dialysis: Secondary | ICD-10-CM | POA: Diagnosis not present

## 2015-09-26 DIAGNOSIS — D631 Anemia in chronic kidney disease: Secondary | ICD-10-CM | POA: Diagnosis not present

## 2015-09-27 DIAGNOSIS — N186 End stage renal disease: Secondary | ICD-10-CM | POA: Diagnosis not present

## 2015-09-27 DIAGNOSIS — Z992 Dependence on renal dialysis: Secondary | ICD-10-CM | POA: Diagnosis not present

## 2015-09-27 DIAGNOSIS — N2581 Secondary hyperparathyroidism of renal origin: Secondary | ICD-10-CM | POA: Diagnosis not present

## 2015-09-27 DIAGNOSIS — D509 Iron deficiency anemia, unspecified: Secondary | ICD-10-CM | POA: Diagnosis not present

## 2015-09-27 DIAGNOSIS — D631 Anemia in chronic kidney disease: Secondary | ICD-10-CM | POA: Diagnosis not present

## 2015-09-29 DIAGNOSIS — Z992 Dependence on renal dialysis: Secondary | ICD-10-CM | POA: Diagnosis not present

## 2015-09-29 DIAGNOSIS — N186 End stage renal disease: Secondary | ICD-10-CM | POA: Diagnosis not present

## 2015-09-29 DIAGNOSIS — Y841 Kidney dialysis as the cause of abnormal reaction of the patient, or of later complication, without mention of misadventure at the time of the procedure: Secondary | ICD-10-CM | POA: Diagnosis not present

## 2015-09-29 DIAGNOSIS — I10 Essential (primary) hypertension: Secondary | ICD-10-CM | POA: Diagnosis not present

## 2015-09-30 DIAGNOSIS — N186 End stage renal disease: Secondary | ICD-10-CM | POA: Diagnosis not present

## 2015-09-30 DIAGNOSIS — D509 Iron deficiency anemia, unspecified: Secondary | ICD-10-CM | POA: Diagnosis not present

## 2015-09-30 DIAGNOSIS — D631 Anemia in chronic kidney disease: Secondary | ICD-10-CM | POA: Diagnosis not present

## 2015-09-30 DIAGNOSIS — N2581 Secondary hyperparathyroidism of renal origin: Secondary | ICD-10-CM | POA: Diagnosis not present

## 2015-09-30 DIAGNOSIS — Z992 Dependence on renal dialysis: Secondary | ICD-10-CM | POA: Diagnosis not present

## 2015-10-01 DIAGNOSIS — N186 End stage renal disease: Secondary | ICD-10-CM | POA: Diagnosis not present

## 2015-10-01 DIAGNOSIS — I120 Hypertensive chronic kidney disease with stage 5 chronic kidney disease or end stage renal disease: Secondary | ICD-10-CM | POA: Diagnosis not present

## 2015-10-01 DIAGNOSIS — F329 Major depressive disorder, single episode, unspecified: Secondary | ICD-10-CM | POA: Diagnosis not present

## 2015-10-01 DIAGNOSIS — Z992 Dependence on renal dialysis: Secondary | ICD-10-CM | POA: Diagnosis not present

## 2015-10-01 DIAGNOSIS — G609 Hereditary and idiopathic neuropathy, unspecified: Secondary | ICD-10-CM | POA: Diagnosis not present

## 2015-10-01 DIAGNOSIS — E669 Obesity, unspecified: Secondary | ICD-10-CM | POA: Diagnosis not present

## 2015-10-02 ENCOUNTER — Encounter (HOSPITAL_COMMUNITY): Payer: Self-pay | Admitting: *Deleted

## 2015-10-02 DIAGNOSIS — N2581 Secondary hyperparathyroidism of renal origin: Secondary | ICD-10-CM | POA: Diagnosis not present

## 2015-10-02 DIAGNOSIS — Z992 Dependence on renal dialysis: Secondary | ICD-10-CM | POA: Diagnosis not present

## 2015-10-02 DIAGNOSIS — N186 End stage renal disease: Secondary | ICD-10-CM | POA: Diagnosis not present

## 2015-10-02 DIAGNOSIS — D509 Iron deficiency anemia, unspecified: Secondary | ICD-10-CM | POA: Diagnosis not present

## 2015-10-02 DIAGNOSIS — D631 Anemia in chronic kidney disease: Secondary | ICD-10-CM | POA: Diagnosis not present

## 2015-10-02 DIAGNOSIS — Z79899 Other long term (current) drug therapy: Secondary | ICD-10-CM | POA: Diagnosis not present

## 2015-10-02 MED ORDER — DEXTROSE 5 % IV SOLN
1.5000 g | INTRAVENOUS | Status: AC
  Administered 2015-10-03: 1.5 g via INTRAVENOUS
  Filled 2015-10-02: qty 1.5

## 2015-10-02 MED ORDER — CHLORHEXIDINE GLUCONATE CLOTH 2 % EX PADS: 6.0000 | MEDICATED_PAD | Freq: Once | CUTANEOUS | Status: DC

## 2015-10-02 MED ORDER — SODIUM CHLORIDE 0.9 % IV SOLN
INTRAVENOUS | Status: DC
  Administered 2015-10-03 (×2): via INTRAVENOUS

## 2015-10-02 NOTE — Progress Notes (Signed)
Called pt for pre-op call and when I was giving him the arrival time of 9:00 AM, pt stated he wouldn't be able to get here until 11:30 AM (which is surgery start time). I told him we needed him here earlier but he said 11:30 was the best he could do. I told him I would have to call the on call surgeon and talk with him and I would call back. Dr. Imogene Burnhen is on call and he returned my page. He states he spoke with Dr. Myra GianottiBrabham and Dr. Myra GianottiBrabham is fine with pt getting here at 11:30 AM. I called pt back and told him this and I finished giving him pre-op instructions.

## 2015-10-03 ENCOUNTER — Ambulatory Visit (HOSPITAL_COMMUNITY)
Admission: RE | Admit: 2015-10-03 | Discharge: 2015-10-03 | Disposition: A | Discharge status: Home / Self Care | Payer: Medicare Other | Source: Ambulatory Visit | Attending: Surgery | Admitting: Surgery

## 2015-10-03 ENCOUNTER — Ambulatory Visit (HOSPITAL_COMMUNITY): Payer: Medicare Other | Admitting: Certified Registered"

## 2015-10-03 ENCOUNTER — Other Ambulatory Visit: Payer: Self-pay | Admitting: *Deleted

## 2015-10-03 ENCOUNTER — Encounter (HOSPITAL_COMMUNITY)
Admission: RE | Disposition: A | Discharge status: Home / Self Care | Payer: Self-pay | Source: Ambulatory Visit | Attending: Surgery

## 2015-10-03 ENCOUNTER — Encounter (HOSPITAL_COMMUNITY): Payer: Self-pay | Admitting: *Deleted

## 2015-10-03 ENCOUNTER — Telehealth: Payer: Self-pay | Admitting: Surgery

## 2015-10-03 DIAGNOSIS — N186 End stage renal disease: Secondary | ICD-10-CM | POA: Diagnosis not present

## 2015-10-03 DIAGNOSIS — Z79899 Other long term (current) drug therapy: Secondary | ICD-10-CM | POA: Diagnosis not present

## 2015-10-03 DIAGNOSIS — J45909 Unspecified asthma, uncomplicated: Secondary | ICD-10-CM | POA: Diagnosis not present

## 2015-10-03 DIAGNOSIS — Z6832 Body mass index (BMI) 32.0-32.9, adult: Secondary | ICD-10-CM | POA: Insufficient documentation

## 2015-10-03 DIAGNOSIS — I739 Peripheral vascular disease, unspecified: Secondary | ICD-10-CM | POA: Diagnosis not present

## 2015-10-03 DIAGNOSIS — Z4931 Encounter for adequacy testing for hemodialysis: Secondary | ICD-10-CM

## 2015-10-03 DIAGNOSIS — N189 Chronic kidney disease, unspecified: Secondary | ICD-10-CM | POA: Diagnosis not present

## 2015-10-03 DIAGNOSIS — G4730 Sleep apnea, unspecified: Secondary | ICD-10-CM | POA: Insufficient documentation

## 2015-10-03 DIAGNOSIS — Z992 Dependence on renal dialysis: Secondary | ICD-10-CM | POA: Diagnosis not present

## 2015-10-03 DIAGNOSIS — I252 Old myocardial infarction: Secondary | ICD-10-CM | POA: Insufficient documentation

## 2015-10-03 DIAGNOSIS — N185 Chronic kidney disease, stage 5: Secondary | ICD-10-CM | POA: Diagnosis not present

## 2015-10-03 DIAGNOSIS — D649 Anemia, unspecified: Secondary | ICD-10-CM | POA: Diagnosis not present

## 2015-10-03 DIAGNOSIS — I120 Hypertensive chronic kidney disease with stage 5 chronic kidney disease or end stage renal disease: Secondary | ICD-10-CM | POA: Diagnosis not present

## 2015-10-03 DIAGNOSIS — F329 Major depressive disorder, single episode, unspecified: Secondary | ICD-10-CM | POA: Diagnosis not present

## 2015-10-03 HISTORY — PX: AV FISTULA PLACEMENT: SHX1204

## 2015-10-03 HISTORY — DX: Umbilical hernia without obstruction or gangrene: K42.9

## 2015-10-03 HISTORY — DX: Unspecified asthma, uncomplicated: J45.909

## 2015-10-03 HISTORY — DX: Hypothyroidism, unspecified: E03.9

## 2015-10-03 LAB — POCT I-STAT 4, (NA,K, GLUC, HGB,HCT)
Glucose, Bld: 87 mg/dL (ref 65–99)
HEMATOCRIT: 40 % (ref 39.0–52.0)
Hemoglobin: 13.6 g/dL (ref 13.0–17.0)
Potassium: 5.1 mmol/L (ref 3.5–5.1)
Sodium: 138 mmol/L (ref 135–145)

## 2015-10-03 SURGERY — ARTERIOVENOUS (AV) FISTULA CREATION
Anesthesia: Monitor Anesthesia Care | Site: Arm Lower | Laterality: Right

## 2015-10-03 MED ORDER — HEPARIN SODIUM (PORCINE) 1000 UNIT/ML IJ SOLN
INTRAMUSCULAR | Status: DC | PRN
  Administered 2015-10-03: 3000 [IU] via INTRAVENOUS

## 2015-10-03 MED ORDER — KETAMINE HCL 10 MG/ML IJ SOLN
INTRAMUSCULAR | Status: DC | PRN
  Administered 2015-10-03 (×4): 25 mg via INTRAVENOUS

## 2015-10-03 MED ORDER — PROPOFOL 10 MG/ML IV BOLUS
INTRAVENOUS | Status: AC
  Filled 2015-10-03: qty 20

## 2015-10-03 MED ORDER — LIDOCAINE-EPINEPHRINE (PF) 1 %-1:200000 IJ SOLN
INTRAMUSCULAR | Status: AC
  Filled 2015-10-03: qty 30

## 2015-10-03 MED ORDER — THROMBIN 20000 UNITS EX SOLR
CUTANEOUS | Status: AC
  Filled 2015-10-03: qty 20000

## 2015-10-03 MED ORDER — MEPERIDINE HCL 25 MG/ML IJ SOLN: 6.2500 mg | INTRAMUSCULAR | Status: DC | PRN

## 2015-10-03 MED ORDER — SODIUM CHLORIDE 0.9 % IV SOLN
INTRAVENOUS | Status: DC | PRN
  Administered 2015-10-03: 500 mL

## 2015-10-03 MED ORDER — FENTANYL CITRATE (PF) 100 MCG/2ML IJ SOLN: 25.0000 ug | INTRAMUSCULAR | Status: DC | PRN

## 2015-10-03 MED ORDER — LIDOCAINE-EPINEPHRINE (PF) 1 %-1:200000 IJ SOLN
INTRAMUSCULAR | Status: DC | PRN
  Administered 2015-10-03: 30 mL

## 2015-10-03 MED ORDER — KETAMINE HCL 100 MG/ML IJ SOLN
INTRAMUSCULAR | Status: AC
  Filled 2015-10-03: qty 1

## 2015-10-03 MED ORDER — 0.9 % SODIUM CHLORIDE (POUR BTL) OPTIME
TOPICAL | Status: DC | PRN
  Administered 2015-10-03: 1000 mL

## 2015-10-03 MED ORDER — ONDANSETRON HCL 4 MG/2ML IJ SOLN
INTRAMUSCULAR | Status: DC | PRN
  Administered 2015-10-03: 4 mg via INTRAVENOUS

## 2015-10-03 MED ORDER — GLYCOPYRROLATE 0.2 MG/ML IJ SOLN
INTRAMUSCULAR | Status: DC | PRN
  Administered 2015-10-03: 0.1 mg via INTRAVENOUS

## 2015-10-03 MED ORDER — OXYCODONE HCL 5 MG PO TABS
5.0000 mg | ORAL_TABLET | Freq: Four times a day (QID) | ORAL | Status: DC | PRN
Start: 2015-10-03 — End: 2016-07-30

## 2015-10-03 MED ORDER — PROTAMINE SULFATE 10 MG/ML IV SOLN
INTRAVENOUS | Status: DC | PRN
  Administered 2015-10-03: 25 mg via INTRAVENOUS

## 2015-10-03 MED ORDER — LIDOCAINE HCL (CARDIAC) 20 MG/ML IV SOLN
INTRAVENOUS | Status: DC | PRN
  Administered 2015-10-03: 50 mg via INTRAVENOUS

## 2015-10-03 MED ORDER — PROPOFOL 500 MG/50ML IV EMUL
INTRAVENOUS | Status: DC | PRN
  Administered 2015-10-03: 100 ug/kg/min via INTRAVENOUS

## 2015-10-03 MED ORDER — PROPOFOL 10 MG/ML IV BOLUS
INTRAVENOUS | Status: DC | PRN
  Administered 2015-10-03 (×2): 50 mg via INTRAVENOUS

## 2015-10-03 MED ORDER — PHENYLEPHRINE HCL 10 MG/ML IJ SOLN
INTRAMUSCULAR | Status: DC | PRN
  Administered 2015-10-03: 80 ug via INTRAVENOUS

## 2015-10-03 SURGICAL SUPPLY — 31 items
ARMBAND PINK RESTRICT EXTREMIT (MISCELLANEOUS) ×3 IMPLANT
CANISTER SUCTION 2500CC (MISCELLANEOUS) ×3 IMPLANT
CLIP TI MEDIUM 6 (CLIP) ×3 IMPLANT
CLIP TI WIDE RED SMALL 6 (CLIP) ×3 IMPLANT
COVER PROBE W GEL 5X96 (DRAPES) ×3 IMPLANT
ELECT REM PT RETURN 9FT ADLT (ELECTROSURGICAL) ×3
ELECTRODE REM PT RTRN 9FT ADLT (ELECTROSURGICAL) ×1 IMPLANT
GLOVE BIO SURGEON STRL SZ 6.5 (GLOVE) ×8 IMPLANT
GLOVE BIO SURGEONS STRL SZ 6.5 (GLOVE) ×4
GLOVE BIOGEL PI IND STRL 6.5 (GLOVE) ×2 IMPLANT
GLOVE BIOGEL PI IND STRL 7.5 (GLOVE) ×1 IMPLANT
GLOVE BIOGEL PI INDICATOR 6.5 (GLOVE) ×4
GLOVE BIOGEL PI INDICATOR 7.5 (GLOVE) ×2
GLOVE SURG SS PI 7.5 STRL IVOR (GLOVE) ×3 IMPLANT
GOWN STRL REUS W/ TWL LRG LVL3 (GOWN DISPOSABLE) ×2 IMPLANT
GOWN STRL REUS W/ TWL XL LVL3 (GOWN DISPOSABLE) ×1 IMPLANT
GOWN STRL REUS W/TWL LRG LVL3 (GOWN DISPOSABLE) ×4
GOWN STRL REUS W/TWL XL LVL3 (GOWN DISPOSABLE) ×2
HEMOSTAT SNOW SURGICEL 2X4 (HEMOSTASIS) IMPLANT
KIT BASIN OR (CUSTOM PROCEDURE TRAY) ×3 IMPLANT
KIT ROOM TURNOVER OR (KITS) ×3 IMPLANT
LIQUID BAND (GAUZE/BANDAGES/DRESSINGS) ×3 IMPLANT
NS IRRIG 1000ML POUR BTL (IV SOLUTION) ×3 IMPLANT
PACK CV ACCESS (CUSTOM PROCEDURE TRAY) ×3 IMPLANT
PAD ARMBOARD 7.5X6 YLW CONV (MISCELLANEOUS) ×6 IMPLANT
SUT PROLENE 6 0 CC (SUTURE) ×3 IMPLANT
SUT VIC AB 3-0 SH 27 (SUTURE) ×2
SUT VIC AB 3-0 SH 27X BRD (SUTURE) ×1 IMPLANT
SUT VICRYL 4-0 PS2 18IN ABS (SUTURE) IMPLANT
UNDERPAD 30X30 INCONTINENT (UNDERPADS AND DIAPERS) ×3 IMPLANT
WATER STERILE IRR 1000ML POUR (IV SOLUTION) ×3 IMPLANT

## 2015-10-03 NOTE — Op Note (Signed)
    Patient name: Robert DrillingClifford Dacey MRN: [id number] DOB: 1966-10-12 Sex: male  10/03/2015 Pre-operative Diagnosis: ESRD Post-operative diagnosis:  Same Surgeon:  Durene CalBrabham, Wells Assistants:  Doreatha MassedSamantha Rhyne Procedure:   Right Radio-cephalic fistula Anesthesia:  MAC Blood Loss:  See anesthesia record Specimens:  none  Findings:  3 mm cephalic vein.  2 mm radial artery  Indications:  The patient has been on peritoneal dialysis and recently converted to hemodialysis.  He has a dialysis catheter.  He is left-handed.  He comes in today for a right arm fistula  Procedure:  The patient was identified in the holding area and taken to [facility] OR ROOM 12  The patient was then placed supine on the table. MAC anesthesia was administered.  The patient was prepped and draped in the usual sterile fashion.  A time out was called and antibiotics were administered.  Ultrasound was used to evaluate the right cephalic vein.  It appeared to be adequate.  One percent lidocaine was used for local anesthesia.  A longitudinal incision was made just proximal to the wrist.  I dissected out the cephalic vein.  It appeared to be very healthy about 3 mm in diameter.  It was mobilized throughout the length of the incision.  I ligated all the branches between silk ties.  I then dissected out the radial artery.  This was a small 0.5-2 mm artery which was disease-free.  Once this was fully dissected free it was encircled with vessel loops.  3000 units heparin was given.  After the heparin circulated, the vein was ligated distally.  It was distended with heparinized saline.  It distended to approximately 4.5 mm.  Next, the radial artery was occluded.  A #11 blade was used to make an arteriotomy which was extended longitudinally with Potts scissors.  The vein was cut the appropriate length.  A end to side anastomosis was then created with running 6-0 Prolene.  Prior to completion, the appropriate flushing maneuvers were performed and the  anastomosis was completed.  I inspected the course of the vein to make sure there were.  There were adequate Doppler signals distal to the fistula.  There was a palpable thrill within the fistula.  25 mg of protamine was given.  The incision was closed in 2 layers of 3-0 Vicryl, followed by Dermabond.   Disposition:  To PACU in stable condition.   [name], M.D. [facility] [place] Office: [phone] Pager:  [phone]

## 2015-10-03 NOTE — H&P (View-Only) (Signed)
HISTORY AND PHYSICAL     CC:  In need of permanent HD access Referring Provider:  Kirstie PeriShah, Ashish, MD  HPI: This is a 49 y.o. male who states he has had end stage renal failure for about 3 years.  He was on PD for most of that time with the exception of the past month.  He had a diatek catheter placed about 1 month ago in [place] and has been on HD since then.  He did have his PD catheter removed on 09/12/15 in [place].  He came off of PD bc he wasn't consistent in his dialysis and therefore is switching to HD.    The pt is left handed.  He dialyzes T/T/S.  He is on on a CCB and BB for hypertension.  He has family hx for ESRD as his mother has been on dialysis for more than 20 years.    The pt states he has pain in his legs, but this has gotten better.  He does take neurontin.  He denies diabetes.     Past Medical History  Diagnosis Date  . Renal disorder   . Hypertension   . Peritoneal dialysis status (HCC)   . Depression   . Neuropathy (HCC)   . Myocardial infarction (HCC)     mild 1/16  . Dysrhythmia   . Peripheral vascular disease (HCC)     right leg  . Sleep apnea     does not have cpap yet  . Shortness of breath dyspnea     doe  . Anemia     Past Surgical History  Procedure Laterality Date  . Hernia repair    . Insertion of dialysis catheter      peritoneal  . Insertion of dialysis catheter      right chest  . Removal of a dialysis catheter N/A 09/12/2015    Procedure: REMOVAL OF A DIALYSIS CATHETER;  Surgeon: Renford DillsGregory G Schnier, MD;  Location: [facility] ORS;  Service: Vascular;  Laterality: N/A;    Allergies  Allergen Reactions  . Allegra [Fexofenadine] Other (See Comments)    hallucinations  . Diphenhydramine Other (See Comments)    mental status change  . Eggs Or Egg-Derived Products Nausea And Vomiting    Current Outpatient Prescriptions  Medication Sig Dispense Refill  . amLODipine (NORVASC) 5 MG tablet Take by mouth daily. am    . DULoxetine  (CYMBALTA) 30 MG capsule Take 30 mg by mouth. pm    . folic acid (FOLVITE) 1 MG tablet Take 1 mg by mouth daily.    Marland Kitchen. FOSRENOL 1000 MG chewable tablet Chew 1,000 mg by mouth 4 (four) times daily. 500mg  with snacks    . gabapentin (NEURONTIN) 300 MG capsule Take 300 mg by mouth 2 (two) times daily.    Marland Kitchen. levothyroxine (SYNTHROID, LEVOTHROID) 25 MCG tablet Take 25 mcg by mouth daily before breakfast.    . loratadine (CLARITIN) 10 MG tablet Take 1 tablet (10 mg total) by mouth daily. 30 tablet 0  . TOPROL XL 25 MG 24 hr tablet Take 25 mg by mouth daily.    Marland Kitchen. HYDROcodone-acetaminophen (NORCO) 5-325 MG tablet Take 1-2 tablets by mouth every 6 (six) hours as needed for moderate pain. (Patient not taking: Reported on 09/22/2015) 60 tablet 0   No current facility-administered medications for this visit.    Family History  Problem Relation Age of Onset  . Hypertension Mother   . Kidney disease Mother   . Diabetes Father  Social History   Social History  . Marital Status: Married    Spouse Name: N/A  . Number of Children: N/A  . Years of Education: N/A   Occupational History  . Not on file.   Social History Main Topics  . Smoking status: Never Smoker   . Smokeless tobacco: Never Used  . Alcohol Use: No  . Drug Use: No  . Sexual Activity: Not on file   Other Topics Concern  . Not on file   Social History Narrative     ROS: [x] Positive   [ ] Negative   [ ] All sytems reviewed and are negative  Cardiovascular: [] chest pain/pressure [x] hx of mild MI in the past, which he states was due to too much fluid around his lungs/heart. [] palpitations [] SOB lying flat [] DOE [x] pain in legs  [x] pain in feet  [] hx of DVT [] hx of phlebitis [x] swelling in legs [] varicose veins  Pulmonary: [] productive cough [x] asthma [x] wheezing  Neurologic: [] weakness in [] arms [] legs [] numbness in [] arms [] legs []difficulty speaking or slurred speech [] temporary loss  of vision in one eye [] dizziness  Hematologic: [] bleeding problems [] problems with blood clotting easily  GI [] vomiting blood [] blood in stool  GU: [] burning with urination [] blood in urine  Psychiatric: [] hx of major depression  Integumentary: [] rashes [] ulcers  Constitutional: [] fever [] chills   PHYSICAL EXAMINATION:  Filed Vitals:   09/22/15 1540  BP: 135/86  Pulse: 96  Temp: 98.8 F (37.1 C)  Resp: 16   Body mass index is 32.24 kg/(m^2).  General:  WDWN in NAD Gait: Not observed HENT: WNL, normocephalic Pulmonary: normal non-labored breathing , without Rales, rhonchi,  wheezing Cardiac: RRR, without  Murmurs, rubs or gallops; without carotid bruits Abdomen: soft, NT, no masses Skin: without rashes, without ulcers  Vascular Exam/Pulses:  Right Left  Radial 2+ (normal) 2+ (normal)  Ulnar Unable to palpate  Unable to palpate    Extremities: without ischemic changes, without Gangrene , without cellulitis; without open wounds;  Musculoskeletal: no muscle wasting or atrophy  Neurologic: A&O X 3; Appropriate Affect ; SENSATION: normal; MOTOR FUNCTION:  moving all extremities equally. Speech is fluent/normal   Non-Invasive Vascular Imaging:   Upper extremity vein mapping 09/1715: Right Cephalic:  0.26cm-0.46cm Right basilic:  0.27cm-0.58cm Left Cephalic:  0.13cm-0.35cm Left basilic:  0.30cm-0.77cm  Pt meds includes: Statin:  No. Beta Blocker:  Yes.   Aspirin:  No. ACEI:  No. ARB:  No. Other Antiplatelet/Anticoagulant:  No.    ASSESSMENT/PLAN:: 49 y.o. male with ESRD in need of permanent HD access   -the pt is currently dialyzing via right IJ diatek catheter that was placed in [place] -the pt is left handed.  The vein mapping shows that he does have adequate vein for a right arm fistula.  Dr. Brabham will schedule the pt for right arm fistula on 10/03/15 and decide intraoperatively whether or not the radial or brachial will be the  best option. -steal syndrome discussed with the pt  -pt understands and agrees to proceed.   Samantha Rhyne, PA-C [facility] [phone]  Clinic MD:  Pt seen and examined in conjunction with Dr. Brabham  I agree with the above.  I have seen and evaluated the patient.  The patient has had a catheter placed for dialysis recently.  Previously he was   receiving dialysis via peritoneal catheter.  This is been removed.  He is left handed.  I have reviewed his vascular lab studies.  I think he is a good candidate for a right arm fistula.  I discussed evaluating the cephalic vein at the wrist and discerning whether or not to proceed with the radiocephalic versus a brachiocephalic fistula.  I discussed the risks of non-maturity, the need for future interventions, the risk of steal syndrome.  All discussions were answered.  His operations been scheduled for 10/03/2015.  Durene Cal

## 2015-10-03 NOTE — Anesthesia Preprocedure Evaluation (Addendum)
Anesthesia Evaluation  Patient identified by MRN, date of birth, ID band Patient awake    Reviewed: Allergy & Precautions, NPO status , Patient's Chart, lab work & pertinent test results  Airway Mallampati: II  TM Distance: >3 FB Neck ROM: full    Dental  (+) Poor Dentition, Chipped, Missing, Loose   Pulmonary shortness of breath and with exertion, asthma , sleep apnea (no machine yet) ,    breath sounds clear to auscultation       Cardiovascular hypertension, Pt. on medications + Past MI (10/2014 ) and + Peripheral Vascular Disease  + dysrhythmias  Rhythm:regular Rate:Normal     Neuro/Psych  Headaches, PSYCHIATRIC DISORDERS Depression    GI/Hepatic   Endo/Other  Hypothyroidism Morbid obesity  Renal/GU CRFRenal disease (T, TH, Sat HD....last HD 10/27 had a 2# weight change)     Musculoskeletal   Abdominal   Peds  Hematology  (+) anemia ,   Anesthesia Other Findings   Reproductive/Obstetrics                             Anesthesia Physical Anesthesia Plan  ASA: III  Anesthesia Plan: MAC   Post-op Pain Management:    Induction: Intravenous  Airway Management Planned: Simple Face Mask  Additional Equipment:   Intra-op Plan:   Post-operative Plan:   Informed Consent: I have reviewed the patients History and Physical, chart, labs and discussed the procedure including the risks, benefits and alternatives for the proposed anesthesia with the patient or authorized representative who has indicated his/her understanding and acceptance.   Dental advisory given  Plan Discussed with: CRNA, Anesthesiologist and Surgeon  Anesthesia Plan Comments:        Anesthesia Quick Evaluation

## 2015-10-03 NOTE — Interval H&P Note (Signed)
History and Physical Interval Note:  10/03/2015 12:26 PM  Robert Hays  has presented today for surgery, with the diagnosis of Chronic Kidney disease  The various methods of treatment have been discussed with the patient and family. After consideration of risks, benefits and other options for treatment, the patient has consented to  Procedure(s): ARTERIOVENOUS (AV) FISTULA CREATION (Right) as a surgical intervention .  The patient's history has been reviewed, patient examined, no change in status, stable for surgery.  I have reviewed the patient's chart and labs.  Questions were answered to the patient's satisfaction.     Durene CalBrabham, [place]

## 2015-10-03 NOTE — Transfer of Care (Signed)
Immediate Anesthesia Transfer of Care Note  Patient: Robert Hays Drillinglifford Beyl  Procedure(s) Performed: Procedure(s): Right arm ARTERIOVENOUS  FISTULA CREATION (Right)  Patient Location: PACU  Anesthesia Type:MAC  Level of Consciousness: alert , oriented and sedated  Airway & Oxygen Therapy: Patient Spontanous Breathing and Patient connected to face mask oxygen  Post-op Assessment: Report given to RN and Post -op Vital signs reviewed and stable  Post vital signs: Reviewed and stable  Last Vitals:  Filed Vitals:   10/03/15 1142  BP: 141/71  Pulse: 92  Temp: 37.1 C  Resp: 18    Complications: No apparent anesthesia complications

## 2015-10-03 NOTE — Telephone Encounter (Signed)
-----   Message from Dara LordsSamantha J Rhyne, [place] sent at 10/03/2015  1:36 PM EDT ----- S/p right RC AVF 10/03/15.  F/u with Dr. Myra GianottiBrabham in 6 weeks with duplex.  Thanks, Lelon MastSamantha

## 2015-10-03 NOTE — Anesthesia Postprocedure Evaluation (Signed)
  Anesthesia Post-op Note  Patient: Robert Hays  Procedure(s) Performed: Procedure(s): Right arm ARTERIOVENOUS  FISTULA CREATION (Right)  Patient Location: PACU  Anesthesia Type: MAC   Level of Consciousness: awake, alert  and oriented  Airway and Oxygen Therapy: Patient Spontanous Breathing  Post-op Pain: mild  Post-op Assessment: Post-op Vital signs reviewed  Post-op Vital Signs: Reviewed  Last Vitals:  Filed Vitals:   10/03/15 1407  BP: 122/77  Pulse: 94  Temp:   Resp: 13    Complications: No apparent anesthesia complications

## 2015-10-03 NOTE — Discharge Instructions (Signed)
° ° °  10/03/2015 Robert DrillingClifford Hays [id number] 1966-04-08  Surgeon(s): Nada LibmanVance W Brabham, MD  Procedure(s): Right arm ARTERIOVENOUS  FISTULA CREATION  x Do not fistula for 12 weeks

## 2015-10-03 NOTE — Progress Notes (Signed)
Pt has pre existing HD Diatek in RU chest, both ports capped & clamped

## 2015-10-03 NOTE — Telephone Encounter (Signed)
LM for pt re appt, dpm °

## 2015-10-04 DIAGNOSIS — N2581 Secondary hyperparathyroidism of renal origin: Secondary | ICD-10-CM | POA: Diagnosis not present

## 2015-10-04 DIAGNOSIS — D509 Iron deficiency anemia, unspecified: Secondary | ICD-10-CM | POA: Diagnosis not present

## 2015-10-04 DIAGNOSIS — N186 End stage renal disease: Secondary | ICD-10-CM | POA: Diagnosis not present

## 2015-10-04 DIAGNOSIS — D631 Anemia in chronic kidney disease: Secondary | ICD-10-CM | POA: Diagnosis not present

## 2015-10-04 DIAGNOSIS — Z992 Dependence on renal dialysis: Secondary | ICD-10-CM | POA: Diagnosis not present

## 2015-10-06 ENCOUNTER — Encounter (HOSPITAL_COMMUNITY): Payer: Self-pay | Admitting: Surgery

## 2015-10-06 DIAGNOSIS — N186 End stage renal disease: Secondary | ICD-10-CM | POA: Diagnosis not present

## 2015-10-06 DIAGNOSIS — Z992 Dependence on renal dialysis: Secondary | ICD-10-CM | POA: Diagnosis not present

## 2015-10-07 DIAGNOSIS — D631 Anemia in chronic kidney disease: Secondary | ICD-10-CM | POA: Diagnosis not present

## 2015-10-07 DIAGNOSIS — Z992 Dependence on renal dialysis: Secondary | ICD-10-CM | POA: Diagnosis not present

## 2015-10-07 DIAGNOSIS — N2581 Secondary hyperparathyroidism of renal origin: Secondary | ICD-10-CM | POA: Diagnosis not present

## 2015-10-07 DIAGNOSIS — D509 Iron deficiency anemia, unspecified: Secondary | ICD-10-CM | POA: Diagnosis not present

## 2015-10-07 DIAGNOSIS — N186 End stage renal disease: Secondary | ICD-10-CM | POA: Diagnosis not present

## 2015-10-09 DIAGNOSIS — N186 End stage renal disease: Secondary | ICD-10-CM | POA: Diagnosis not present

## 2015-10-09 DIAGNOSIS — Z992 Dependence on renal dialysis: Secondary | ICD-10-CM | POA: Diagnosis not present

## 2015-10-09 DIAGNOSIS — N2581 Secondary hyperparathyroidism of renal origin: Secondary | ICD-10-CM | POA: Diagnosis not present

## 2015-10-09 DIAGNOSIS — D631 Anemia in chronic kidney disease: Secondary | ICD-10-CM | POA: Diagnosis not present

## 2015-10-09 DIAGNOSIS — D509 Iron deficiency anemia, unspecified: Secondary | ICD-10-CM | POA: Diagnosis not present

## 2015-10-11 DIAGNOSIS — N2581 Secondary hyperparathyroidism of renal origin: Secondary | ICD-10-CM | POA: Diagnosis not present

## 2015-10-11 DIAGNOSIS — D631 Anemia in chronic kidney disease: Secondary | ICD-10-CM | POA: Diagnosis not present

## 2015-10-11 DIAGNOSIS — D509 Iron deficiency anemia, unspecified: Secondary | ICD-10-CM | POA: Diagnosis not present

## 2015-10-11 DIAGNOSIS — N186 End stage renal disease: Secondary | ICD-10-CM | POA: Diagnosis not present

## 2015-10-11 DIAGNOSIS — Z992 Dependence on renal dialysis: Secondary | ICD-10-CM | POA: Diagnosis not present

## 2015-10-14 DIAGNOSIS — D509 Iron deficiency anemia, unspecified: Secondary | ICD-10-CM | POA: Diagnosis not present

## 2015-10-14 DIAGNOSIS — N2581 Secondary hyperparathyroidism of renal origin: Secondary | ICD-10-CM | POA: Diagnosis not present

## 2015-10-14 DIAGNOSIS — N186 End stage renal disease: Secondary | ICD-10-CM | POA: Diagnosis not present

## 2015-10-14 DIAGNOSIS — Z992 Dependence on renal dialysis: Secondary | ICD-10-CM | POA: Diagnosis not present

## 2015-10-14 DIAGNOSIS — D631 Anemia in chronic kidney disease: Secondary | ICD-10-CM | POA: Diagnosis not present

## 2015-10-15 DIAGNOSIS — Z992 Dependence on renal dialysis: Secondary | ICD-10-CM | POA: Diagnosis not present

## 2015-10-15 DIAGNOSIS — N186 End stage renal disease: Secondary | ICD-10-CM | POA: Diagnosis not present

## 2015-10-15 DIAGNOSIS — I120 Hypertensive chronic kidney disease with stage 5 chronic kidney disease or end stage renal disease: Secondary | ICD-10-CM | POA: Diagnosis not present

## 2015-10-15 DIAGNOSIS — F329 Major depressive disorder, single episode, unspecified: Secondary | ICD-10-CM | POA: Diagnosis not present

## 2015-10-15 DIAGNOSIS — G609 Hereditary and idiopathic neuropathy, unspecified: Secondary | ICD-10-CM | POA: Diagnosis not present

## 2015-10-15 DIAGNOSIS — E669 Obesity, unspecified: Secondary | ICD-10-CM | POA: Diagnosis not present

## 2015-10-16 DIAGNOSIS — D631 Anemia in chronic kidney disease: Secondary | ICD-10-CM | POA: Diagnosis not present

## 2015-10-16 DIAGNOSIS — N186 End stage renal disease: Secondary | ICD-10-CM | POA: Diagnosis not present

## 2015-10-16 DIAGNOSIS — Z992 Dependence on renal dialysis: Secondary | ICD-10-CM | POA: Diagnosis not present

## 2015-10-16 DIAGNOSIS — N2581 Secondary hyperparathyroidism of renal origin: Secondary | ICD-10-CM | POA: Diagnosis not present

## 2015-10-16 DIAGNOSIS — D509 Iron deficiency anemia, unspecified: Secondary | ICD-10-CM | POA: Diagnosis not present

## 2015-10-18 DIAGNOSIS — D631 Anemia in chronic kidney disease: Secondary | ICD-10-CM | POA: Diagnosis not present

## 2015-10-18 DIAGNOSIS — D509 Iron deficiency anemia, unspecified: Secondary | ICD-10-CM | POA: Diagnosis not present

## 2015-10-18 DIAGNOSIS — N2581 Secondary hyperparathyroidism of renal origin: Secondary | ICD-10-CM | POA: Diagnosis not present

## 2015-10-18 DIAGNOSIS — Z992 Dependence on renal dialysis: Secondary | ICD-10-CM | POA: Diagnosis not present

## 2015-10-18 DIAGNOSIS — N186 End stage renal disease: Secondary | ICD-10-CM | POA: Diagnosis not present

## 2015-10-21 DIAGNOSIS — D631 Anemia in chronic kidney disease: Secondary | ICD-10-CM | POA: Diagnosis not present

## 2015-10-21 DIAGNOSIS — Z992 Dependence on renal dialysis: Secondary | ICD-10-CM | POA: Diagnosis not present

## 2015-10-21 DIAGNOSIS — D509 Iron deficiency anemia, unspecified: Secondary | ICD-10-CM | POA: Diagnosis not present

## 2015-10-21 DIAGNOSIS — N186 End stage renal disease: Secondary | ICD-10-CM | POA: Diagnosis not present

## 2015-10-21 DIAGNOSIS — N2581 Secondary hyperparathyroidism of renal origin: Secondary | ICD-10-CM | POA: Diagnosis not present

## 2015-10-23 DIAGNOSIS — D631 Anemia in chronic kidney disease: Secondary | ICD-10-CM | POA: Diagnosis not present

## 2015-10-23 DIAGNOSIS — D509 Iron deficiency anemia, unspecified: Secondary | ICD-10-CM | POA: Diagnosis not present

## 2015-10-23 DIAGNOSIS — N2581 Secondary hyperparathyroidism of renal origin: Secondary | ICD-10-CM | POA: Diagnosis not present

## 2015-10-23 DIAGNOSIS — Z992 Dependence on renal dialysis: Secondary | ICD-10-CM | POA: Diagnosis not present

## 2015-10-23 DIAGNOSIS — N186 End stage renal disease: Secondary | ICD-10-CM | POA: Diagnosis not present

## 2015-10-25 DIAGNOSIS — D631 Anemia in chronic kidney disease: Secondary | ICD-10-CM | POA: Diagnosis not present

## 2015-10-25 DIAGNOSIS — N186 End stage renal disease: Secondary | ICD-10-CM | POA: Diagnosis not present

## 2015-10-25 DIAGNOSIS — D509 Iron deficiency anemia, unspecified: Secondary | ICD-10-CM | POA: Diagnosis not present

## 2015-10-25 DIAGNOSIS — N2581 Secondary hyperparathyroidism of renal origin: Secondary | ICD-10-CM | POA: Diagnosis not present

## 2015-10-25 DIAGNOSIS — Z992 Dependence on renal dialysis: Secondary | ICD-10-CM | POA: Diagnosis not present

## 2015-10-28 DIAGNOSIS — Z992 Dependence on renal dialysis: Secondary | ICD-10-CM | POA: Diagnosis not present

## 2015-10-28 DIAGNOSIS — D509 Iron deficiency anemia, unspecified: Secondary | ICD-10-CM | POA: Diagnosis not present

## 2015-10-28 DIAGNOSIS — N186 End stage renal disease: Secondary | ICD-10-CM | POA: Diagnosis not present

## 2015-10-28 DIAGNOSIS — D631 Anemia in chronic kidney disease: Secondary | ICD-10-CM | POA: Diagnosis not present

## 2015-10-28 DIAGNOSIS — N2581 Secondary hyperparathyroidism of renal origin: Secondary | ICD-10-CM | POA: Diagnosis not present

## 2015-10-30 DIAGNOSIS — D631 Anemia in chronic kidney disease: Secondary | ICD-10-CM | POA: Diagnosis not present

## 2015-10-30 DIAGNOSIS — D509 Iron deficiency anemia, unspecified: Secondary | ICD-10-CM | POA: Diagnosis not present

## 2015-10-30 DIAGNOSIS — N186 End stage renal disease: Secondary | ICD-10-CM | POA: Diagnosis not present

## 2015-10-30 DIAGNOSIS — N2581 Secondary hyperparathyroidism of renal origin: Secondary | ICD-10-CM | POA: Diagnosis not present

## 2015-10-30 DIAGNOSIS — Z992 Dependence on renal dialysis: Secondary | ICD-10-CM | POA: Diagnosis not present

## 2015-11-01 DIAGNOSIS — D509 Iron deficiency anemia, unspecified: Secondary | ICD-10-CM | POA: Diagnosis not present

## 2015-11-01 DIAGNOSIS — Z992 Dependence on renal dialysis: Secondary | ICD-10-CM | POA: Diagnosis not present

## 2015-11-01 DIAGNOSIS — D631 Anemia in chronic kidney disease: Secondary | ICD-10-CM | POA: Diagnosis not present

## 2015-11-01 DIAGNOSIS — N186 End stage renal disease: Secondary | ICD-10-CM | POA: Diagnosis not present

## 2015-11-01 DIAGNOSIS — N2581 Secondary hyperparathyroidism of renal origin: Secondary | ICD-10-CM | POA: Diagnosis not present

## 2015-11-04 DIAGNOSIS — D509 Iron deficiency anemia, unspecified: Secondary | ICD-10-CM | POA: Diagnosis not present

## 2015-11-04 DIAGNOSIS — N186 End stage renal disease: Secondary | ICD-10-CM | POA: Diagnosis not present

## 2015-11-04 DIAGNOSIS — Z992 Dependence on renal dialysis: Secondary | ICD-10-CM | POA: Diagnosis not present

## 2015-11-04 DIAGNOSIS — N2581 Secondary hyperparathyroidism of renal origin: Secondary | ICD-10-CM | POA: Diagnosis not present

## 2015-11-04 DIAGNOSIS — D631 Anemia in chronic kidney disease: Secondary | ICD-10-CM | POA: Diagnosis not present

## 2015-11-05 DIAGNOSIS — Z992 Dependence on renal dialysis: Secondary | ICD-10-CM | POA: Diagnosis not present

## 2015-11-05 DIAGNOSIS — N186 End stage renal disease: Secondary | ICD-10-CM | POA: Diagnosis not present

## 2015-11-06 DIAGNOSIS — Z992 Dependence on renal dialysis: Secondary | ICD-10-CM | POA: Diagnosis not present

## 2015-11-06 DIAGNOSIS — N186 End stage renal disease: Secondary | ICD-10-CM | POA: Diagnosis not present

## 2015-11-06 DIAGNOSIS — N2581 Secondary hyperparathyroidism of renal origin: Secondary | ICD-10-CM | POA: Diagnosis not present

## 2015-11-06 DIAGNOSIS — D509 Iron deficiency anemia, unspecified: Secondary | ICD-10-CM | POA: Diagnosis not present

## 2015-11-07 DIAGNOSIS — Z992 Dependence on renal dialysis: Secondary | ICD-10-CM | POA: Diagnosis not present

## 2015-11-07 DIAGNOSIS — N186 End stage renal disease: Secondary | ICD-10-CM | POA: Diagnosis not present

## 2015-11-07 DIAGNOSIS — D509 Iron deficiency anemia, unspecified: Secondary | ICD-10-CM | POA: Diagnosis not present

## 2015-11-07 DIAGNOSIS — N2581 Secondary hyperparathyroidism of renal origin: Secondary | ICD-10-CM | POA: Diagnosis not present

## 2015-11-11 ENCOUNTER — Encounter: Payer: Self-pay | Admitting: Surgery

## 2015-11-11 DIAGNOSIS — D509 Iron deficiency anemia, unspecified: Secondary | ICD-10-CM | POA: Diagnosis not present

## 2015-11-11 DIAGNOSIS — Z992 Dependence on renal dialysis: Secondary | ICD-10-CM | POA: Diagnosis not present

## 2015-11-11 DIAGNOSIS — N186 End stage renal disease: Secondary | ICD-10-CM | POA: Diagnosis not present

## 2015-11-11 DIAGNOSIS — N2581 Secondary hyperparathyroidism of renal origin: Secondary | ICD-10-CM | POA: Diagnosis not present

## 2015-11-13 DIAGNOSIS — D509 Iron deficiency anemia, unspecified: Secondary | ICD-10-CM | POA: Diagnosis not present

## 2015-11-13 DIAGNOSIS — N2581 Secondary hyperparathyroidism of renal origin: Secondary | ICD-10-CM | POA: Diagnosis not present

## 2015-11-13 DIAGNOSIS — Z992 Dependence on renal dialysis: Secondary | ICD-10-CM | POA: Diagnosis not present

## 2015-11-13 DIAGNOSIS — N186 End stage renal disease: Secondary | ICD-10-CM | POA: Diagnosis not present

## 2015-11-15 DIAGNOSIS — Z992 Dependence on renal dialysis: Secondary | ICD-10-CM | POA: Diagnosis not present

## 2015-11-15 DIAGNOSIS — N186 End stage renal disease: Secondary | ICD-10-CM | POA: Diagnosis not present

## 2015-11-15 DIAGNOSIS — D509 Iron deficiency anemia, unspecified: Secondary | ICD-10-CM | POA: Diagnosis not present

## 2015-11-15 DIAGNOSIS — N2581 Secondary hyperparathyroidism of renal origin: Secondary | ICD-10-CM | POA: Diagnosis not present

## 2015-11-17 ENCOUNTER — Ambulatory Visit (INDEPENDENT_AMBULATORY_CARE_PROVIDER_SITE_OTHER): Payer: Medicare Other | Admitting: Surgery

## 2015-11-17 ENCOUNTER — Encounter: Payer: Self-pay | Admitting: Surgery

## 2015-11-17 ENCOUNTER — Ambulatory Visit (HOSPITAL_COMMUNITY)
Admission: RE | Admit: 2015-11-17 | Discharge: 2015-11-17 | Disposition: A | Discharge status: Home / Self Care | Payer: Medicare Other | Source: Ambulatory Visit | Attending: Surgery | Admitting: Surgery

## 2015-11-17 VITALS — BP 120/80 | HR 89 | Resp 16 | Ht 68.0 in | Wt 214.0 lb

## 2015-11-17 DIAGNOSIS — N186 End stage renal disease: Secondary | ICD-10-CM | POA: Insufficient documentation

## 2015-11-17 DIAGNOSIS — Z4931 Encounter for adequacy testing for hemodialysis: Secondary | ICD-10-CM | POA: Diagnosis not present

## 2015-11-17 DIAGNOSIS — I120 Hypertensive chronic kidney disease with stage 5 chronic kidney disease or end stage renal disease: Secondary | ICD-10-CM | POA: Insufficient documentation

## 2015-11-17 NOTE — Progress Notes (Signed)
Patient name: Robert Hays MRN: [id number] DOB: Jun 28, 1966 Sex: male     Chief Complaint  Patient presents with  . Routine Post Op    S/P  Right  RC AVF  10-03-15   f/u    HISTORY OF PRESENT ILLNESS: The patient is back for follow-up.  He is status post right radiocephalic fistula creation on 10/03/2015.  He reports no symptoms of steal.  He has no pain or swelling.  He is currently getting dialysis through a right-sided catheter.  Past Medical History  Diagnosis Date  . Hypertension   . Peritoneal dialysis status (HCC)   . Neuropathy (HCC)   . Myocardial infarction (HCC)     mild 1/16  . Peripheral vascular disease (HCC)     right leg  . Shortness of breath dyspnea     doe  . Anemia   . Dysrhythmia     "skipped beats"  . Sleep apnea     does not have cpap yet  . Asthma   . Hypothyroidism   . Depression     pt denies  . Renal disorder     on dialysis T, TH, Sat  . Umbilical hernia     Past Surgical History  Procedure Laterality Date  . Hernia repair    . Insertion of dialysis catheter      peritoneal  . Insertion of dialysis catheter      right chest  . Removal of a dialysis catheter N/A 09/12/2015    Procedure: REMOVAL OF A DIALYSIS CATHETER;  Surgeon: Renford Dills, MD;  Location: [facility] ORS;  Service: Vascular;  Laterality: N/A;  . Av fistula placement Right 10/03/2015    Procedure: Right arm ARTERIOVENOUS  FISTULA CREATION;  Surgeon: Nada Libman, MD;  Location: [facility] OR;  Service: Vascular;  Laterality: Right;    Social History   Social History  . Marital Status: Married    Spouse Name: N/A  . Number of Children: N/A  . Years of Education: N/A   Occupational History  . Not on file.   Social History Main Topics  . Smoking status: Never Smoker   . Smokeless tobacco: Never Used  . Alcohol Use: No  . Drug Use: No  . Sexual Activity: Not on file   Other Topics Concern  . Not on file   Social History Narrative    Family History    Problem Relation Age of Onset  . Hypertension Mother   . Kidney disease Mother   . Diabetes Father     Allergies as of 11/17/2015 - Review Complete 11/17/2015  Allergen Reaction Noted  . Allegra [fexofenadine] Other (See Comments) 08/05/2015  . Diphenhydramine Other (See Comments) 08/10/2015  . Eggs or egg-derived products Nausea And Vomiting 08/05/2015    Current Outpatient Prescriptions on File Prior to Visit  Medication Sig Dispense Refill  . amLODipine (NORVASC) 5 MG tablet Take by mouth daily. am    . DULoxetine (CYMBALTA) 30 MG capsule Take 30 mg by mouth. pm    . folic acid (FOLVITE) 1 MG tablet Take 1 mg by mouth daily.    Marland Kitchen FOSRENOL 1000 MG chewable tablet Chew 1,000 mg by mouth 4 (four) times daily.  with snacks    . gabapentin (NEURONTIN) 300 MG capsule Take 300 mg by mouth 2 (two) times daily.    Marland Kitchen levothyroxine (SYNTHROID, LEVOTHROID) 25 MCG tablet Take 25 mcg by mouth daily before breakfast.    . loratadine (CLARITIN) 10  MG tablet Take 1 tablet (10 mg total) by mouth daily. 30 tablet 0  . TOPROL XL 25 MG 24 hr tablet Take 25 mg by mouth daily.    Marland Kitchen. oxyCODONE (ROXICODONE) 5 MG immediate release tablet Take 1 tablet (5 mg total) by mouth every 6 (six) hours as needed. (Patient not taking: Reported on 11/17/2015) 20 tablet 0   No current facility-administered medications on file prior to visit.       PHYSICAL EXAMINATION:   Vital signs are  Filed Vitals:   11/17/15 1137 11/17/15 1141  BP: 145/79 120/80  Pulse: 89   Resp: 16   Height: 5\' 8"  (1.727 m)   Weight: 214 lb (97.07 kg)   SpO2: 91%    Body mass index is 32.55 kg/(m^2). General: The patient appears their stated age. Excellent thrill within right radiocephalic fistula which is easy to palpate   Diagnostic Studies Fistula ultrasound has been reviewed.  Vein diameters range from 0.48-0.65.  All depths are less than 4 mm.  The patient does have several branches within the cephalic vein  fistula  Assessment: End-stage renal disease Plan: There is an excellent thrill within the fistula.  It is easy to palpate.  I suspect that it will be ready for use at the 3 month mark which will be January 28.  Should he have any difficulty with dialysis through his fistula, the next step would be branch ligation.  I do not think that this will be an issue and therefore I'm not doing this currently.  I'll above-knee on an as-needed basis  Robert Hays, M.D. [facility] [place] Office: [phone] Pager:  [phone]

## 2015-11-17 NOTE — Progress Notes (Signed)
Filed Vitals:   11/17/15 1137 11/17/15 1141  BP: 145/79 120/80  Pulse: 89   Resp: 16   Height: 5\' 8"  (1.727 m)   Weight: 214 lb (97.07 kg)   SpO2: 91%

## 2015-11-18 DIAGNOSIS — N2581 Secondary hyperparathyroidism of renal origin: Secondary | ICD-10-CM | POA: Diagnosis not present

## 2015-11-18 DIAGNOSIS — Z992 Dependence on renal dialysis: Secondary | ICD-10-CM | POA: Diagnosis not present

## 2015-11-18 DIAGNOSIS — D509 Iron deficiency anemia, unspecified: Secondary | ICD-10-CM | POA: Diagnosis not present

## 2015-11-18 DIAGNOSIS — N186 End stage renal disease: Secondary | ICD-10-CM | POA: Diagnosis not present

## 2015-11-20 DIAGNOSIS — D509 Iron deficiency anemia, unspecified: Secondary | ICD-10-CM | POA: Diagnosis not present

## 2015-11-20 DIAGNOSIS — Z992 Dependence on renal dialysis: Secondary | ICD-10-CM | POA: Diagnosis not present

## 2015-11-20 DIAGNOSIS — N186 End stage renal disease: Secondary | ICD-10-CM | POA: Diagnosis not present

## 2015-11-20 DIAGNOSIS — N2581 Secondary hyperparathyroidism of renal origin: Secondary | ICD-10-CM | POA: Diagnosis not present

## 2015-11-22 DIAGNOSIS — N186 End stage renal disease: Secondary | ICD-10-CM | POA: Diagnosis not present

## 2015-11-22 DIAGNOSIS — Z992 Dependence on renal dialysis: Secondary | ICD-10-CM | POA: Diagnosis not present

## 2015-11-22 DIAGNOSIS — N2581 Secondary hyperparathyroidism of renal origin: Secondary | ICD-10-CM | POA: Diagnosis not present

## 2015-11-22 DIAGNOSIS — D509 Iron deficiency anemia, unspecified: Secondary | ICD-10-CM | POA: Diagnosis not present

## 2015-11-25 DIAGNOSIS — D509 Iron deficiency anemia, unspecified: Secondary | ICD-10-CM | POA: Diagnosis not present

## 2015-11-25 DIAGNOSIS — Z992 Dependence on renal dialysis: Secondary | ICD-10-CM | POA: Diagnosis not present

## 2015-11-25 DIAGNOSIS — N186 End stage renal disease: Secondary | ICD-10-CM | POA: Diagnosis not present

## 2015-11-25 DIAGNOSIS — N2581 Secondary hyperparathyroidism of renal origin: Secondary | ICD-10-CM | POA: Diagnosis not present

## 2015-11-27 DIAGNOSIS — N186 End stage renal disease: Secondary | ICD-10-CM | POA: Diagnosis not present

## 2015-11-27 DIAGNOSIS — D509 Iron deficiency anemia, unspecified: Secondary | ICD-10-CM | POA: Diagnosis not present

## 2015-11-27 DIAGNOSIS — Z992 Dependence on renal dialysis: Secondary | ICD-10-CM | POA: Diagnosis not present

## 2015-11-27 DIAGNOSIS — N2581 Secondary hyperparathyroidism of renal origin: Secondary | ICD-10-CM | POA: Diagnosis not present

## 2015-11-29 DIAGNOSIS — D509 Iron deficiency anemia, unspecified: Secondary | ICD-10-CM | POA: Diagnosis not present

## 2015-11-29 DIAGNOSIS — Z992 Dependence on renal dialysis: Secondary | ICD-10-CM | POA: Diagnosis not present

## 2015-11-29 DIAGNOSIS — N2581 Secondary hyperparathyroidism of renal origin: Secondary | ICD-10-CM | POA: Diagnosis not present

## 2015-11-29 DIAGNOSIS — N186 End stage renal disease: Secondary | ICD-10-CM | POA: Diagnosis not present

## 2015-12-02 DIAGNOSIS — N2581 Secondary hyperparathyroidism of renal origin: Secondary | ICD-10-CM | POA: Diagnosis not present

## 2015-12-02 DIAGNOSIS — D509 Iron deficiency anemia, unspecified: Secondary | ICD-10-CM | POA: Diagnosis not present

## 2015-12-02 DIAGNOSIS — Z992 Dependence on renal dialysis: Secondary | ICD-10-CM | POA: Diagnosis not present

## 2015-12-02 DIAGNOSIS — N186 End stage renal disease: Secondary | ICD-10-CM | POA: Diagnosis not present

## 2015-12-04 DIAGNOSIS — N186 End stage renal disease: Secondary | ICD-10-CM | POA: Diagnosis not present

## 2015-12-04 DIAGNOSIS — N2581 Secondary hyperparathyroidism of renal origin: Secondary | ICD-10-CM | POA: Diagnosis not present

## 2015-12-04 DIAGNOSIS — D509 Iron deficiency anemia, unspecified: Secondary | ICD-10-CM | POA: Diagnosis not present

## 2015-12-04 DIAGNOSIS — Z992 Dependence on renal dialysis: Secondary | ICD-10-CM | POA: Diagnosis not present

## 2015-12-06 DIAGNOSIS — Z992 Dependence on renal dialysis: Secondary | ICD-10-CM | POA: Diagnosis not present

## 2015-12-06 DIAGNOSIS — D509 Iron deficiency anemia, unspecified: Secondary | ICD-10-CM | POA: Diagnosis not present

## 2015-12-06 DIAGNOSIS — N2581 Secondary hyperparathyroidism of renal origin: Secondary | ICD-10-CM | POA: Diagnosis not present

## 2015-12-06 DIAGNOSIS — N186 End stage renal disease: Secondary | ICD-10-CM | POA: Diagnosis not present

## 2015-12-09 DIAGNOSIS — N2581 Secondary hyperparathyroidism of renal origin: Secondary | ICD-10-CM | POA: Diagnosis not present

## 2015-12-09 DIAGNOSIS — D509 Iron deficiency anemia, unspecified: Secondary | ICD-10-CM | POA: Diagnosis not present

## 2015-12-09 DIAGNOSIS — Z992 Dependence on renal dialysis: Secondary | ICD-10-CM | POA: Diagnosis not present

## 2015-12-09 DIAGNOSIS — N186 End stage renal disease: Secondary | ICD-10-CM | POA: Diagnosis not present

## 2015-12-11 DIAGNOSIS — D509 Iron deficiency anemia, unspecified: Secondary | ICD-10-CM | POA: Diagnosis not present

## 2015-12-11 DIAGNOSIS — N2581 Secondary hyperparathyroidism of renal origin: Secondary | ICD-10-CM | POA: Diagnosis not present

## 2015-12-11 DIAGNOSIS — Z992 Dependence on renal dialysis: Secondary | ICD-10-CM | POA: Diagnosis not present

## 2015-12-11 DIAGNOSIS — N186 End stage renal disease: Secondary | ICD-10-CM | POA: Diagnosis not present

## 2015-12-13 DIAGNOSIS — N2581 Secondary hyperparathyroidism of renal origin: Secondary | ICD-10-CM | POA: Diagnosis not present

## 2015-12-13 DIAGNOSIS — D509 Iron deficiency anemia, unspecified: Secondary | ICD-10-CM | POA: Diagnosis not present

## 2015-12-13 DIAGNOSIS — Z992 Dependence on renal dialysis: Secondary | ICD-10-CM | POA: Diagnosis not present

## 2015-12-13 DIAGNOSIS — N186 End stage renal disease: Secondary | ICD-10-CM | POA: Diagnosis not present

## 2015-12-16 DIAGNOSIS — N186 End stage renal disease: Secondary | ICD-10-CM | POA: Diagnosis not present

## 2015-12-16 DIAGNOSIS — N2581 Secondary hyperparathyroidism of renal origin: Secondary | ICD-10-CM | POA: Diagnosis not present

## 2015-12-16 DIAGNOSIS — Z992 Dependence on renal dialysis: Secondary | ICD-10-CM | POA: Diagnosis not present

## 2015-12-16 DIAGNOSIS — D509 Iron deficiency anemia, unspecified: Secondary | ICD-10-CM | POA: Diagnosis not present

## 2015-12-18 DIAGNOSIS — I871 Compression of vein: Secondary | ICD-10-CM | POA: Diagnosis not present

## 2015-12-18 DIAGNOSIS — N186 End stage renal disease: Secondary | ICD-10-CM | POA: Diagnosis not present

## 2015-12-18 DIAGNOSIS — Z992 Dependence on renal dialysis: Secondary | ICD-10-CM | POA: Diagnosis not present

## 2015-12-18 DIAGNOSIS — T82858D Stenosis of vascular prosthetic devices, implants and grafts, subsequent encounter: Secondary | ICD-10-CM | POA: Diagnosis not present

## 2015-12-19 DIAGNOSIS — D509 Iron deficiency anemia, unspecified: Secondary | ICD-10-CM | POA: Diagnosis not present

## 2015-12-19 DIAGNOSIS — N186 End stage renal disease: Secondary | ICD-10-CM | POA: Diagnosis not present

## 2015-12-19 DIAGNOSIS — Z992 Dependence on renal dialysis: Secondary | ICD-10-CM | POA: Diagnosis not present

## 2015-12-19 DIAGNOSIS — N2581 Secondary hyperparathyroidism of renal origin: Secondary | ICD-10-CM | POA: Diagnosis not present

## 2015-12-20 DIAGNOSIS — N186 End stage renal disease: Secondary | ICD-10-CM | POA: Diagnosis not present

## 2015-12-20 DIAGNOSIS — Z992 Dependence on renal dialysis: Secondary | ICD-10-CM | POA: Diagnosis not present

## 2015-12-20 DIAGNOSIS — D509 Iron deficiency anemia, unspecified: Secondary | ICD-10-CM | POA: Diagnosis not present

## 2015-12-20 DIAGNOSIS — N2581 Secondary hyperparathyroidism of renal origin: Secondary | ICD-10-CM | POA: Diagnosis not present

## 2015-12-23 DIAGNOSIS — N186 End stage renal disease: Secondary | ICD-10-CM | POA: Diagnosis not present

## 2015-12-23 DIAGNOSIS — N2581 Secondary hyperparathyroidism of renal origin: Secondary | ICD-10-CM | POA: Diagnosis not present

## 2015-12-23 DIAGNOSIS — Z992 Dependence on renal dialysis: Secondary | ICD-10-CM | POA: Diagnosis not present

## 2015-12-23 DIAGNOSIS — D509 Iron deficiency anemia, unspecified: Secondary | ICD-10-CM | POA: Diagnosis not present

## 2015-12-25 DIAGNOSIS — Z992 Dependence on renal dialysis: Secondary | ICD-10-CM | POA: Diagnosis not present

## 2015-12-25 DIAGNOSIS — N2581 Secondary hyperparathyroidism of renal origin: Secondary | ICD-10-CM | POA: Diagnosis not present

## 2015-12-25 DIAGNOSIS — N186 End stage renal disease: Secondary | ICD-10-CM | POA: Diagnosis not present

## 2015-12-25 DIAGNOSIS — D509 Iron deficiency anemia, unspecified: Secondary | ICD-10-CM | POA: Diagnosis not present

## 2015-12-27 DIAGNOSIS — N2581 Secondary hyperparathyroidism of renal origin: Secondary | ICD-10-CM | POA: Diagnosis not present

## 2015-12-27 DIAGNOSIS — D509 Iron deficiency anemia, unspecified: Secondary | ICD-10-CM | POA: Diagnosis not present

## 2015-12-27 DIAGNOSIS — Z992 Dependence on renal dialysis: Secondary | ICD-10-CM | POA: Diagnosis not present

## 2015-12-27 DIAGNOSIS — N186 End stage renal disease: Secondary | ICD-10-CM | POA: Diagnosis not present

## 2015-12-30 DIAGNOSIS — Z992 Dependence on renal dialysis: Secondary | ICD-10-CM | POA: Diagnosis not present

## 2015-12-30 DIAGNOSIS — N186 End stage renal disease: Secondary | ICD-10-CM | POA: Diagnosis not present

## 2015-12-30 DIAGNOSIS — N2581 Secondary hyperparathyroidism of renal origin: Secondary | ICD-10-CM | POA: Diagnosis not present

## 2015-12-30 DIAGNOSIS — D509 Iron deficiency anemia, unspecified: Secondary | ICD-10-CM | POA: Diagnosis not present

## 2016-01-02 DIAGNOSIS — N2581 Secondary hyperparathyroidism of renal origin: Secondary | ICD-10-CM | POA: Diagnosis not present

## 2016-01-02 DIAGNOSIS — Z992 Dependence on renal dialysis: Secondary | ICD-10-CM | POA: Diagnosis not present

## 2016-01-02 DIAGNOSIS — D509 Iron deficiency anemia, unspecified: Secondary | ICD-10-CM | POA: Diagnosis not present

## 2016-01-02 DIAGNOSIS — N186 End stage renal disease: Secondary | ICD-10-CM | POA: Diagnosis not present

## 2016-01-03 DIAGNOSIS — D509 Iron deficiency anemia, unspecified: Secondary | ICD-10-CM | POA: Diagnosis not present

## 2016-01-03 DIAGNOSIS — Z992 Dependence on renal dialysis: Secondary | ICD-10-CM | POA: Diagnosis not present

## 2016-01-03 DIAGNOSIS — N2581 Secondary hyperparathyroidism of renal origin: Secondary | ICD-10-CM | POA: Diagnosis not present

## 2016-01-03 DIAGNOSIS — N186 End stage renal disease: Secondary | ICD-10-CM | POA: Diagnosis not present

## 2016-01-06 DIAGNOSIS — D509 Iron deficiency anemia, unspecified: Secondary | ICD-10-CM | POA: Diagnosis not present

## 2016-01-06 DIAGNOSIS — N2581 Secondary hyperparathyroidism of renal origin: Secondary | ICD-10-CM | POA: Diagnosis not present

## 2016-01-06 DIAGNOSIS — N186 End stage renal disease: Secondary | ICD-10-CM | POA: Diagnosis not present

## 2016-01-06 DIAGNOSIS — Z992 Dependence on renal dialysis: Secondary | ICD-10-CM | POA: Diagnosis not present

## 2016-01-10 DIAGNOSIS — Z992 Dependence on renal dialysis: Secondary | ICD-10-CM | POA: Diagnosis not present

## 2016-01-10 DIAGNOSIS — N186 End stage renal disease: Secondary | ICD-10-CM | POA: Diagnosis not present

## 2016-01-10 DIAGNOSIS — N2581 Secondary hyperparathyroidism of renal origin: Secondary | ICD-10-CM | POA: Diagnosis not present

## 2016-01-13 DIAGNOSIS — Z992 Dependence on renal dialysis: Secondary | ICD-10-CM | POA: Diagnosis not present

## 2016-01-13 DIAGNOSIS — N186 End stage renal disease: Secondary | ICD-10-CM | POA: Diagnosis not present

## 2016-01-13 DIAGNOSIS — N2581 Secondary hyperparathyroidism of renal origin: Secondary | ICD-10-CM | POA: Diagnosis not present

## 2016-01-15 DIAGNOSIS — N2581 Secondary hyperparathyroidism of renal origin: Secondary | ICD-10-CM | POA: Diagnosis not present

## 2016-01-15 DIAGNOSIS — N186 End stage renal disease: Secondary | ICD-10-CM | POA: Diagnosis not present

## 2016-01-15 DIAGNOSIS — Z992 Dependence on renal dialysis: Secondary | ICD-10-CM | POA: Diagnosis not present

## 2016-01-17 DIAGNOSIS — N2581 Secondary hyperparathyroidism of renal origin: Secondary | ICD-10-CM | POA: Diagnosis not present

## 2016-01-17 DIAGNOSIS — Z992 Dependence on renal dialysis: Secondary | ICD-10-CM | POA: Diagnosis not present

## 2016-01-17 DIAGNOSIS — N186 End stage renal disease: Secondary | ICD-10-CM | POA: Diagnosis not present

## 2016-01-20 DIAGNOSIS — N2581 Secondary hyperparathyroidism of renal origin: Secondary | ICD-10-CM | POA: Diagnosis not present

## 2016-01-20 DIAGNOSIS — N186 End stage renal disease: Secondary | ICD-10-CM | POA: Diagnosis not present

## 2016-01-20 DIAGNOSIS — Z992 Dependence on renal dialysis: Secondary | ICD-10-CM | POA: Diagnosis not present

## 2016-01-22 DIAGNOSIS — Z992 Dependence on renal dialysis: Secondary | ICD-10-CM | POA: Diagnosis not present

## 2016-01-22 DIAGNOSIS — N2581 Secondary hyperparathyroidism of renal origin: Secondary | ICD-10-CM | POA: Diagnosis not present

## 2016-01-22 DIAGNOSIS — N186 End stage renal disease: Secondary | ICD-10-CM | POA: Diagnosis not present

## 2016-01-24 DIAGNOSIS — Z992 Dependence on renal dialysis: Secondary | ICD-10-CM | POA: Diagnosis not present

## 2016-01-24 DIAGNOSIS — N186 End stage renal disease: Secondary | ICD-10-CM | POA: Diagnosis not present

## 2016-01-24 DIAGNOSIS — N2581 Secondary hyperparathyroidism of renal origin: Secondary | ICD-10-CM | POA: Diagnosis not present

## 2016-01-27 DIAGNOSIS — N2581 Secondary hyperparathyroidism of renal origin: Secondary | ICD-10-CM | POA: Diagnosis not present

## 2016-01-27 DIAGNOSIS — N186 End stage renal disease: Secondary | ICD-10-CM | POA: Diagnosis not present

## 2016-01-27 DIAGNOSIS — Z992 Dependence on renal dialysis: Secondary | ICD-10-CM | POA: Diagnosis not present

## 2016-01-29 DIAGNOSIS — Z992 Dependence on renal dialysis: Secondary | ICD-10-CM | POA: Diagnosis not present

## 2016-01-29 DIAGNOSIS — N186 End stage renal disease: Secondary | ICD-10-CM | POA: Diagnosis not present

## 2016-01-29 DIAGNOSIS — N2581 Secondary hyperparathyroidism of renal origin: Secondary | ICD-10-CM | POA: Diagnosis not present

## 2016-01-31 DIAGNOSIS — N2581 Secondary hyperparathyroidism of renal origin: Secondary | ICD-10-CM | POA: Diagnosis not present

## 2016-01-31 DIAGNOSIS — Z992 Dependence on renal dialysis: Secondary | ICD-10-CM | POA: Diagnosis not present

## 2016-01-31 DIAGNOSIS — N186 End stage renal disease: Secondary | ICD-10-CM | POA: Diagnosis not present

## 2016-02-03 DIAGNOSIS — N2581 Secondary hyperparathyroidism of renal origin: Secondary | ICD-10-CM | POA: Diagnosis not present

## 2016-02-03 DIAGNOSIS — N186 End stage renal disease: Secondary | ICD-10-CM | POA: Diagnosis not present

## 2016-02-03 DIAGNOSIS — Z992 Dependence on renal dialysis: Secondary | ICD-10-CM | POA: Diagnosis not present

## 2016-02-05 DIAGNOSIS — N2581 Secondary hyperparathyroidism of renal origin: Secondary | ICD-10-CM | POA: Diagnosis not present

## 2016-02-05 DIAGNOSIS — N186 End stage renal disease: Secondary | ICD-10-CM | POA: Diagnosis not present

## 2016-02-05 DIAGNOSIS — D509 Iron deficiency anemia, unspecified: Secondary | ICD-10-CM | POA: Diagnosis not present

## 2016-02-05 DIAGNOSIS — Z992 Dependence on renal dialysis: Secondary | ICD-10-CM | POA: Diagnosis not present

## 2016-02-07 DIAGNOSIS — D509 Iron deficiency anemia, unspecified: Secondary | ICD-10-CM | POA: Diagnosis not present

## 2016-02-07 DIAGNOSIS — N186 End stage renal disease: Secondary | ICD-10-CM | POA: Diagnosis not present

## 2016-02-07 DIAGNOSIS — N2581 Secondary hyperparathyroidism of renal origin: Secondary | ICD-10-CM | POA: Diagnosis not present

## 2016-02-07 DIAGNOSIS — Z992 Dependence on renal dialysis: Secondary | ICD-10-CM | POA: Diagnosis not present

## 2016-02-10 ENCOUNTER — Telehealth (HOSPITAL_COMMUNITY): Payer: Self-pay

## 2016-02-10 ENCOUNTER — Other Ambulatory Visit (HOSPITAL_COMMUNITY): Payer: Self-pay | Admitting: Nephrology

## 2016-02-10 DIAGNOSIS — Z992 Dependence on renal dialysis: Principal | ICD-10-CM

## 2016-02-10 DIAGNOSIS — N186 End stage renal disease: Secondary | ICD-10-CM

## 2016-02-10 DIAGNOSIS — D509 Iron deficiency anemia, unspecified: Secondary | ICD-10-CM | POA: Diagnosis not present

## 2016-02-10 DIAGNOSIS — N2581 Secondary hyperparathyroidism of renal origin: Secondary | ICD-10-CM | POA: Diagnosis not present

## 2016-02-10 NOTE — Telephone Encounter (Signed)
Called to schedule CVC removal, no answer, no voicemail. Will try back later to schedule. AW

## 2016-02-12 DIAGNOSIS — D509 Iron deficiency anemia, unspecified: Secondary | ICD-10-CM | POA: Diagnosis not present

## 2016-02-12 DIAGNOSIS — N2581 Secondary hyperparathyroidism of renal origin: Secondary | ICD-10-CM | POA: Diagnosis not present

## 2016-02-12 DIAGNOSIS — N186 End stage renal disease: Secondary | ICD-10-CM | POA: Diagnosis not present

## 2016-02-12 DIAGNOSIS — Z992 Dependence on renal dialysis: Secondary | ICD-10-CM | POA: Diagnosis not present

## 2016-02-14 DIAGNOSIS — Z992 Dependence on renal dialysis: Secondary | ICD-10-CM | POA: Diagnosis not present

## 2016-02-14 DIAGNOSIS — N186 End stage renal disease: Secondary | ICD-10-CM | POA: Diagnosis not present

## 2016-02-14 DIAGNOSIS — D509 Iron deficiency anemia, unspecified: Secondary | ICD-10-CM | POA: Diagnosis not present

## 2016-02-14 DIAGNOSIS — N2581 Secondary hyperparathyroidism of renal origin: Secondary | ICD-10-CM | POA: Diagnosis not present

## 2016-02-17 DIAGNOSIS — Z992 Dependence on renal dialysis: Secondary | ICD-10-CM | POA: Diagnosis not present

## 2016-02-17 DIAGNOSIS — D509 Iron deficiency anemia, unspecified: Secondary | ICD-10-CM | POA: Diagnosis not present

## 2016-02-17 DIAGNOSIS — N186 End stage renal disease: Secondary | ICD-10-CM | POA: Diagnosis not present

## 2016-02-17 DIAGNOSIS — N2581 Secondary hyperparathyroidism of renal origin: Secondary | ICD-10-CM | POA: Diagnosis not present

## 2016-02-18 DIAGNOSIS — Z452 Encounter for adjustment and management of vascular access device: Secondary | ICD-10-CM | POA: Diagnosis not present

## 2016-02-19 DIAGNOSIS — N2581 Secondary hyperparathyroidism of renal origin: Secondary | ICD-10-CM | POA: Diagnosis not present

## 2016-02-19 DIAGNOSIS — N186 End stage renal disease: Secondary | ICD-10-CM | POA: Diagnosis not present

## 2016-02-19 DIAGNOSIS — D509 Iron deficiency anemia, unspecified: Secondary | ICD-10-CM | POA: Diagnosis not present

## 2016-02-19 DIAGNOSIS — Z992 Dependence on renal dialysis: Secondary | ICD-10-CM | POA: Diagnosis not present

## 2016-02-21 DIAGNOSIS — Z992 Dependence on renal dialysis: Secondary | ICD-10-CM | POA: Diagnosis not present

## 2016-02-21 DIAGNOSIS — N2581 Secondary hyperparathyroidism of renal origin: Secondary | ICD-10-CM | POA: Diagnosis not present

## 2016-02-21 DIAGNOSIS — D509 Iron deficiency anemia, unspecified: Secondary | ICD-10-CM | POA: Diagnosis not present

## 2016-02-21 DIAGNOSIS — N186 End stage renal disease: Secondary | ICD-10-CM | POA: Diagnosis not present

## 2016-02-24 DIAGNOSIS — N2581 Secondary hyperparathyroidism of renal origin: Secondary | ICD-10-CM | POA: Diagnosis not present

## 2016-02-24 DIAGNOSIS — D509 Iron deficiency anemia, unspecified: Secondary | ICD-10-CM | POA: Diagnosis not present

## 2016-02-24 DIAGNOSIS — N186 End stage renal disease: Secondary | ICD-10-CM | POA: Diagnosis not present

## 2016-02-24 DIAGNOSIS — Z992 Dependence on renal dialysis: Secondary | ICD-10-CM | POA: Diagnosis not present

## 2016-02-26 DIAGNOSIS — D509 Iron deficiency anemia, unspecified: Secondary | ICD-10-CM | POA: Diagnosis not present

## 2016-02-26 DIAGNOSIS — N186 End stage renal disease: Secondary | ICD-10-CM | POA: Diagnosis not present

## 2016-02-26 DIAGNOSIS — N2581 Secondary hyperparathyroidism of renal origin: Secondary | ICD-10-CM | POA: Diagnosis not present

## 2016-02-26 DIAGNOSIS — Z992 Dependence on renal dialysis: Secondary | ICD-10-CM | POA: Diagnosis not present

## 2016-02-27 ENCOUNTER — Ambulatory Visit (HOSPITAL_COMMUNITY): Admission: RE | Admit: 2016-02-27 | Payer: Medicare Other | Source: Ambulatory Visit

## 2016-02-28 DIAGNOSIS — Z992 Dependence on renal dialysis: Secondary | ICD-10-CM | POA: Diagnosis not present

## 2016-02-28 DIAGNOSIS — N186 End stage renal disease: Secondary | ICD-10-CM | POA: Diagnosis not present

## 2016-02-28 DIAGNOSIS — N2581 Secondary hyperparathyroidism of renal origin: Secondary | ICD-10-CM | POA: Diagnosis not present

## 2016-02-28 DIAGNOSIS — D509 Iron deficiency anemia, unspecified: Secondary | ICD-10-CM | POA: Diagnosis not present

## 2016-03-02 DIAGNOSIS — N2581 Secondary hyperparathyroidism of renal origin: Secondary | ICD-10-CM | POA: Diagnosis not present

## 2016-03-02 DIAGNOSIS — D509 Iron deficiency anemia, unspecified: Secondary | ICD-10-CM | POA: Diagnosis not present

## 2016-03-02 DIAGNOSIS — N186 End stage renal disease: Secondary | ICD-10-CM | POA: Diagnosis not present

## 2016-03-02 DIAGNOSIS — Z992 Dependence on renal dialysis: Secondary | ICD-10-CM | POA: Diagnosis not present

## 2016-03-04 DIAGNOSIS — N186 End stage renal disease: Secondary | ICD-10-CM | POA: Diagnosis not present

## 2016-03-04 DIAGNOSIS — Z992 Dependence on renal dialysis: Secondary | ICD-10-CM | POA: Diagnosis not present

## 2016-03-04 DIAGNOSIS — D509 Iron deficiency anemia, unspecified: Secondary | ICD-10-CM | POA: Diagnosis not present

## 2016-03-04 DIAGNOSIS — N2581 Secondary hyperparathyroidism of renal origin: Secondary | ICD-10-CM | POA: Diagnosis not present

## 2016-03-05 DIAGNOSIS — Z992 Dependence on renal dialysis: Secondary | ICD-10-CM | POA: Diagnosis not present

## 2016-03-05 DIAGNOSIS — N186 End stage renal disease: Secondary | ICD-10-CM | POA: Diagnosis not present

## 2016-03-06 DIAGNOSIS — N186 End stage renal disease: Secondary | ICD-10-CM | POA: Diagnosis not present

## 2016-03-06 DIAGNOSIS — Z992 Dependence on renal dialysis: Secondary | ICD-10-CM | POA: Diagnosis not present

## 2016-03-06 DIAGNOSIS — N2581 Secondary hyperparathyroidism of renal origin: Secondary | ICD-10-CM | POA: Diagnosis not present

## 2016-03-06 DIAGNOSIS — Z23 Encounter for immunization: Secondary | ICD-10-CM | POA: Diagnosis not present

## 2016-03-09 DIAGNOSIS — Z23 Encounter for immunization: Secondary | ICD-10-CM | POA: Diagnosis not present

## 2016-03-09 DIAGNOSIS — Z992 Dependence on renal dialysis: Secondary | ICD-10-CM | POA: Diagnosis not present

## 2016-03-09 DIAGNOSIS — N186 End stage renal disease: Secondary | ICD-10-CM | POA: Diagnosis not present

## 2016-03-09 DIAGNOSIS — N2581 Secondary hyperparathyroidism of renal origin: Secondary | ICD-10-CM | POA: Diagnosis not present

## 2016-03-11 DIAGNOSIS — Z23 Encounter for immunization: Secondary | ICD-10-CM | POA: Diagnosis not present

## 2016-03-11 DIAGNOSIS — N2581 Secondary hyperparathyroidism of renal origin: Secondary | ICD-10-CM | POA: Diagnosis not present

## 2016-03-11 DIAGNOSIS — Z992 Dependence on renal dialysis: Secondary | ICD-10-CM | POA: Diagnosis not present

## 2016-03-11 DIAGNOSIS — N186 End stage renal disease: Secondary | ICD-10-CM | POA: Diagnosis not present

## 2016-03-13 DIAGNOSIS — N186 End stage renal disease: Secondary | ICD-10-CM | POA: Diagnosis not present

## 2016-03-13 DIAGNOSIS — N2581 Secondary hyperparathyroidism of renal origin: Secondary | ICD-10-CM | POA: Diagnosis not present

## 2016-03-13 DIAGNOSIS — Z23 Encounter for immunization: Secondary | ICD-10-CM | POA: Diagnosis not present

## 2016-03-13 DIAGNOSIS — Z992 Dependence on renal dialysis: Secondary | ICD-10-CM | POA: Diagnosis not present

## 2016-03-16 DIAGNOSIS — N186 End stage renal disease: Secondary | ICD-10-CM | POA: Diagnosis not present

## 2016-03-16 DIAGNOSIS — N2581 Secondary hyperparathyroidism of renal origin: Secondary | ICD-10-CM | POA: Diagnosis not present

## 2016-03-16 DIAGNOSIS — Z992 Dependence on renal dialysis: Secondary | ICD-10-CM | POA: Diagnosis not present

## 2016-03-16 DIAGNOSIS — Z23 Encounter for immunization: Secondary | ICD-10-CM | POA: Diagnosis not present

## 2016-03-18 DIAGNOSIS — Z992 Dependence on renal dialysis: Secondary | ICD-10-CM | POA: Diagnosis not present

## 2016-03-18 DIAGNOSIS — Z23 Encounter for immunization: Secondary | ICD-10-CM | POA: Diagnosis not present

## 2016-03-18 DIAGNOSIS — N2581 Secondary hyperparathyroidism of renal origin: Secondary | ICD-10-CM | POA: Diagnosis not present

## 2016-03-18 DIAGNOSIS — N186 End stage renal disease: Secondary | ICD-10-CM | POA: Diagnosis not present

## 2016-03-20 DIAGNOSIS — Z992 Dependence on renal dialysis: Secondary | ICD-10-CM | POA: Diagnosis not present

## 2016-03-20 DIAGNOSIS — Z23 Encounter for immunization: Secondary | ICD-10-CM | POA: Diagnosis not present

## 2016-03-20 DIAGNOSIS — N186 End stage renal disease: Secondary | ICD-10-CM | POA: Diagnosis not present

## 2016-03-20 DIAGNOSIS — N2581 Secondary hyperparathyroidism of renal origin: Secondary | ICD-10-CM | POA: Diagnosis not present

## 2016-03-22 DIAGNOSIS — I10 Essential (primary) hypertension: Secondary | ICD-10-CM | POA: Diagnosis not present

## 2016-03-22 DIAGNOSIS — G5711 Meralgia paresthetica, right lower limb: Secondary | ICD-10-CM | POA: Diagnosis not present

## 2016-03-22 DIAGNOSIS — G3184 Mild cognitive impairment, so stated: Secondary | ICD-10-CM | POA: Diagnosis not present

## 2016-03-22 DIAGNOSIS — N289 Disorder of kidney and ureter, unspecified: Secondary | ICD-10-CM | POA: Diagnosis not present

## 2016-03-22 DIAGNOSIS — G603 Idiopathic progressive neuropathy: Secondary | ICD-10-CM | POA: Diagnosis not present

## 2016-03-22 DIAGNOSIS — M1009 Idiopathic gout, multiple sites: Secondary | ICD-10-CM | POA: Diagnosis not present

## 2016-03-22 DIAGNOSIS — G4733 Obstructive sleep apnea (adult) (pediatric): Secondary | ICD-10-CM | POA: Diagnosis not present

## 2016-03-23 DIAGNOSIS — N186 End stage renal disease: Secondary | ICD-10-CM | POA: Diagnosis not present

## 2016-03-23 DIAGNOSIS — Z992 Dependence on renal dialysis: Secondary | ICD-10-CM | POA: Diagnosis not present

## 2016-03-23 DIAGNOSIS — Z23 Encounter for immunization: Secondary | ICD-10-CM | POA: Diagnosis not present

## 2016-03-23 DIAGNOSIS — N2581 Secondary hyperparathyroidism of renal origin: Secondary | ICD-10-CM | POA: Diagnosis not present

## 2016-03-25 DIAGNOSIS — Z992 Dependence on renal dialysis: Secondary | ICD-10-CM | POA: Diagnosis not present

## 2016-03-25 DIAGNOSIS — Z23 Encounter for immunization: Secondary | ICD-10-CM | POA: Diagnosis not present

## 2016-03-25 DIAGNOSIS — N2581 Secondary hyperparathyroidism of renal origin: Secondary | ICD-10-CM | POA: Diagnosis not present

## 2016-03-25 DIAGNOSIS — N186 End stage renal disease: Secondary | ICD-10-CM | POA: Diagnosis not present

## 2016-03-27 DIAGNOSIS — Z992 Dependence on renal dialysis: Secondary | ICD-10-CM | POA: Diagnosis not present

## 2016-03-27 DIAGNOSIS — Z23 Encounter for immunization: Secondary | ICD-10-CM | POA: Diagnosis not present

## 2016-03-27 DIAGNOSIS — N186 End stage renal disease: Secondary | ICD-10-CM | POA: Diagnosis not present

## 2016-03-27 DIAGNOSIS — N2581 Secondary hyperparathyroidism of renal origin: Secondary | ICD-10-CM | POA: Diagnosis not present

## 2016-03-30 DIAGNOSIS — N2581 Secondary hyperparathyroidism of renal origin: Secondary | ICD-10-CM | POA: Diagnosis not present

## 2016-03-30 DIAGNOSIS — Z23 Encounter for immunization: Secondary | ICD-10-CM | POA: Diagnosis not present

## 2016-03-30 DIAGNOSIS — Z992 Dependence on renal dialysis: Secondary | ICD-10-CM | POA: Diagnosis not present

## 2016-03-30 DIAGNOSIS — N186 End stage renal disease: Secondary | ICD-10-CM | POA: Diagnosis not present

## 2016-04-01 DIAGNOSIS — N2581 Secondary hyperparathyroidism of renal origin: Secondary | ICD-10-CM | POA: Diagnosis not present

## 2016-04-01 DIAGNOSIS — N186 End stage renal disease: Secondary | ICD-10-CM | POA: Diagnosis not present

## 2016-04-01 DIAGNOSIS — Z23 Encounter for immunization: Secondary | ICD-10-CM | POA: Diagnosis not present

## 2016-04-01 DIAGNOSIS — Z992 Dependence on renal dialysis: Secondary | ICD-10-CM | POA: Diagnosis not present

## 2016-04-02 DIAGNOSIS — Z992 Dependence on renal dialysis: Secondary | ICD-10-CM | POA: Diagnosis not present

## 2016-04-02 DIAGNOSIS — N186 End stage renal disease: Secondary | ICD-10-CM | POA: Diagnosis not present

## 2016-04-02 DIAGNOSIS — N2581 Secondary hyperparathyroidism of renal origin: Secondary | ICD-10-CM | POA: Diagnosis not present

## 2016-04-02 DIAGNOSIS — Z23 Encounter for immunization: Secondary | ICD-10-CM | POA: Diagnosis not present

## 2016-04-04 DIAGNOSIS — Z992 Dependence on renal dialysis: Secondary | ICD-10-CM | POA: Diagnosis not present

## 2016-04-04 DIAGNOSIS — N186 End stage renal disease: Secondary | ICD-10-CM | POA: Diagnosis not present

## 2016-04-06 DIAGNOSIS — N186 End stage renal disease: Secondary | ICD-10-CM | POA: Diagnosis not present

## 2016-04-06 DIAGNOSIS — Z992 Dependence on renal dialysis: Secondary | ICD-10-CM | POA: Diagnosis not present

## 2016-04-06 DIAGNOSIS — N2581 Secondary hyperparathyroidism of renal origin: Secondary | ICD-10-CM | POA: Diagnosis not present

## 2016-04-08 DIAGNOSIS — Z992 Dependence on renal dialysis: Secondary | ICD-10-CM | POA: Diagnosis not present

## 2016-04-08 DIAGNOSIS — N186 End stage renal disease: Secondary | ICD-10-CM | POA: Diagnosis not present

## 2016-04-08 DIAGNOSIS — N2581 Secondary hyperparathyroidism of renal origin: Secondary | ICD-10-CM | POA: Diagnosis not present

## 2016-04-10 DIAGNOSIS — Z992 Dependence on renal dialysis: Secondary | ICD-10-CM | POA: Diagnosis not present

## 2016-04-10 DIAGNOSIS — N186 End stage renal disease: Secondary | ICD-10-CM | POA: Diagnosis not present

## 2016-04-10 DIAGNOSIS — N2581 Secondary hyperparathyroidism of renal origin: Secondary | ICD-10-CM | POA: Diagnosis not present

## 2016-04-13 DIAGNOSIS — Z992 Dependence on renal dialysis: Secondary | ICD-10-CM | POA: Diagnosis not present

## 2016-04-13 DIAGNOSIS — N186 End stage renal disease: Secondary | ICD-10-CM | POA: Diagnosis not present

## 2016-04-13 DIAGNOSIS — N2581 Secondary hyperparathyroidism of renal origin: Secondary | ICD-10-CM | POA: Diagnosis not present

## 2016-04-15 DIAGNOSIS — N186 End stage renal disease: Secondary | ICD-10-CM | POA: Diagnosis not present

## 2016-04-15 DIAGNOSIS — N2581 Secondary hyperparathyroidism of renal origin: Secondary | ICD-10-CM | POA: Diagnosis not present

## 2016-04-15 DIAGNOSIS — Z992 Dependence on renal dialysis: Secondary | ICD-10-CM | POA: Diagnosis not present

## 2016-04-16 DIAGNOSIS — M7021 Olecranon bursitis, right elbow: Secondary | ICD-10-CM | POA: Diagnosis not present

## 2016-04-16 DIAGNOSIS — H6121 Impacted cerumen, right ear: Secondary | ICD-10-CM | POA: Diagnosis not present

## 2016-04-17 DIAGNOSIS — N186 End stage renal disease: Secondary | ICD-10-CM | POA: Diagnosis not present

## 2016-04-17 DIAGNOSIS — N2581 Secondary hyperparathyroidism of renal origin: Secondary | ICD-10-CM | POA: Diagnosis not present

## 2016-04-17 DIAGNOSIS — Z992 Dependence on renal dialysis: Secondary | ICD-10-CM | POA: Diagnosis not present

## 2016-04-20 DIAGNOSIS — Z992 Dependence on renal dialysis: Secondary | ICD-10-CM | POA: Diagnosis not present

## 2016-04-20 DIAGNOSIS — N186 End stage renal disease: Secondary | ICD-10-CM | POA: Diagnosis not present

## 2016-04-20 DIAGNOSIS — N2581 Secondary hyperparathyroidism of renal origin: Secondary | ICD-10-CM | POA: Diagnosis not present

## 2016-04-20 DIAGNOSIS — M7021 Olecranon bursitis, right elbow: Secondary | ICD-10-CM | POA: Diagnosis not present

## 2016-04-22 DIAGNOSIS — N2581 Secondary hyperparathyroidism of renal origin: Secondary | ICD-10-CM | POA: Diagnosis not present

## 2016-04-22 DIAGNOSIS — Z992 Dependence on renal dialysis: Secondary | ICD-10-CM | POA: Diagnosis not present

## 2016-04-22 DIAGNOSIS — N186 End stage renal disease: Secondary | ICD-10-CM | POA: Diagnosis not present

## 2016-04-24 DIAGNOSIS — Z992 Dependence on renal dialysis: Secondary | ICD-10-CM | POA: Diagnosis not present

## 2016-04-24 DIAGNOSIS — N2581 Secondary hyperparathyroidism of renal origin: Secondary | ICD-10-CM | POA: Diagnosis not present

## 2016-04-24 DIAGNOSIS — N186 End stage renal disease: Secondary | ICD-10-CM | POA: Diagnosis not present

## 2016-04-27 DIAGNOSIS — N2581 Secondary hyperparathyroidism of renal origin: Secondary | ICD-10-CM | POA: Diagnosis not present

## 2016-04-27 DIAGNOSIS — Z992 Dependence on renal dialysis: Secondary | ICD-10-CM | POA: Diagnosis not present

## 2016-04-27 DIAGNOSIS — N186 End stage renal disease: Secondary | ICD-10-CM | POA: Diagnosis not present

## 2016-04-29 DIAGNOSIS — Z992 Dependence on renal dialysis: Secondary | ICD-10-CM | POA: Diagnosis not present

## 2016-04-29 DIAGNOSIS — N2581 Secondary hyperparathyroidism of renal origin: Secondary | ICD-10-CM | POA: Diagnosis not present

## 2016-04-29 DIAGNOSIS — N186 End stage renal disease: Secondary | ICD-10-CM | POA: Diagnosis not present

## 2016-05-01 DIAGNOSIS — N186 End stage renal disease: Secondary | ICD-10-CM | POA: Diagnosis not present

## 2016-05-01 DIAGNOSIS — Z992 Dependence on renal dialysis: Secondary | ICD-10-CM | POA: Diagnosis not present

## 2016-05-01 DIAGNOSIS — N2581 Secondary hyperparathyroidism of renal origin: Secondary | ICD-10-CM | POA: Diagnosis not present

## 2016-05-04 DIAGNOSIS — N2581 Secondary hyperparathyroidism of renal origin: Secondary | ICD-10-CM | POA: Diagnosis not present

## 2016-05-04 DIAGNOSIS — Z992 Dependence on renal dialysis: Secondary | ICD-10-CM | POA: Diagnosis not present

## 2016-05-04 DIAGNOSIS — N186 End stage renal disease: Secondary | ICD-10-CM | POA: Diagnosis not present

## 2016-05-05 DIAGNOSIS — Z992 Dependence on renal dialysis: Secondary | ICD-10-CM | POA: Diagnosis not present

## 2016-05-05 DIAGNOSIS — N186 End stage renal disease: Secondary | ICD-10-CM | POA: Diagnosis not present

## 2016-05-06 DIAGNOSIS — Z992 Dependence on renal dialysis: Secondary | ICD-10-CM | POA: Diagnosis not present

## 2016-05-06 DIAGNOSIS — N2581 Secondary hyperparathyroidism of renal origin: Secondary | ICD-10-CM | POA: Diagnosis not present

## 2016-05-06 DIAGNOSIS — N186 End stage renal disease: Secondary | ICD-10-CM | POA: Diagnosis not present

## 2016-05-06 DIAGNOSIS — D509 Iron deficiency anemia, unspecified: Secondary | ICD-10-CM | POA: Diagnosis not present

## 2016-05-08 DIAGNOSIS — N2581 Secondary hyperparathyroidism of renal origin: Secondary | ICD-10-CM | POA: Diagnosis not present

## 2016-05-08 DIAGNOSIS — D509 Iron deficiency anemia, unspecified: Secondary | ICD-10-CM | POA: Diagnosis not present

## 2016-05-08 DIAGNOSIS — N186 End stage renal disease: Secondary | ICD-10-CM | POA: Diagnosis not present

## 2016-05-08 DIAGNOSIS — Z992 Dependence on renal dialysis: Secondary | ICD-10-CM | POA: Diagnosis not present

## 2016-05-11 DIAGNOSIS — N2581 Secondary hyperparathyroidism of renal origin: Secondary | ICD-10-CM | POA: Diagnosis not present

## 2016-05-11 DIAGNOSIS — Z992 Dependence on renal dialysis: Secondary | ICD-10-CM | POA: Diagnosis not present

## 2016-05-11 DIAGNOSIS — D509 Iron deficiency anemia, unspecified: Secondary | ICD-10-CM | POA: Diagnosis not present

## 2016-05-11 DIAGNOSIS — N186 End stage renal disease: Secondary | ICD-10-CM | POA: Diagnosis not present

## 2016-05-13 DIAGNOSIS — D509 Iron deficiency anemia, unspecified: Secondary | ICD-10-CM | POA: Diagnosis not present

## 2016-05-13 DIAGNOSIS — N186 End stage renal disease: Secondary | ICD-10-CM | POA: Diagnosis not present

## 2016-05-13 DIAGNOSIS — Z992 Dependence on renal dialysis: Secondary | ICD-10-CM | POA: Diagnosis not present

## 2016-05-13 DIAGNOSIS — N2581 Secondary hyperparathyroidism of renal origin: Secondary | ICD-10-CM | POA: Diagnosis not present

## 2016-05-15 DIAGNOSIS — N2581 Secondary hyperparathyroidism of renal origin: Secondary | ICD-10-CM | POA: Diagnosis not present

## 2016-05-15 DIAGNOSIS — D509 Iron deficiency anemia, unspecified: Secondary | ICD-10-CM | POA: Diagnosis not present

## 2016-05-15 DIAGNOSIS — N186 End stage renal disease: Secondary | ICD-10-CM | POA: Diagnosis not present

## 2016-05-15 DIAGNOSIS — Z992 Dependence on renal dialysis: Secondary | ICD-10-CM | POA: Diagnosis not present

## 2016-05-18 DIAGNOSIS — N2581 Secondary hyperparathyroidism of renal origin: Secondary | ICD-10-CM | POA: Diagnosis not present

## 2016-05-18 DIAGNOSIS — N186 End stage renal disease: Secondary | ICD-10-CM | POA: Diagnosis not present

## 2016-05-18 DIAGNOSIS — D509 Iron deficiency anemia, unspecified: Secondary | ICD-10-CM | POA: Diagnosis not present

## 2016-05-18 DIAGNOSIS — Z992 Dependence on renal dialysis: Secondary | ICD-10-CM | POA: Diagnosis not present

## 2016-05-20 DIAGNOSIS — N186 End stage renal disease: Secondary | ICD-10-CM | POA: Diagnosis not present

## 2016-05-20 DIAGNOSIS — N2581 Secondary hyperparathyroidism of renal origin: Secondary | ICD-10-CM | POA: Diagnosis not present

## 2016-05-20 DIAGNOSIS — D509 Iron deficiency anemia, unspecified: Secondary | ICD-10-CM | POA: Diagnosis not present

## 2016-05-20 DIAGNOSIS — Z992 Dependence on renal dialysis: Secondary | ICD-10-CM | POA: Diagnosis not present

## 2016-05-22 DIAGNOSIS — N2581 Secondary hyperparathyroidism of renal origin: Secondary | ICD-10-CM | POA: Diagnosis not present

## 2016-05-22 DIAGNOSIS — D509 Iron deficiency anemia, unspecified: Secondary | ICD-10-CM | POA: Diagnosis not present

## 2016-05-22 DIAGNOSIS — N186 End stage renal disease: Secondary | ICD-10-CM | POA: Diagnosis not present

## 2016-05-22 DIAGNOSIS — Z992 Dependence on renal dialysis: Secondary | ICD-10-CM | POA: Diagnosis not present

## 2016-05-25 DIAGNOSIS — D509 Iron deficiency anemia, unspecified: Secondary | ICD-10-CM | POA: Diagnosis not present

## 2016-05-25 DIAGNOSIS — Z992 Dependence on renal dialysis: Secondary | ICD-10-CM | POA: Diagnosis not present

## 2016-05-25 DIAGNOSIS — N2581 Secondary hyperparathyroidism of renal origin: Secondary | ICD-10-CM | POA: Diagnosis not present

## 2016-05-25 DIAGNOSIS — N186 End stage renal disease: Secondary | ICD-10-CM | POA: Diagnosis not present

## 2016-05-27 DIAGNOSIS — Z992 Dependence on renal dialysis: Secondary | ICD-10-CM | POA: Diagnosis not present

## 2016-05-27 DIAGNOSIS — N186 End stage renal disease: Secondary | ICD-10-CM | POA: Diagnosis not present

## 2016-05-27 DIAGNOSIS — D509 Iron deficiency anemia, unspecified: Secondary | ICD-10-CM | POA: Diagnosis not present

## 2016-05-27 DIAGNOSIS — N2581 Secondary hyperparathyroidism of renal origin: Secondary | ICD-10-CM | POA: Diagnosis not present

## 2016-05-29 DIAGNOSIS — N186 End stage renal disease: Secondary | ICD-10-CM | POA: Diagnosis not present

## 2016-05-29 DIAGNOSIS — Z992 Dependence on renal dialysis: Secondary | ICD-10-CM | POA: Diagnosis not present

## 2016-05-29 DIAGNOSIS — D509 Iron deficiency anemia, unspecified: Secondary | ICD-10-CM | POA: Diagnosis not present

## 2016-05-29 DIAGNOSIS — N2581 Secondary hyperparathyroidism of renal origin: Secondary | ICD-10-CM | POA: Diagnosis not present

## 2016-06-01 DIAGNOSIS — N2581 Secondary hyperparathyroidism of renal origin: Secondary | ICD-10-CM | POA: Diagnosis not present

## 2016-06-01 DIAGNOSIS — N186 End stage renal disease: Secondary | ICD-10-CM | POA: Diagnosis not present

## 2016-06-01 DIAGNOSIS — Z992 Dependence on renal dialysis: Secondary | ICD-10-CM | POA: Diagnosis not present

## 2016-06-01 DIAGNOSIS — D509 Iron deficiency anemia, unspecified: Secondary | ICD-10-CM | POA: Diagnosis not present

## 2016-06-03 DIAGNOSIS — N186 End stage renal disease: Secondary | ICD-10-CM | POA: Diagnosis not present

## 2016-06-03 DIAGNOSIS — D509 Iron deficiency anemia, unspecified: Secondary | ICD-10-CM | POA: Diagnosis not present

## 2016-06-03 DIAGNOSIS — N2581 Secondary hyperparathyroidism of renal origin: Secondary | ICD-10-CM | POA: Diagnosis not present

## 2016-06-03 DIAGNOSIS — Z992 Dependence on renal dialysis: Secondary | ICD-10-CM | POA: Diagnosis not present

## 2016-06-04 DIAGNOSIS — Z992 Dependence on renal dialysis: Secondary | ICD-10-CM | POA: Diagnosis not present

## 2016-06-04 DIAGNOSIS — N186 End stage renal disease: Secondary | ICD-10-CM | POA: Diagnosis not present

## 2016-06-05 DIAGNOSIS — N2581 Secondary hyperparathyroidism of renal origin: Secondary | ICD-10-CM | POA: Diagnosis not present

## 2016-06-05 DIAGNOSIS — N186 End stage renal disease: Secondary | ICD-10-CM | POA: Diagnosis not present

## 2016-06-05 DIAGNOSIS — D509 Iron deficiency anemia, unspecified: Secondary | ICD-10-CM | POA: Diagnosis not present

## 2016-06-05 DIAGNOSIS — Z992 Dependence on renal dialysis: Secondary | ICD-10-CM | POA: Diagnosis not present

## 2016-06-08 DIAGNOSIS — D509 Iron deficiency anemia, unspecified: Secondary | ICD-10-CM | POA: Diagnosis not present

## 2016-06-08 DIAGNOSIS — Z992 Dependence on renal dialysis: Secondary | ICD-10-CM | POA: Diagnosis not present

## 2016-06-08 DIAGNOSIS — N186 End stage renal disease: Secondary | ICD-10-CM | POA: Diagnosis not present

## 2016-06-08 DIAGNOSIS — N2581 Secondary hyperparathyroidism of renal origin: Secondary | ICD-10-CM | POA: Diagnosis not present

## 2016-06-10 DIAGNOSIS — N2581 Secondary hyperparathyroidism of renal origin: Secondary | ICD-10-CM | POA: Diagnosis not present

## 2016-06-10 DIAGNOSIS — N186 End stage renal disease: Secondary | ICD-10-CM | POA: Diagnosis not present

## 2016-06-10 DIAGNOSIS — Z992 Dependence on renal dialysis: Secondary | ICD-10-CM | POA: Diagnosis not present

## 2016-06-10 DIAGNOSIS — D509 Iron deficiency anemia, unspecified: Secondary | ICD-10-CM | POA: Diagnosis not present

## 2016-06-12 DIAGNOSIS — Z992 Dependence on renal dialysis: Secondary | ICD-10-CM | POA: Diagnosis not present

## 2016-06-12 DIAGNOSIS — N186 End stage renal disease: Secondary | ICD-10-CM | POA: Diagnosis not present

## 2016-06-12 DIAGNOSIS — N2581 Secondary hyperparathyroidism of renal origin: Secondary | ICD-10-CM | POA: Diagnosis not present

## 2016-06-12 DIAGNOSIS — D509 Iron deficiency anemia, unspecified: Secondary | ICD-10-CM | POA: Diagnosis not present

## 2016-06-15 DIAGNOSIS — N2581 Secondary hyperparathyroidism of renal origin: Secondary | ICD-10-CM | POA: Diagnosis not present

## 2016-06-15 DIAGNOSIS — Z992 Dependence on renal dialysis: Secondary | ICD-10-CM | POA: Diagnosis not present

## 2016-06-15 DIAGNOSIS — N186 End stage renal disease: Secondary | ICD-10-CM | POA: Diagnosis not present

## 2016-06-15 DIAGNOSIS — D509 Iron deficiency anemia, unspecified: Secondary | ICD-10-CM | POA: Diagnosis not present

## 2016-06-17 DIAGNOSIS — N186 End stage renal disease: Secondary | ICD-10-CM | POA: Diagnosis not present

## 2016-06-17 DIAGNOSIS — Z992 Dependence on renal dialysis: Secondary | ICD-10-CM | POA: Diagnosis not present

## 2016-06-17 DIAGNOSIS — N2581 Secondary hyperparathyroidism of renal origin: Secondary | ICD-10-CM | POA: Diagnosis not present

## 2016-06-17 DIAGNOSIS — D509 Iron deficiency anemia, unspecified: Secondary | ICD-10-CM | POA: Diagnosis not present

## 2016-06-19 DIAGNOSIS — Z992 Dependence on renal dialysis: Secondary | ICD-10-CM | POA: Diagnosis not present

## 2016-06-19 DIAGNOSIS — N2581 Secondary hyperparathyroidism of renal origin: Secondary | ICD-10-CM | POA: Diagnosis not present

## 2016-06-19 DIAGNOSIS — D509 Iron deficiency anemia, unspecified: Secondary | ICD-10-CM | POA: Diagnosis not present

## 2016-06-19 DIAGNOSIS — N186 End stage renal disease: Secondary | ICD-10-CM | POA: Diagnosis not present

## 2016-06-22 DIAGNOSIS — N186 End stage renal disease: Secondary | ICD-10-CM | POA: Diagnosis not present

## 2016-06-22 DIAGNOSIS — N2581 Secondary hyperparathyroidism of renal origin: Secondary | ICD-10-CM | POA: Diagnosis not present

## 2016-06-22 DIAGNOSIS — D509 Iron deficiency anemia, unspecified: Secondary | ICD-10-CM | POA: Diagnosis not present

## 2016-06-22 DIAGNOSIS — Z992 Dependence on renal dialysis: Secondary | ICD-10-CM | POA: Diagnosis not present

## 2016-06-24 DIAGNOSIS — Z992 Dependence on renal dialysis: Secondary | ICD-10-CM | POA: Diagnosis not present

## 2016-06-24 DIAGNOSIS — D509 Iron deficiency anemia, unspecified: Secondary | ICD-10-CM | POA: Diagnosis not present

## 2016-06-24 DIAGNOSIS — N186 End stage renal disease: Secondary | ICD-10-CM | POA: Diagnosis not present

## 2016-06-24 DIAGNOSIS — N2581 Secondary hyperparathyroidism of renal origin: Secondary | ICD-10-CM | POA: Diagnosis not present

## 2016-06-26 DIAGNOSIS — D509 Iron deficiency anemia, unspecified: Secondary | ICD-10-CM | POA: Diagnosis not present

## 2016-06-26 DIAGNOSIS — N2581 Secondary hyperparathyroidism of renal origin: Secondary | ICD-10-CM | POA: Diagnosis not present

## 2016-06-26 DIAGNOSIS — N186 End stage renal disease: Secondary | ICD-10-CM | POA: Diagnosis not present

## 2016-06-26 DIAGNOSIS — Z992 Dependence on renal dialysis: Secondary | ICD-10-CM | POA: Diagnosis not present

## 2016-06-29 DIAGNOSIS — D509 Iron deficiency anemia, unspecified: Secondary | ICD-10-CM | POA: Diagnosis not present

## 2016-06-29 DIAGNOSIS — N2581 Secondary hyperparathyroidism of renal origin: Secondary | ICD-10-CM | POA: Diagnosis not present

## 2016-06-29 DIAGNOSIS — Z992 Dependence on renal dialysis: Secondary | ICD-10-CM | POA: Diagnosis not present

## 2016-06-29 DIAGNOSIS — N186 End stage renal disease: Secondary | ICD-10-CM | POA: Diagnosis not present

## 2016-07-01 DIAGNOSIS — N2581 Secondary hyperparathyroidism of renal origin: Secondary | ICD-10-CM | POA: Diagnosis not present

## 2016-07-01 DIAGNOSIS — Z992 Dependence on renal dialysis: Secondary | ICD-10-CM | POA: Diagnosis not present

## 2016-07-01 DIAGNOSIS — N186 End stage renal disease: Secondary | ICD-10-CM | POA: Diagnosis not present

## 2016-07-01 DIAGNOSIS — D509 Iron deficiency anemia, unspecified: Secondary | ICD-10-CM | POA: Diagnosis not present

## 2016-07-03 DIAGNOSIS — N2581 Secondary hyperparathyroidism of renal origin: Secondary | ICD-10-CM | POA: Diagnosis not present

## 2016-07-03 DIAGNOSIS — Z992 Dependence on renal dialysis: Secondary | ICD-10-CM | POA: Diagnosis not present

## 2016-07-03 DIAGNOSIS — N186 End stage renal disease: Secondary | ICD-10-CM | POA: Diagnosis not present

## 2016-07-03 DIAGNOSIS — D509 Iron deficiency anemia, unspecified: Secondary | ICD-10-CM | POA: Diagnosis not present

## 2016-07-05 DIAGNOSIS — Z992 Dependence on renal dialysis: Secondary | ICD-10-CM | POA: Diagnosis not present

## 2016-07-05 DIAGNOSIS — N186 End stage renal disease: Secondary | ICD-10-CM | POA: Diagnosis not present

## 2016-07-06 DIAGNOSIS — D509 Iron deficiency anemia, unspecified: Secondary | ICD-10-CM | POA: Diagnosis not present

## 2016-07-06 DIAGNOSIS — Z992 Dependence on renal dialysis: Secondary | ICD-10-CM | POA: Diagnosis not present

## 2016-07-06 DIAGNOSIS — N2581 Secondary hyperparathyroidism of renal origin: Secondary | ICD-10-CM | POA: Diagnosis not present

## 2016-07-06 DIAGNOSIS — N186 End stage renal disease: Secondary | ICD-10-CM | POA: Diagnosis not present

## 2016-07-08 DIAGNOSIS — D509 Iron deficiency anemia, unspecified: Secondary | ICD-10-CM | POA: Diagnosis not present

## 2016-07-08 DIAGNOSIS — N186 End stage renal disease: Secondary | ICD-10-CM | POA: Diagnosis not present

## 2016-07-08 DIAGNOSIS — N2581 Secondary hyperparathyroidism of renal origin: Secondary | ICD-10-CM | POA: Diagnosis not present

## 2016-07-08 DIAGNOSIS — Z992 Dependence on renal dialysis: Secondary | ICD-10-CM | POA: Diagnosis not present

## 2016-07-10 DIAGNOSIS — Z992 Dependence on renal dialysis: Secondary | ICD-10-CM | POA: Diagnosis not present

## 2016-07-10 DIAGNOSIS — D509 Iron deficiency anemia, unspecified: Secondary | ICD-10-CM | POA: Diagnosis not present

## 2016-07-10 DIAGNOSIS — N2581 Secondary hyperparathyroidism of renal origin: Secondary | ICD-10-CM | POA: Diagnosis not present

## 2016-07-10 DIAGNOSIS — N186 End stage renal disease: Secondary | ICD-10-CM | POA: Diagnosis not present

## 2016-07-13 DIAGNOSIS — Z992 Dependence on renal dialysis: Secondary | ICD-10-CM | POA: Diagnosis not present

## 2016-07-13 DIAGNOSIS — D509 Iron deficiency anemia, unspecified: Secondary | ICD-10-CM | POA: Diagnosis not present

## 2016-07-13 DIAGNOSIS — N2581 Secondary hyperparathyroidism of renal origin: Secondary | ICD-10-CM | POA: Diagnosis not present

## 2016-07-13 DIAGNOSIS — N186 End stage renal disease: Secondary | ICD-10-CM | POA: Diagnosis not present

## 2016-07-15 DIAGNOSIS — N186 End stage renal disease: Secondary | ICD-10-CM | POA: Diagnosis not present

## 2016-07-15 DIAGNOSIS — Z992 Dependence on renal dialysis: Secondary | ICD-10-CM | POA: Diagnosis not present

## 2016-07-15 DIAGNOSIS — D509 Iron deficiency anemia, unspecified: Secondary | ICD-10-CM | POA: Diagnosis not present

## 2016-07-15 DIAGNOSIS — N2581 Secondary hyperparathyroidism of renal origin: Secondary | ICD-10-CM | POA: Diagnosis not present

## 2016-07-17 DIAGNOSIS — N2581 Secondary hyperparathyroidism of renal origin: Secondary | ICD-10-CM | POA: Diagnosis not present

## 2016-07-17 DIAGNOSIS — Z992 Dependence on renal dialysis: Secondary | ICD-10-CM | POA: Diagnosis not present

## 2016-07-17 DIAGNOSIS — N186 End stage renal disease: Secondary | ICD-10-CM | POA: Diagnosis not present

## 2016-07-17 DIAGNOSIS — D509 Iron deficiency anemia, unspecified: Secondary | ICD-10-CM | POA: Diagnosis not present

## 2016-07-20 DIAGNOSIS — Z992 Dependence on renal dialysis: Secondary | ICD-10-CM | POA: Diagnosis not present

## 2016-07-20 DIAGNOSIS — N2581 Secondary hyperparathyroidism of renal origin: Secondary | ICD-10-CM | POA: Diagnosis not present

## 2016-07-20 DIAGNOSIS — N186 End stage renal disease: Secondary | ICD-10-CM | POA: Diagnosis not present

## 2016-07-20 DIAGNOSIS — D509 Iron deficiency anemia, unspecified: Secondary | ICD-10-CM | POA: Diagnosis not present

## 2016-07-22 DIAGNOSIS — N2581 Secondary hyperparathyroidism of renal origin: Secondary | ICD-10-CM | POA: Diagnosis not present

## 2016-07-22 DIAGNOSIS — D509 Iron deficiency anemia, unspecified: Secondary | ICD-10-CM | POA: Diagnosis not present

## 2016-07-22 DIAGNOSIS — N186 End stage renal disease: Secondary | ICD-10-CM | POA: Diagnosis not present

## 2016-07-22 DIAGNOSIS — Z992 Dependence on renal dialysis: Secondary | ICD-10-CM | POA: Diagnosis not present

## 2016-07-27 DIAGNOSIS — D509 Iron deficiency anemia, unspecified: Secondary | ICD-10-CM | POA: Diagnosis not present

## 2016-07-27 DIAGNOSIS — N2581 Secondary hyperparathyroidism of renal origin: Secondary | ICD-10-CM | POA: Diagnosis not present

## 2016-07-27 DIAGNOSIS — Z992 Dependence on renal dialysis: Secondary | ICD-10-CM | POA: Diagnosis not present

## 2016-07-27 DIAGNOSIS — N186 End stage renal disease: Secondary | ICD-10-CM | POA: Diagnosis not present

## 2016-07-29 DIAGNOSIS — N186 End stage renal disease: Secondary | ICD-10-CM | POA: Diagnosis not present

## 2016-07-29 DIAGNOSIS — N2581 Secondary hyperparathyroidism of renal origin: Secondary | ICD-10-CM | POA: Diagnosis not present

## 2016-07-29 DIAGNOSIS — Z992 Dependence on renal dialysis: Secondary | ICD-10-CM | POA: Diagnosis not present

## 2016-07-29 DIAGNOSIS — D509 Iron deficiency anemia, unspecified: Secondary | ICD-10-CM | POA: Diagnosis not present

## 2016-07-30 ENCOUNTER — Emergency Department (HOSPITAL_COMMUNITY): Payer: Medicare Other

## 2016-07-30 ENCOUNTER — Encounter (HOSPITAL_COMMUNITY): Payer: Self-pay | Admitting: Emergency Medicine

## 2016-07-30 ENCOUNTER — Emergency Department (HOSPITAL_COMMUNITY)
Admission: EM | Admit: 2016-07-30 | Discharge: 2016-07-30 | Disposition: A | Discharge status: Home / Self Care | Payer: Medicare Other | Attending: Emergency Medicine | Admitting: Emergency Medicine

## 2016-07-30 DIAGNOSIS — N186 End stage renal disease: Secondary | ICD-10-CM | POA: Diagnosis not present

## 2016-07-30 DIAGNOSIS — R1010 Upper abdominal pain, unspecified: Secondary | ICD-10-CM | POA: Diagnosis not present

## 2016-07-30 DIAGNOSIS — E039 Hypothyroidism, unspecified: Secondary | ICD-10-CM | POA: Insufficient documentation

## 2016-07-30 DIAGNOSIS — I120 Hypertensive chronic kidney disease with stage 5 chronic kidney disease or end stage renal disease: Secondary | ICD-10-CM | POA: Diagnosis not present

## 2016-07-30 DIAGNOSIS — R1011 Right upper quadrant pain: Secondary | ICD-10-CM | POA: Diagnosis not present

## 2016-07-30 DIAGNOSIS — K859 Acute pancreatitis without necrosis or infection, unspecified: Secondary | ICD-10-CM | POA: Diagnosis not present

## 2016-07-30 DIAGNOSIS — Z992 Dependence on renal dialysis: Secondary | ICD-10-CM | POA: Diagnosis not present

## 2016-07-30 DIAGNOSIS — R072 Precordial pain: Secondary | ICD-10-CM | POA: Diagnosis present

## 2016-07-30 DIAGNOSIS — Z79899 Other long term (current) drug therapy: Secondary | ICD-10-CM | POA: Insufficient documentation

## 2016-07-30 DIAGNOSIS — J45909 Unspecified asthma, uncomplicated: Secondary | ICD-10-CM | POA: Insufficient documentation

## 2016-07-30 DIAGNOSIS — R079 Chest pain, unspecified: Secondary | ICD-10-CM | POA: Diagnosis not present

## 2016-07-30 LAB — COMPREHENSIVE METABOLIC PANEL
ALBUMIN: 4.3 g/dL (ref 3.5–5.0)
ALK PHOS: 78 U/L (ref 38–126)
ALT: 12 U/L — AB (ref 17–63)
AST: 12 U/L — AB (ref 15–41)
Anion gap: 12 (ref 5–15)
BILIRUBIN TOTAL: 0.8 mg/dL (ref 0.3–1.2)
BUN: 26 mg/dL — AB (ref 6–20)
CALCIUM: 8.9 mg/dL (ref 8.9–10.3)
CO2: 29 mmol/L (ref 22–32)
Chloride: 96 mmol/L — ABNORMAL LOW (ref 101–111)
Creatinine, Ser: 13.52 mg/dL — ABNORMAL HIGH (ref 0.61–1.24)
GFR calc Af Amer: 4 mL/min — ABNORMAL LOW (ref >60)
GFR calc non Af Amer: 4 mL/min — ABNORMAL LOW (ref >60)
GLUCOSE: 83 mg/dL (ref 65–99)
Potassium: 5.2 mmol/L — ABNORMAL HIGH (ref 3.5–5.1)
SODIUM: 137 mmol/L (ref 135–145)
TOTAL PROTEIN: 7.7 g/dL (ref 6.5–8.1)

## 2016-07-30 LAB — CBC WITH DIFFERENTIAL/PLATELET
BASOS ABS: 0 10*3/uL (ref 0.0–0.1)
BASOS PCT: 0 %
Eosinophils Absolute: 0.2 10*3/uL (ref 0.0–0.7)
Eosinophils Relative: 2 %
HEMATOCRIT: 49.3 % (ref 39.0–52.0)
HEMOGLOBIN: 15.9 g/dL (ref 13.0–17.0)
Lymphocytes Relative: 24 %
Lymphs Abs: 2.4 10*3/uL (ref 0.7–4.0)
MCH: 29.9 pg (ref 26.0–34.0)
MCHC: 32.3 g/dL (ref 30.0–36.0)
MCV: 92.7 fL (ref 78.0–100.0)
MONOS PCT: 8 %
Monocytes Absolute: 0.8 10*3/uL (ref 0.1–1.0)
NEUTROS ABS: 6.5 10*3/uL (ref 1.7–7.7)
NEUTROS PCT: 66 %
Platelets: 157 10*3/uL (ref 150–400)
RBC: 5.32 MIL/uL (ref 4.22–5.81)
RDW: 12.8 % (ref 11.5–15.5)
WBC: 9.8 10*3/uL (ref 4.0–10.5)

## 2016-07-30 LAB — TROPONIN I: Troponin I: <0.03 ng/mL (ref <0.03)

## 2016-07-30 LAB — D-DIMER, QUANTITATIVE: D-Dimer, Quant: 0.52 ug/mL-FEU — ABNORMAL HIGH (ref 0.00–0.50)

## 2016-07-30 LAB — LIPASE, BLOOD: Lipase: 142 U/L — ABNORMAL HIGH (ref 11–51)

## 2016-07-30 MED ORDER — GABAPENTIN 300 MG PO CAPS
300.0000 mg | ORAL_CAPSULE | Freq: Once | ORAL | Status: AC
  Administered 2016-07-30: 300 mg via ORAL
  Filled 2016-07-30: qty 1

## 2016-07-30 MED ORDER — ONDANSETRON HCL 4 MG/2ML IJ SOLN
4.0000 mg | Freq: Once | INTRAMUSCULAR | Status: AC
  Administered 2016-07-30: 4 mg via INTRAVENOUS
  Filled 2016-07-30: qty 2

## 2016-07-30 MED ORDER — HYDROCODONE-ACETAMINOPHEN 5-325 MG PO TABS: 1.0000 | ORAL_TABLET | ORAL | 0 refills | Status: DC | PRN

## 2016-07-30 MED ORDER — MORPHINE SULFATE (PF) 4 MG/ML IV SOLN
4.0000 mg | Freq: Once | INTRAVENOUS | Status: AC
  Administered 2016-07-30: 4 mg via INTRAVENOUS
  Filled 2016-07-30: qty 1

## 2016-07-30 NOTE — ED Provider Notes (Signed)
[facility]-EMER[facility]ENCY DEPT Provider Note   CSN: [id number] Arrival date & time: 07/30/16  4098     History   Chief Complaint Chief Complaint  Patient presents with  . Chest Pain    HPI Robert Hays is a 50 y.o. male.  The history is provided by the patient.  Chest Pain   This is a new problem. The current episode started 12 to 24 hours ago (Started yesterday evening.  Did not start while on dialysis). The problem occurs constantly. The problem has not changed since onset.Associated with: Started at rest. The pain is present in the substernal region and epigastric region. The pain is at a severity of 6/10. The pain is moderate. The quality of the pain is described as pleuritic and sharp. The pain radiates to the mid back. Duration of episode(s) is 12 hours. The symptoms are aggravated by certain positions and deep breathing (Cough). Associated symptoms include back pain. Pertinent negatives include no abdominal pain, no cough, no diaphoresis, no exertional chest pressure, no fever, no leg pain, no lower extremity edema, no nausea, no shortness of breath, no sputum production, no syncope, no vomiting and no weakness. He has tried nothing for the symptoms. The treatment provided no relief. Risk factors include male gender (No alcohol, drug or tobacco abuse. Prior history of possible MI, end-stage renal disease).  His past medical history is significant for hyperlipidemia, hypertension and MI.  Pertinent negatives for past medical history include no diabetes. Past medical history comments: NSTEMI related to demand ischemia from CHF from peritoneal dialysis noncomplaince    Past Medical History:  Diagnosis Date  . Anemia   . Asthma   . Depression    pt denies  . Dysrhythmia    "skipped beats"  . Hypertension   . Hypothyroidism   . Myocardial infarction (HCC)    mild 1/16  . Neuropathy (HCC)   . Peripheral vascular disease (HCC)    right leg  . Peritoneal dialysis status (HCC)   .  Renal disorder    on dialysis T, TH, Sat  . Shortness of breath dyspnea    doe  . Sleep apnea    does not have cpap yet  . Umbilical hernia     Patient Active Problem List   Diagnosis Date Noted  . ESRD on dialysis (HCC)   . Headache 08/14/2015  . Sepsis (HCC) 08/10/2015  . Fever 08/10/2015  . Metabolic encephalopathy 08/10/2015  . Peritoneal dialysis status (HCC)   . Hypertension   . Acute renal failure syndrome (HCC)   . Essential hypertension     Past Surgical History:  Procedure Laterality Date  . AV FISTULA PLACEMENT Right 10/03/2015   Procedure: Right arm ARTERIOVENOUS  FISTULA CREATION;  Surgeon: Nada Libman, MD;  Location: [facility] OR;  Service: Vascular;  Laterality: Right;  . HERNIA REPAIR    . INSERTION OF DIALYSIS CATHETER     peritoneal  . INSERTION OF DIALYSIS CATHETER     right chest  . REMOVAL OF A DIALYSIS CATHETER N/A 09/12/2015   Procedure: REMOVAL OF A DIALYSIS CATHETER;  Surgeon: Renford Dills, MD;  Location: [facility] ORS;  Service: Vascular;  Laterality: N/A;       Home Medications    Prior to Admission medications   Medication Sig Start Date End Date Taking? Authorizing Provider  amLODipine (NORVASC) 5 MG tablet Take by mouth daily. am    Historical Provider, MD  DULoxetine (CYMBALTA) 30 MG capsule Take 30 mg by mouth.  pm    Historical Provider, MD  folic acid (FOLVITE) 1 MG tablet Take 1 mg by mouth daily. 05/26/15   Historical Provider, MD  FOSRENOL 1000 MG chewable tablet Chew 1,000 mg by mouth 4 (four) times daily. 500mg  with snacks 07/20/15   Historical Provider, MD  gabapentin (NEURONTIN) 300 MG capsule Take 300 mg by mouth 2 (two) times daily. 06/03/15   Historical Provider, MD  levothyroxine (SYNTHROID, LEVOTHROID) 25 MCG tablet Take 25 mcg by mouth daily before breakfast.    Historical Provider, MD  loratadine (CLARITIN) 10 MG tablet Take 1 tablet (10 mg total) by mouth daily. 08/15/15   Joseph Art, DO  oxyCODONE (ROXICODONE) 5 MG immediate  release tablet Take 1 tablet (5 mg total) by mouth every 6 (six) hours as needed. Patient not taking: Reported on 11/17/2015 10/03/15   Samantha J Rhyne, PA-C  TOPROL XL 25 MG 24 hr tablet Take 25 mg by mouth daily. 05/26/15   Historical Provider, MD    Family History Family History  Problem Relation Age of Onset  . Hypertension Mother   . Kidney disease Mother   . Diabetes Father     Social History Social History  Substance Use Topics  . Smoking status: Never Smoker  . Smokeless tobacco: Never Used  . Alcohol use No     Allergies   Allegra [fexofenadine]; Diphenhydramine; and Eggs or egg-derived products   Review of Systems Review of Systems  Constitutional: Negative for diaphoresis and fever.  Respiratory: Negative for cough, sputum production and shortness of breath.   Cardiovascular: Positive for chest pain. Negative for syncope.  Gastrointestinal: Negative for abdominal pain, nausea and vomiting.  Musculoskeletal: Positive for back pain.  Neurological: Negative for weakness.  All other systems reviewed and are negative.    Physical Exam Updated Vital Signs BP 178/95 (BP Location: Left Arm)   Pulse 90   Temp 98.7 F (37.1 C) (Oral)   Resp 18   Ht 5\' 8"  (1.727 m)   Wt 210 lb (95.3 kg)   SpO2 100%   BMI 31.93 kg/m   Physical Exam  Constitutional: He is oriented to person, place, and time. He appears well-developed and well-nourished. No distress.  HENT:  Head: Normocephalic and atraumatic.  Mouth/Throat: Oropharynx is clear and moist.  Eyes: Conjunctivae and EOM are normal. Pupils are equal, round, and reactive to light.  Neck: Normal range of motion. Neck supple.  Cardiovascular: Normal rate, regular rhythm and intact distal pulses.   No murmur heard. Pulmonary/Chest: Effort normal and breath sounds normal. No respiratory distress. He has no wheezes. He has no rales.  Abdominal: Soft. He exhibits no distension. There is tenderness in the right upper  quadrant, epigastric area and left upper quadrant. There is no rebound and no guarding.  Minimal tenderness with no rebound or guarding  Musculoskeletal: Normal range of motion. He exhibits no edema or tenderness.  Graft present in the right forearm with palpable thrill  Neurological: He is alert and oriented to person, place, and time.  Skin: Skin is warm and dry. No rash noted. No erythema.  Psychiatric: He has a normal mood and affect. His behavior is normal.  Nursing note and vitals reviewed.    ED Treatments / Results  Labs (all labs ordered are listed, but only abnormal results are displayed) Labs Reviewed  COMPREHENSIVE METABOLIC PANEL - Abnormal; Notable for the following:       Result Value   Potassium 5.2 (*)  Chloride 96 (*)    BUN 26 (*)    Creatinine, Ser 13.52 (*)    AST 12 (*)    ALT 12 (*)    GFR calc non Af Amer 4 (*)    GFR calc Af Amer 4 (*)    All other components within normal limits  LIPASE, BLOOD - Abnormal; Notable for the following:    Lipase 142 (*)    All other components within normal limits  D-DIMER, QUANTITATIVE (NOT AT [facility]) - Abnormal; Notable for the following:    D-Dimer, Quant 0.52 (*)    All other components within normal limits  CBC WITH DIFFERENTIAL/PLATELET  TROPONIN I    EKG  EKG Interpretation  Date/Time:  Friday July 30 2016 07:02:13 EDT Ventricular Rate:  84 PR Interval:    QRS Duration: 85 QT Interval:  370 QTC Calculation: 438 R Axis:   6 Text Interpretation:  Sinus rhythm Nonspecific T abnormalities, lateral leads T wave inversion improved since prior tracing Confirmed by Anitra LauthPLUNKETT  MD, Alphonzo LemmingsWHITNEY ([id number]) on 07/30/2016 7:19:30 AM       Radiology Dg Chest 2 View  Result Date: 07/30/2016 CLINICAL DATA:  Chest pain. EXAM: CHEST  2 VIEW COMPARISON:  09/05/2015 FINDINGS: The heart size and pulmonary vascularity are normal. Lungs are clear except for minimal atelectasis at both lung bases posteriorly. Calcification in the  thoracic aorta. Bones are normal. IMPRESSION: Minimal atelectasis of 1 of the lung bases posteriorly. Aortic atherosclerosis. Electronically Signed   By: Francene BoyersJames  Maxwell M.D.   On: 07/30/2016 08:07   [place] Abdomen Complete  Result Date: 07/30/2016 CLINICAL DATA:  Right upper abdominal pain ; history of dialysis dependent renal failure, questionable polycystic kidneys. EXAM: ABDOMEN ULTRASOUND COMPLETE COMPARISON:  Abdominal pelvic CT scan of December 11, 2013 FINDINGS: Gallbladder: No gallstones or wall thickening visualized. No sonographic Murphy sign noted by sonographer. Common bile duct: Diameter: 5.8- 8.4 mm. No intraluminal stones are observed. Liver: The hepatic echotexture is normal. There is no focal mass nor ductal dilation. IVC: Normal where visualized Pancreas: There is limited visibility of the pancreatic head and tail. The pancreatic body is normal. Spleen: Size and appearance within normal limits. Right Kidney: Length: 16.1 cm. There are multiple cysts of varying sizes occupying almost all of the renal parenchyma. The largest measures 4.2 x 2.9 x 3.1. No hydronephrosis visualized. No normal renal parenchyma is demonstrated. Left Kidney: Length: 18.8 cm. There are multiple cysts measuring as much as 4.1 cm in diameter. No hydronephrosis is observed. No normal renal parenchyma is demonstrated. Abdominal aorta: 2.8 cm proximally. Bowel gas obscures the distal aorta. Other findings: No ascites is observed. IMPRESSION: 1. No gallstones or sonographic evidence of acute cholecystitis. Mild common bile ductal dilation without evidence of intraluminal stones or sludge. Normal appearance of the liver. 2. Limited visibility of the pancreatic head and tail due to bowel gas. The pancreatic body is unremarkable. 3. Enlarged kidneys with innumerable cysts of varying sizes consistent with polycystic kidney disease. No hydronephrosis. Electronically Signed   By: David  SwazilandJordan M.D.   On: 07/30/2016 10:03     Procedures Procedures (including critical care time)  Medications Ordered in ED Medications  gabapentin (NEURONTIN) capsule 300 mg (300 mg Oral Given 07/30/16 0714)  morphine 4 MG/ML injection 4 mg (4 mg Intravenous Given 07/30/16 1002)  ondansetron (ZOFRAN) injection 4 mg (4 mg Intravenous Given 07/30/16 1002)     Initial Impression / Assessment and Plan / ED Course  I have reviewed  the triage vital signs and the nursing notes.  Pertinent labs & imaging results that were available during my care of the patient were reviewed by me and considered in my medical decision making (see chart for details).  Clinical Course   Patient is a 50 year old male presenting today complaining of pleuritic-type chest and upper abdominal pain that radiates to his back. Patient denies shortness of breath, diaphoresis, nausea or vomiting.  Pain is not affected by eating or positioning.  It did not start during dialysis yesterday but this started in the evening. It has been constant for the last 12 hours. The only time patient had this pain in the past he said he may have had something wrong with his heart but he is not sure. No prior stents.  He denies any unilateral leg pain or swelling and no prior history of PEs. He is not a smoker. He denies any infectious symptoms such as productive cough or fever. On exam patient has mild epigastric, left and right upper quadrant tenderness. No reproducible chest tenderness. No other abnormal exam findings. Patient's symptoms are atypical for ACS. EKG today shows no signs of ST elevation and T-wave inversion present are improved from prior EKGs. Prior hx of NSTEMI but thought to be due to demand ischemia from noncomplaince with peritoneal dialysis. Low suspicion for pneumothorax. Patient is at low risk for PE however is a possibility. Also lower suspicion for dissection. Could be that patient's symptoms are GI related he does take multiple medications and does not take a PPI  or H2 blocker. He denies any recent medication changes and has not taken oxycodone since his surgery in 2016.  Patient given morphine and Zofran for pain. Chest x-ray, CBC, CMP, lipase, troponin, d-dimer pending.  8:40 AM Patient found to have a normal troponin, chest x-ray without acute findings, CBC within normal limits, CMP with mild hyperkalemia at 5.2 and an elevated lipase of 142. Patient's d-dimer is minimally elevated at 0.52. However within elevated lipase feel that GI issues are more likely at this time given radiation into the back. Patient currently refused pain medication and feels that he is okay.  Abdominal ultrasound pending to evaluate for gallstone issues as the cause of the mild pancreatitis.  11:15 AM Ultrasound without acute findings other than minimal dilation of the common bile duct but no stone or sludge present. After 1 dose of pain medication patient's pain is in this completely resolved. He still has some pain with certain movements and deep breathing. Patient does have a mildly elevated d-dimer however low suspicion for PE at this time. He receives heparin 3 times a week has no unilateral leg pain or swelling is a nonsmoker no recent surgeries or long trips.  He did not have access to CAT scan her currently however discussed with patient the option of undergoing elsewhere to get a CAT scan today versus watchful waiting. Patient will do a bland diet as well as pain medicine as needed and return within 1-2 days if pain worsens. This was discussed with the patient he was comfortable with the plan. He has follow-up for dialysis tomorrow.  Final Clinical Impressions(s) / ED Diagnoses   Final diagnoses:  Upper abdominal pain  Acute pancreatitis, unspecified pancreatitis type    New Prescriptions New Prescriptions   HYDROCODONE-ACETAMINOPHEN (NORCO/VICODIN) 5-325 MG TABLET    Take 1-2 tablets by mouth every 4 (four) hours as needed for severe pain.     Gwyneth Sprout,  MD 07/30/16 1126

## 2016-07-30 NOTE — ED Notes (Signed)
Patient to US. No distress

## 2016-07-30 NOTE — ED Triage Notes (Signed)
PT c/o center chest pain that radiates through to his back starting yesterday evening after completion on dialysis. PT denies any SOB. PT states pain is worse with deep breaths and coughing.

## 2016-07-30 NOTE — ED Notes (Signed)
Patient states he does not feel like he needs morphine at this time. Informed patient to let nursing staff know if his pain is worse and needs medication. Verbal understanding acknowledged.

## 2016-07-30 NOTE — ED Notes (Signed)
Pt requesting pain medication at this time. States US aggravated his pain. 8/10 on NPS. Held morphine and zofran administered.

## 2016-07-31 DIAGNOSIS — Z992 Dependence on renal dialysis: Secondary | ICD-10-CM | POA: Diagnosis not present

## 2016-07-31 DIAGNOSIS — N2581 Secondary hyperparathyroidism of renal origin: Secondary | ICD-10-CM | POA: Diagnosis not present

## 2016-07-31 DIAGNOSIS — D509 Iron deficiency anemia, unspecified: Secondary | ICD-10-CM | POA: Diagnosis not present

## 2016-07-31 DIAGNOSIS — N186 End stage renal disease: Secondary | ICD-10-CM | POA: Diagnosis not present

## 2016-08-03 DIAGNOSIS — Z992 Dependence on renal dialysis: Secondary | ICD-10-CM | POA: Diagnosis not present

## 2016-08-03 DIAGNOSIS — N186 End stage renal disease: Secondary | ICD-10-CM | POA: Diagnosis not present

## 2016-08-03 DIAGNOSIS — D509 Iron deficiency anemia, unspecified: Secondary | ICD-10-CM | POA: Diagnosis not present

## 2016-08-03 DIAGNOSIS — N2581 Secondary hyperparathyroidism of renal origin: Secondary | ICD-10-CM | POA: Diagnosis not present

## 2016-08-05 DIAGNOSIS — N2581 Secondary hyperparathyroidism of renal origin: Secondary | ICD-10-CM | POA: Diagnosis not present

## 2016-08-05 DIAGNOSIS — Z992 Dependence on renal dialysis: Secondary | ICD-10-CM | POA: Diagnosis not present

## 2016-08-05 DIAGNOSIS — D509 Iron deficiency anemia, unspecified: Secondary | ICD-10-CM | POA: Diagnosis not present

## 2016-08-05 DIAGNOSIS — N186 End stage renal disease: Secondary | ICD-10-CM | POA: Diagnosis not present

## 2016-08-07 DIAGNOSIS — Z992 Dependence on renal dialysis: Secondary | ICD-10-CM | POA: Diagnosis not present

## 2016-08-07 DIAGNOSIS — D509 Iron deficiency anemia, unspecified: Secondary | ICD-10-CM | POA: Diagnosis not present

## 2016-08-07 DIAGNOSIS — Z23 Encounter for immunization: Secondary | ICD-10-CM | POA: Diagnosis not present

## 2016-08-07 DIAGNOSIS — N2581 Secondary hyperparathyroidism of renal origin: Secondary | ICD-10-CM | POA: Diagnosis not present

## 2016-08-07 DIAGNOSIS — N186 End stage renal disease: Secondary | ICD-10-CM | POA: Diagnosis not present

## 2016-08-10 DIAGNOSIS — N186 End stage renal disease: Secondary | ICD-10-CM | POA: Diagnosis not present

## 2016-08-10 DIAGNOSIS — D509 Iron deficiency anemia, unspecified: Secondary | ICD-10-CM | POA: Diagnosis not present

## 2016-08-10 DIAGNOSIS — Z23 Encounter for immunization: Secondary | ICD-10-CM | POA: Diagnosis not present

## 2016-08-10 DIAGNOSIS — Z992 Dependence on renal dialysis: Secondary | ICD-10-CM | POA: Diagnosis not present

## 2016-08-10 DIAGNOSIS — N2581 Secondary hyperparathyroidism of renal origin: Secondary | ICD-10-CM | POA: Diagnosis not present

## 2016-08-12 DIAGNOSIS — N2581 Secondary hyperparathyroidism of renal origin: Secondary | ICD-10-CM | POA: Diagnosis not present

## 2016-08-12 DIAGNOSIS — N186 End stage renal disease: Secondary | ICD-10-CM | POA: Diagnosis not present

## 2016-08-12 DIAGNOSIS — Z992 Dependence on renal dialysis: Secondary | ICD-10-CM | POA: Diagnosis not present

## 2016-08-12 DIAGNOSIS — Z23 Encounter for immunization: Secondary | ICD-10-CM | POA: Diagnosis not present

## 2016-08-12 DIAGNOSIS — D509 Iron deficiency anemia, unspecified: Secondary | ICD-10-CM | POA: Diagnosis not present

## 2016-08-14 DIAGNOSIS — Z23 Encounter for immunization: Secondary | ICD-10-CM | POA: Diagnosis not present

## 2016-08-14 DIAGNOSIS — N186 End stage renal disease: Secondary | ICD-10-CM | POA: Diagnosis not present

## 2016-08-14 DIAGNOSIS — D509 Iron deficiency anemia, unspecified: Secondary | ICD-10-CM | POA: Diagnosis not present

## 2016-08-14 DIAGNOSIS — N2581 Secondary hyperparathyroidism of renal origin: Secondary | ICD-10-CM | POA: Diagnosis not present

## 2016-08-14 DIAGNOSIS — Z992 Dependence on renal dialysis: Secondary | ICD-10-CM | POA: Diagnosis not present

## 2016-08-16 DIAGNOSIS — D509 Iron deficiency anemia, unspecified: Secondary | ICD-10-CM | POA: Diagnosis not present

## 2016-08-16 DIAGNOSIS — N186 End stage renal disease: Secondary | ICD-10-CM | POA: Diagnosis not present

## 2016-08-16 DIAGNOSIS — Z992 Dependence on renal dialysis: Secondary | ICD-10-CM | POA: Diagnosis not present

## 2016-08-16 DIAGNOSIS — N2581 Secondary hyperparathyroidism of renal origin: Secondary | ICD-10-CM | POA: Diagnosis not present

## 2016-08-16 DIAGNOSIS — Z23 Encounter for immunization: Secondary | ICD-10-CM | POA: Diagnosis not present

## 2016-08-19 DIAGNOSIS — Z23 Encounter for immunization: Secondary | ICD-10-CM | POA: Diagnosis not present

## 2016-08-19 DIAGNOSIS — N2581 Secondary hyperparathyroidism of renal origin: Secondary | ICD-10-CM | POA: Diagnosis not present

## 2016-08-19 DIAGNOSIS — D509 Iron deficiency anemia, unspecified: Secondary | ICD-10-CM | POA: Diagnosis not present

## 2016-08-19 DIAGNOSIS — Z992 Dependence on renal dialysis: Secondary | ICD-10-CM | POA: Diagnosis not present

## 2016-08-19 DIAGNOSIS — N186 End stage renal disease: Secondary | ICD-10-CM | POA: Diagnosis not present

## 2016-08-21 DIAGNOSIS — D509 Iron deficiency anemia, unspecified: Secondary | ICD-10-CM | POA: Diagnosis not present

## 2016-08-21 DIAGNOSIS — Z992 Dependence on renal dialysis: Secondary | ICD-10-CM | POA: Diagnosis not present

## 2016-08-21 DIAGNOSIS — N186 End stage renal disease: Secondary | ICD-10-CM | POA: Diagnosis not present

## 2016-08-21 DIAGNOSIS — N2581 Secondary hyperparathyroidism of renal origin: Secondary | ICD-10-CM | POA: Diagnosis not present

## 2016-08-21 DIAGNOSIS — Z23 Encounter for immunization: Secondary | ICD-10-CM | POA: Diagnosis not present

## 2016-08-24 DIAGNOSIS — N2581 Secondary hyperparathyroidism of renal origin: Secondary | ICD-10-CM | POA: Diagnosis not present

## 2016-08-24 DIAGNOSIS — Z992 Dependence on renal dialysis: Secondary | ICD-10-CM | POA: Diagnosis not present

## 2016-08-24 DIAGNOSIS — D509 Iron deficiency anemia, unspecified: Secondary | ICD-10-CM | POA: Diagnosis not present

## 2016-08-24 DIAGNOSIS — Z23 Encounter for immunization: Secondary | ICD-10-CM | POA: Diagnosis not present

## 2016-08-24 DIAGNOSIS — N186 End stage renal disease: Secondary | ICD-10-CM | POA: Diagnosis not present

## 2016-08-26 DIAGNOSIS — D509 Iron deficiency anemia, unspecified: Secondary | ICD-10-CM | POA: Diagnosis not present

## 2016-08-26 DIAGNOSIS — N2581 Secondary hyperparathyroidism of renal origin: Secondary | ICD-10-CM | POA: Diagnosis not present

## 2016-08-26 DIAGNOSIS — N186 End stage renal disease: Secondary | ICD-10-CM | POA: Diagnosis not present

## 2016-08-26 DIAGNOSIS — Z23 Encounter for immunization: Secondary | ICD-10-CM | POA: Diagnosis not present

## 2016-08-26 DIAGNOSIS — Z992 Dependence on renal dialysis: Secondary | ICD-10-CM | POA: Diagnosis not present

## 2016-08-28 DIAGNOSIS — N2581 Secondary hyperparathyroidism of renal origin: Secondary | ICD-10-CM | POA: Diagnosis not present

## 2016-08-28 DIAGNOSIS — Z992 Dependence on renal dialysis: Secondary | ICD-10-CM | POA: Diagnosis not present

## 2016-08-28 DIAGNOSIS — N186 End stage renal disease: Secondary | ICD-10-CM | POA: Diagnosis not present

## 2016-08-28 DIAGNOSIS — D509 Iron deficiency anemia, unspecified: Secondary | ICD-10-CM | POA: Diagnosis not present

## 2016-08-28 DIAGNOSIS — Z23 Encounter for immunization: Secondary | ICD-10-CM | POA: Diagnosis not present

## 2016-08-31 ENCOUNTER — Other Ambulatory Visit: Payer: Self-pay | Admitting: Vascular Surgery

## 2016-08-31 ENCOUNTER — Encounter: Payer: Self-pay | Admitting: Vascular Surgery

## 2016-08-31 DIAGNOSIS — N2581 Secondary hyperparathyroidism of renal origin: Secondary | ICD-10-CM | POA: Diagnosis not present

## 2016-08-31 DIAGNOSIS — Z992 Dependence on renal dialysis: Secondary | ICD-10-CM | POA: Diagnosis not present

## 2016-08-31 DIAGNOSIS — D509 Iron deficiency anemia, unspecified: Secondary | ICD-10-CM | POA: Diagnosis not present

## 2016-08-31 DIAGNOSIS — T82598D Other mechanical complication of other cardiac and vascular devices and implants, subsequent encounter: Secondary | ICD-10-CM

## 2016-08-31 DIAGNOSIS — N186 End stage renal disease: Secondary | ICD-10-CM | POA: Diagnosis not present

## 2016-08-31 DIAGNOSIS — Z23 Encounter for immunization: Secondary | ICD-10-CM | POA: Diagnosis not present

## 2016-08-31 NOTE — Addendum Note (Signed)
Addended by: Yolonda KidaEVANS, ASHLEIGH N on: 08/31/2016 01:30 PM   Modules accepted: Orders

## 2016-09-01 ENCOUNTER — Encounter: Payer: Self-pay | Admitting: Vascular Surgery

## 2016-09-01 ENCOUNTER — Encounter (HOSPITAL_COMMUNITY): Payer: Medicare Other

## 2016-09-01 ENCOUNTER — Encounter: Payer: Self-pay | Admitting: *Deleted

## 2016-09-01 ENCOUNTER — Other Ambulatory Visit: Payer: Self-pay | Admitting: *Deleted

## 2016-09-01 ENCOUNTER — Ambulatory Visit (HOSPITAL_COMMUNITY)
Admission: RE | Admit: 2016-09-01 | Discharge: 2016-09-01 | Disposition: A | Discharge status: Home / Self Care | Payer: Medicare Other | Source: Ambulatory Visit | Attending: Vascular Surgery | Admitting: Vascular Surgery

## 2016-09-01 ENCOUNTER — Ambulatory Visit: Payer: Medicare Other | Admitting: Vascular Surgery

## 2016-09-01 ENCOUNTER — Ambulatory Visit (INDEPENDENT_AMBULATORY_CARE_PROVIDER_SITE_OTHER): Payer: Medicare Other | Admitting: Vascular Surgery

## 2016-09-01 VITALS — BP 174/96 | HR 89 | Temp 99.0°F | Resp 14 | Ht 68.0 in | Wt 206.0 lb

## 2016-09-01 DIAGNOSIS — T82598D Other mechanical complication of other cardiac and vascular devices and implants, subsequent encounter: Secondary | ICD-10-CM

## 2016-09-01 DIAGNOSIS — N186 End stage renal disease: Secondary | ICD-10-CM

## 2016-09-01 DIAGNOSIS — I82611 Acute embolism and thrombosis of superficial veins of right upper extremity: Secondary | ICD-10-CM | POA: Insufficient documentation

## 2016-09-01 NOTE — Progress Notes (Signed)
Patient name: Robert Hays MRN: [id number] DOB: March 22, 1966 Sex: male  REASON FOR CONSULT: Prolonged bleeding from AV access. Referred by Dr. Kristian Covey.  HPI: Robert Hays is a 50 y.o. male, who had a right radiocephalic AV fistula placed on 40/98/1191. He was last seen in our office on 11/17/2015 which time the fistula had an excellent thrill and it was felt that this could be used 3 months after the procedure. He did have some small competing branches but these did not appear to be an issue.  Lately he has had some problems with bleeding after dialysis. He was sent for further evaluation. He denies pain or paresthesias in her right arm.  He dialyzes on Tuesdays Thursdays and Saturdays.  Past Medical History:  Diagnosis Date  . Anemia   . Asthma   . Depression    pt denies  . Dysrhythmia    "skipped beats"  . Hypertension   . Hypothyroidism   . Myocardial infarction (HCC)    mild 1/16  . Neuropathy (HCC)   . Peripheral vascular disease (HCC)    right leg  . Peritoneal dialysis status (HCC)   . Renal disorder    on dialysis T, TH, Sat  . Shortness of breath dyspnea    doe  . Sleep apnea    does not have cpap yet  . Umbilical hernia     Family History  Problem Relation Age of Onset  . Hypertension Mother   . Kidney disease Mother   . Diabetes Father     SOCIAL HISTORY: Social History   Social History  . Marital status: Married    Spouse name: N/A  . Number of children: N/A  . Years of education: N/A   Occupational History  . Not on file.   Social History Main Topics  . Smoking status: Never Smoker  . Smokeless tobacco: Never Used  . Alcohol use No  . Drug use: No  . Sexual activity: Not on file   Other Topics Concern  . Not on file   Social History Narrative  . No narrative on file    Allergies  Allergen Reactions  . Allegra [Fexofenadine] Other (See Comments)    hallucinations  . Diphenhydramine Other (See Comments)    mental  status change  . Eggs Or Egg-Derived Products Nausea And Vomiting    Current Outpatient Prescriptions  Medication Sig Dispense Refill  . amLODipine (NORVASC) 5 MG tablet Take by mouth daily. am    . atorvastatin (LIPITOR) 40 MG tablet Take 40 mg by mouth every other day.    . Cholecalciferol (VITAMIN D-1000 MAX ST) 1000 units tablet Take 2,000 tablets by mouth daily.    . cinacalcet (SENSIPAR) 30 MG tablet Take 30 mg by mouth daily.    . DULoxetine (CYMBALTA) 60 MG capsule Take 60 mg by mouth daily.    . folic acid (FOLVITE) 1 MG tablet Take 1 mg by mouth daily.    Marland Kitchen FOSRENOL 1000 MG chewable tablet Chew 1,000-4,000 mg by mouth 4 (four) times daily. 4000 with meals and 1000mg  with snacks.    . gabapentin (NEURONTIN) 300 MG capsule Take 300 mg by mouth 3 (three) times daily.     Marland Kitchen levothyroxine (SYNTHROID, LEVOTHROID) 25 MCG tablet Take 25 mcg by mouth daily before breakfast.    . loratadine (CLARITIN) 10 MG tablet Take 1 tablet (10 mg total) by mouth daily. 30 tablet 0  . RENVELA 2.4 g PACK Take 1 Package by mouth  4 (four) times daily.    . TOPROL XL 25 MG 24 hr tablet Take 25 mg by mouth daily.    Marland Kitchen HYDROcodone-acetaminophen (NORCO/VICODIN) 5-325 MG tablet Take 1-2 tablets by mouth every 4 (four) hours as needed for severe pain. (Patient not taking: Reported on 09/01/2016) 10 tablet 0   No current facility-administered medications for this visit.     REVIEW OF SYSTEMS:  [X]  denotes positive finding, [ ]  denotes negative finding Cardiac  Comments:  Chest pain or chest pressure: X   Shortness of breath upon exertion: X   Short of breath when lying flat:    Irregular heart rhythm:        Vascular    Pain in calf, thigh, or hip brought on by ambulation:    Pain in feet at night that wakes you up from your sleep:     Blood clot in your veins:    Leg swelling:         Pulmonary    Oxygen at home:    Productive cough:     Wheezing:         Neurologic    Sudden weakness in arms or  legs:     Sudden numbness in arms or legs:     Sudden onset of difficulty speaking or slurred speech:    Temporary loss of vision in one eye:     Problems with dizziness:         Gastrointestinal    Blood in stool:     Vomited blood:         Genitourinary    Burning when urinating:     Blood in urine:        Psychiatric    Major depression:         Hematologic    Bleeding problems:    Problems with blood clotting too easily:        Skin    Rashes or ulcers:        Constitutional    Fever or chills:      PHYSICAL EXAM: Vitals:   09/01/16 0916  BP: (!) 174/96  Pulse: 89  Resp: 14  Temp: 99 F (37.2 C)  TempSrc: Oral  SpO2: 97%  Weight: 206 lb (93.4 kg)  Height: 5\' 8"  (1.727 m)    GENERAL: The patient is a well-nourished male, in no acute distress. The vital signs are documented above. CARDIAC: There is a regular rate and rhythm.  VASCULAR: He has an excellent thrill in his right radial cephalic fistula proximally. It is not especially pulsatile. It is slightly more difficult to follow up the forearm. PULMONARY: There is good air exchange bilaterally without wheezing or rales. NEUROLOGIC: No focal weakness or paresthesias are detected. SKIN: There are no ulcers or rashes noted. PSYCHIATRIC: The patient has a normal affect.  DATA:   Duplex of right radiocephalic AV fistula:  I have independently interpreted the duplex of his right radiocephalic AV fistula. The diameters of the fistula range from 0.56-1.29 cm. The depths are very reasonable. The vein does have a dual segment above the arterial anastomosis. There is noted to be some minimally occlusive thrombus versus wall thickening in the distal forearm outflow vein. There is minimally occlusive chronic thrombus in the antecubital fossa level outflow vein.  MEDICAL ISSUES:  BLEEDING AFTER DIALYSIS FROM RIGHT RADIOCEPHALIC AV FISTULA: There are several issues with the fistula identified on the duplex scan. Given  that he is having problems with bleeding  I have recommended we proceed with a fistulogram. If he has an outflow stenosis certainly this could result in prolonged bleeding after dialysis. We have discussed indications for the procedure and the potential complications. If I see something amenable to venoplasty this could be addressed at the same time. Otherwise we'll have the information we need to determine if there something surgically correctable to prevent his problems with prolonged bleeding. This procedure has been scheduled for 09/13/2016.   Robert Hays, [facility] [place] [facility] [phone]

## 2016-09-02 DIAGNOSIS — D509 Iron deficiency anemia, unspecified: Secondary | ICD-10-CM | POA: Diagnosis not present

## 2016-09-02 DIAGNOSIS — Z23 Encounter for immunization: Secondary | ICD-10-CM | POA: Diagnosis not present

## 2016-09-02 DIAGNOSIS — Z992 Dependence on renal dialysis: Secondary | ICD-10-CM | POA: Diagnosis not present

## 2016-09-02 DIAGNOSIS — N186 End stage renal disease: Secondary | ICD-10-CM | POA: Diagnosis not present

## 2016-09-02 DIAGNOSIS — N2581 Secondary hyperparathyroidism of renal origin: Secondary | ICD-10-CM | POA: Diagnosis not present

## 2016-09-04 DIAGNOSIS — N186 End stage renal disease: Secondary | ICD-10-CM | POA: Diagnosis not present

## 2016-09-04 DIAGNOSIS — Z992 Dependence on renal dialysis: Secondary | ICD-10-CM | POA: Diagnosis not present

## 2016-09-07 DIAGNOSIS — N186 End stage renal disease: Secondary | ICD-10-CM | POA: Diagnosis not present

## 2016-09-07 DIAGNOSIS — N2581 Secondary hyperparathyroidism of renal origin: Secondary | ICD-10-CM | POA: Diagnosis not present

## 2016-09-07 DIAGNOSIS — D509 Iron deficiency anemia, unspecified: Secondary | ICD-10-CM | POA: Diagnosis not present

## 2016-09-07 DIAGNOSIS — Z992 Dependence on renal dialysis: Secondary | ICD-10-CM | POA: Diagnosis not present

## 2016-09-08 ENCOUNTER — Telehealth: Payer: Self-pay

## 2016-09-08 NOTE — Telephone Encounter (Signed)
Received call from Mr. Jenelle MagesHairston requesting to cancel his procedure that is scheduled for Monday, 09/13/16. Patient states, "I just don't want to do it right now." Instructed patient to call our office if he decided to proceed with procedure and/or if he has any questions/concerns. Patient verbalized understanding. Spoke with Clydie BraunKaren in Peripheral Vascular Lab to cancel Right arm fistulogram with Dr. Edilia Boickson.

## 2016-09-09 DIAGNOSIS — Z992 Dependence on renal dialysis: Secondary | ICD-10-CM | POA: Diagnosis not present

## 2016-09-09 DIAGNOSIS — D509 Iron deficiency anemia, unspecified: Secondary | ICD-10-CM | POA: Diagnosis not present

## 2016-09-09 DIAGNOSIS — N2581 Secondary hyperparathyroidism of renal origin: Secondary | ICD-10-CM | POA: Diagnosis not present

## 2016-09-09 DIAGNOSIS — N186 End stage renal disease: Secondary | ICD-10-CM | POA: Diagnosis not present

## 2016-09-11 DIAGNOSIS — D509 Iron deficiency anemia, unspecified: Secondary | ICD-10-CM | POA: Diagnosis not present

## 2016-09-11 DIAGNOSIS — Z992 Dependence on renal dialysis: Secondary | ICD-10-CM | POA: Diagnosis not present

## 2016-09-11 DIAGNOSIS — N186 End stage renal disease: Secondary | ICD-10-CM | POA: Diagnosis not present

## 2016-09-11 DIAGNOSIS — N2581 Secondary hyperparathyroidism of renal origin: Secondary | ICD-10-CM | POA: Diagnosis not present

## 2016-09-13 ENCOUNTER — Encounter (HOSPITAL_COMMUNITY): Payer: Self-pay

## 2016-09-13 ENCOUNTER — Ambulatory Visit (HOSPITAL_COMMUNITY): Admit: 2016-09-13 | Payer: Medicare Other | Admitting: Vascular Surgery

## 2016-09-13 SURGERY — A/V SHUNTOGRAM/FISTULAGRAM

## 2016-09-14 DIAGNOSIS — D509 Iron deficiency anemia, unspecified: Secondary | ICD-10-CM | POA: Diagnosis not present

## 2016-09-14 DIAGNOSIS — Z992 Dependence on renal dialysis: Secondary | ICD-10-CM | POA: Diagnosis not present

## 2016-09-14 DIAGNOSIS — N186 End stage renal disease: Secondary | ICD-10-CM | POA: Diagnosis not present

## 2016-09-14 DIAGNOSIS — N2581 Secondary hyperparathyroidism of renal origin: Secondary | ICD-10-CM | POA: Diagnosis not present

## 2016-09-18 DIAGNOSIS — D509 Iron deficiency anemia, unspecified: Secondary | ICD-10-CM | POA: Diagnosis not present

## 2016-09-18 DIAGNOSIS — Z992 Dependence on renal dialysis: Secondary | ICD-10-CM | POA: Diagnosis not present

## 2016-09-18 DIAGNOSIS — N2581 Secondary hyperparathyroidism of renal origin: Secondary | ICD-10-CM | POA: Diagnosis not present

## 2016-09-18 DIAGNOSIS — N186 End stage renal disease: Secondary | ICD-10-CM | POA: Diagnosis not present

## 2016-09-20 DIAGNOSIS — M792 Neuralgia and neuritis, unspecified: Secondary | ICD-10-CM | POA: Diagnosis not present

## 2016-09-20 DIAGNOSIS — G4733 Obstructive sleep apnea (adult) (pediatric): Secondary | ICD-10-CM | POA: Diagnosis not present

## 2016-09-20 DIAGNOSIS — I10 Essential (primary) hypertension: Secondary | ICD-10-CM | POA: Diagnosis not present

## 2016-09-20 DIAGNOSIS — N289 Disorder of kidney and ureter, unspecified: Secondary | ICD-10-CM | POA: Diagnosis not present

## 2016-09-20 DIAGNOSIS — G603 Idiopathic progressive neuropathy: Secondary | ICD-10-CM | POA: Diagnosis not present

## 2016-09-20 DIAGNOSIS — M1009 Idiopathic gout, multiple sites: Secondary | ICD-10-CM | POA: Diagnosis not present

## 2016-09-20 DIAGNOSIS — Z79899 Other long term (current) drug therapy: Secondary | ICD-10-CM | POA: Diagnosis not present

## 2016-09-21 DIAGNOSIS — Z992 Dependence on renal dialysis: Secondary | ICD-10-CM | POA: Diagnosis not present

## 2016-09-21 DIAGNOSIS — D509 Iron deficiency anemia, unspecified: Secondary | ICD-10-CM | POA: Diagnosis not present

## 2016-09-21 DIAGNOSIS — N186 End stage renal disease: Secondary | ICD-10-CM | POA: Diagnosis not present

## 2016-09-21 DIAGNOSIS — N2581 Secondary hyperparathyroidism of renal origin: Secondary | ICD-10-CM | POA: Diagnosis not present

## 2016-09-23 DIAGNOSIS — Z992 Dependence on renal dialysis: Secondary | ICD-10-CM | POA: Diagnosis not present

## 2016-09-23 DIAGNOSIS — D509 Iron deficiency anemia, unspecified: Secondary | ICD-10-CM | POA: Diagnosis not present

## 2016-09-23 DIAGNOSIS — N2581 Secondary hyperparathyroidism of renal origin: Secondary | ICD-10-CM | POA: Diagnosis not present

## 2016-09-23 DIAGNOSIS — N186 End stage renal disease: Secondary | ICD-10-CM | POA: Diagnosis not present

## 2016-09-25 DIAGNOSIS — D509 Iron deficiency anemia, unspecified: Secondary | ICD-10-CM | POA: Diagnosis not present

## 2016-09-25 DIAGNOSIS — N186 End stage renal disease: Secondary | ICD-10-CM | POA: Diagnosis not present

## 2016-09-25 DIAGNOSIS — N2581 Secondary hyperparathyroidism of renal origin: Secondary | ICD-10-CM | POA: Diagnosis not present

## 2016-09-25 DIAGNOSIS — Z992 Dependence on renal dialysis: Secondary | ICD-10-CM | POA: Diagnosis not present

## 2016-09-28 DIAGNOSIS — N2581 Secondary hyperparathyroidism of renal origin: Secondary | ICD-10-CM | POA: Diagnosis not present

## 2016-09-28 DIAGNOSIS — N186 End stage renal disease: Secondary | ICD-10-CM | POA: Diagnosis not present

## 2016-09-28 DIAGNOSIS — Z992 Dependence on renal dialysis: Secondary | ICD-10-CM | POA: Diagnosis not present

## 2016-09-28 DIAGNOSIS — D509 Iron deficiency anemia, unspecified: Secondary | ICD-10-CM | POA: Diagnosis not present

## 2016-09-30 DIAGNOSIS — D509 Iron deficiency anemia, unspecified: Secondary | ICD-10-CM | POA: Diagnosis not present

## 2016-09-30 DIAGNOSIS — Z992 Dependence on renal dialysis: Secondary | ICD-10-CM | POA: Diagnosis not present

## 2016-09-30 DIAGNOSIS — N2581 Secondary hyperparathyroidism of renal origin: Secondary | ICD-10-CM | POA: Diagnosis not present

## 2016-09-30 DIAGNOSIS — N186 End stage renal disease: Secondary | ICD-10-CM | POA: Diagnosis not present

## 2016-10-02 DIAGNOSIS — N2581 Secondary hyperparathyroidism of renal origin: Secondary | ICD-10-CM | POA: Diagnosis not present

## 2016-10-02 DIAGNOSIS — Z992 Dependence on renal dialysis: Secondary | ICD-10-CM | POA: Diagnosis not present

## 2016-10-02 DIAGNOSIS — N186 End stage renal disease: Secondary | ICD-10-CM | POA: Diagnosis not present

## 2016-10-02 DIAGNOSIS — D509 Iron deficiency anemia, unspecified: Secondary | ICD-10-CM | POA: Diagnosis not present

## 2016-10-05 DIAGNOSIS — N186 End stage renal disease: Secondary | ICD-10-CM | POA: Diagnosis not present

## 2016-10-05 DIAGNOSIS — D509 Iron deficiency anemia, unspecified: Secondary | ICD-10-CM | POA: Diagnosis not present

## 2016-10-05 DIAGNOSIS — Z992 Dependence on renal dialysis: Secondary | ICD-10-CM | POA: Diagnosis not present

## 2016-10-05 DIAGNOSIS — N2581 Secondary hyperparathyroidism of renal origin: Secondary | ICD-10-CM | POA: Diagnosis not present

## 2016-10-07 DIAGNOSIS — N2581 Secondary hyperparathyroidism of renal origin: Secondary | ICD-10-CM | POA: Diagnosis not present

## 2016-10-07 DIAGNOSIS — D509 Iron deficiency anemia, unspecified: Secondary | ICD-10-CM | POA: Diagnosis not present

## 2016-10-07 DIAGNOSIS — Z992 Dependence on renal dialysis: Secondary | ICD-10-CM | POA: Diagnosis not present

## 2016-10-07 DIAGNOSIS — N186 End stage renal disease: Secondary | ICD-10-CM | POA: Diagnosis not present

## 2016-10-09 DIAGNOSIS — Z992 Dependence on renal dialysis: Secondary | ICD-10-CM | POA: Diagnosis not present

## 2016-10-09 DIAGNOSIS — N186 End stage renal disease: Secondary | ICD-10-CM | POA: Diagnosis not present

## 2016-10-09 DIAGNOSIS — N2581 Secondary hyperparathyroidism of renal origin: Secondary | ICD-10-CM | POA: Diagnosis not present

## 2016-10-09 DIAGNOSIS — D509 Iron deficiency anemia, unspecified: Secondary | ICD-10-CM | POA: Diagnosis not present

## 2016-10-12 DIAGNOSIS — N186 End stage renal disease: Secondary | ICD-10-CM | POA: Diagnosis not present

## 2016-10-12 DIAGNOSIS — D509 Iron deficiency anemia, unspecified: Secondary | ICD-10-CM | POA: Diagnosis not present

## 2016-10-12 DIAGNOSIS — Z992 Dependence on renal dialysis: Secondary | ICD-10-CM | POA: Diagnosis not present

## 2016-10-12 DIAGNOSIS — N2581 Secondary hyperparathyroidism of renal origin: Secondary | ICD-10-CM | POA: Diagnosis not present

## 2016-10-14 DIAGNOSIS — N186 End stage renal disease: Secondary | ICD-10-CM | POA: Diagnosis not present

## 2016-10-14 DIAGNOSIS — D509 Iron deficiency anemia, unspecified: Secondary | ICD-10-CM | POA: Diagnosis not present

## 2016-10-14 DIAGNOSIS — Z992 Dependence on renal dialysis: Secondary | ICD-10-CM | POA: Diagnosis not present

## 2016-10-14 DIAGNOSIS — N2581 Secondary hyperparathyroidism of renal origin: Secondary | ICD-10-CM | POA: Diagnosis not present

## 2016-10-16 DIAGNOSIS — N186 End stage renal disease: Secondary | ICD-10-CM | POA: Diagnosis not present

## 2016-10-16 DIAGNOSIS — N2581 Secondary hyperparathyroidism of renal origin: Secondary | ICD-10-CM | POA: Diagnosis not present

## 2016-10-16 DIAGNOSIS — Z992 Dependence on renal dialysis: Secondary | ICD-10-CM | POA: Diagnosis not present

## 2016-10-16 DIAGNOSIS — D509 Iron deficiency anemia, unspecified: Secondary | ICD-10-CM | POA: Diagnosis not present

## 2016-10-19 DIAGNOSIS — N2581 Secondary hyperparathyroidism of renal origin: Secondary | ICD-10-CM | POA: Diagnosis not present

## 2016-10-19 DIAGNOSIS — Z992 Dependence on renal dialysis: Secondary | ICD-10-CM | POA: Diagnosis not present

## 2016-10-19 DIAGNOSIS — D509 Iron deficiency anemia, unspecified: Secondary | ICD-10-CM | POA: Diagnosis not present

## 2016-10-19 DIAGNOSIS — N186 End stage renal disease: Secondary | ICD-10-CM | POA: Diagnosis not present

## 2016-10-21 DIAGNOSIS — N2581 Secondary hyperparathyroidism of renal origin: Secondary | ICD-10-CM | POA: Diagnosis not present

## 2016-10-21 DIAGNOSIS — Z992 Dependence on renal dialysis: Secondary | ICD-10-CM | POA: Diagnosis not present

## 2016-10-21 DIAGNOSIS — N186 End stage renal disease: Secondary | ICD-10-CM | POA: Diagnosis not present

## 2016-10-21 DIAGNOSIS — D509 Iron deficiency anemia, unspecified: Secondary | ICD-10-CM | POA: Diagnosis not present

## 2016-10-23 DIAGNOSIS — N186 End stage renal disease: Secondary | ICD-10-CM | POA: Diagnosis not present

## 2016-10-23 DIAGNOSIS — D509 Iron deficiency anemia, unspecified: Secondary | ICD-10-CM | POA: Diagnosis not present

## 2016-10-23 DIAGNOSIS — N2581 Secondary hyperparathyroidism of renal origin: Secondary | ICD-10-CM | POA: Diagnosis not present

## 2016-10-23 DIAGNOSIS — Z992 Dependence on renal dialysis: Secondary | ICD-10-CM | POA: Diagnosis not present

## 2016-10-26 DIAGNOSIS — N2581 Secondary hyperparathyroidism of renal origin: Secondary | ICD-10-CM | POA: Diagnosis not present

## 2016-10-26 DIAGNOSIS — N186 End stage renal disease: Secondary | ICD-10-CM | POA: Diagnosis not present

## 2016-10-26 DIAGNOSIS — Z992 Dependence on renal dialysis: Secondary | ICD-10-CM | POA: Diagnosis not present

## 2016-10-26 DIAGNOSIS — D509 Iron deficiency anemia, unspecified: Secondary | ICD-10-CM | POA: Diagnosis not present

## 2016-10-28 DIAGNOSIS — D509 Iron deficiency anemia, unspecified: Secondary | ICD-10-CM | POA: Diagnosis not present

## 2016-10-28 DIAGNOSIS — Z992 Dependence on renal dialysis: Secondary | ICD-10-CM | POA: Diagnosis not present

## 2016-10-28 DIAGNOSIS — N2581 Secondary hyperparathyroidism of renal origin: Secondary | ICD-10-CM | POA: Diagnosis not present

## 2016-10-28 DIAGNOSIS — N186 End stage renal disease: Secondary | ICD-10-CM | POA: Diagnosis not present

## 2016-10-30 DIAGNOSIS — N186 End stage renal disease: Secondary | ICD-10-CM | POA: Diagnosis not present

## 2016-10-30 DIAGNOSIS — Z992 Dependence on renal dialysis: Secondary | ICD-10-CM | POA: Diagnosis not present

## 2016-10-30 DIAGNOSIS — D509 Iron deficiency anemia, unspecified: Secondary | ICD-10-CM | POA: Diagnosis not present

## 2016-10-30 DIAGNOSIS — N2581 Secondary hyperparathyroidism of renal origin: Secondary | ICD-10-CM | POA: Diagnosis not present

## 2016-11-01 DIAGNOSIS — Z992 Dependence on renal dialysis: Secondary | ICD-10-CM | POA: Diagnosis not present

## 2016-11-01 DIAGNOSIS — N2581 Secondary hyperparathyroidism of renal origin: Secondary | ICD-10-CM | POA: Diagnosis not present

## 2016-11-01 DIAGNOSIS — N186 End stage renal disease: Secondary | ICD-10-CM | POA: Diagnosis not present

## 2016-11-01 DIAGNOSIS — D509 Iron deficiency anemia, unspecified: Secondary | ICD-10-CM | POA: Diagnosis not present

## 2016-11-03 DIAGNOSIS — N2581 Secondary hyperparathyroidism of renal origin: Secondary | ICD-10-CM | POA: Diagnosis not present

## 2016-11-03 DIAGNOSIS — N186 End stage renal disease: Secondary | ICD-10-CM | POA: Diagnosis not present

## 2016-11-03 DIAGNOSIS — Z992 Dependence on renal dialysis: Secondary | ICD-10-CM | POA: Diagnosis not present

## 2016-11-03 DIAGNOSIS — D509 Iron deficiency anemia, unspecified: Secondary | ICD-10-CM | POA: Diagnosis not present

## 2016-11-04 DIAGNOSIS — Z992 Dependence on renal dialysis: Secondary | ICD-10-CM | POA: Diagnosis not present

## 2016-11-04 DIAGNOSIS — N186 End stage renal disease: Secondary | ICD-10-CM | POA: Diagnosis not present

## 2016-11-05 DIAGNOSIS — N2581 Secondary hyperparathyroidism of renal origin: Secondary | ICD-10-CM | POA: Diagnosis not present

## 2016-11-05 DIAGNOSIS — N186 End stage renal disease: Secondary | ICD-10-CM | POA: Diagnosis not present

## 2016-11-05 DIAGNOSIS — Z992 Dependence on renal dialysis: Secondary | ICD-10-CM | POA: Diagnosis not present

## 2016-11-05 DIAGNOSIS — D509 Iron deficiency anemia, unspecified: Secondary | ICD-10-CM | POA: Diagnosis not present

## 2016-11-08 DIAGNOSIS — D509 Iron deficiency anemia, unspecified: Secondary | ICD-10-CM | POA: Diagnosis not present

## 2016-11-08 DIAGNOSIS — Z992 Dependence on renal dialysis: Secondary | ICD-10-CM | POA: Diagnosis not present

## 2016-11-08 DIAGNOSIS — N186 End stage renal disease: Secondary | ICD-10-CM | POA: Diagnosis not present

## 2016-11-08 DIAGNOSIS — N2581 Secondary hyperparathyroidism of renal origin: Secondary | ICD-10-CM | POA: Diagnosis not present

## 2016-11-10 DIAGNOSIS — D509 Iron deficiency anemia, unspecified: Secondary | ICD-10-CM | POA: Diagnosis not present

## 2016-11-10 DIAGNOSIS — N2581 Secondary hyperparathyroidism of renal origin: Secondary | ICD-10-CM | POA: Diagnosis not present

## 2016-11-10 DIAGNOSIS — Z992 Dependence on renal dialysis: Secondary | ICD-10-CM | POA: Diagnosis not present

## 2016-11-10 DIAGNOSIS — N186 End stage renal disease: Secondary | ICD-10-CM | POA: Diagnosis not present

## 2016-11-12 DIAGNOSIS — N2581 Secondary hyperparathyroidism of renal origin: Secondary | ICD-10-CM | POA: Diagnosis not present

## 2016-11-12 DIAGNOSIS — D509 Iron deficiency anemia, unspecified: Secondary | ICD-10-CM | POA: Diagnosis not present

## 2016-11-12 DIAGNOSIS — Z992 Dependence on renal dialysis: Secondary | ICD-10-CM | POA: Diagnosis not present

## 2016-11-12 DIAGNOSIS — N186 End stage renal disease: Secondary | ICD-10-CM | POA: Diagnosis not present

## 2016-11-15 DIAGNOSIS — Z992 Dependence on renal dialysis: Secondary | ICD-10-CM | POA: Diagnosis not present

## 2016-11-15 DIAGNOSIS — N186 End stage renal disease: Secondary | ICD-10-CM | POA: Diagnosis not present

## 2016-11-15 DIAGNOSIS — N2581 Secondary hyperparathyroidism of renal origin: Secondary | ICD-10-CM | POA: Diagnosis not present

## 2016-11-15 DIAGNOSIS — D509 Iron deficiency anemia, unspecified: Secondary | ICD-10-CM | POA: Diagnosis not present

## 2016-11-19 DIAGNOSIS — D509 Iron deficiency anemia, unspecified: Secondary | ICD-10-CM | POA: Diagnosis not present

## 2016-11-19 DIAGNOSIS — Z992 Dependence on renal dialysis: Secondary | ICD-10-CM | POA: Diagnosis not present

## 2016-11-19 DIAGNOSIS — N186 End stage renal disease: Secondary | ICD-10-CM | POA: Diagnosis not present

## 2016-11-19 DIAGNOSIS — N2581 Secondary hyperparathyroidism of renal origin: Secondary | ICD-10-CM | POA: Diagnosis not present

## 2016-11-22 DIAGNOSIS — D509 Iron deficiency anemia, unspecified: Secondary | ICD-10-CM | POA: Diagnosis not present

## 2016-11-22 DIAGNOSIS — Z992 Dependence on renal dialysis: Secondary | ICD-10-CM | POA: Diagnosis not present

## 2016-11-22 DIAGNOSIS — N186 End stage renal disease: Secondary | ICD-10-CM | POA: Diagnosis not present

## 2016-11-22 DIAGNOSIS — N2581 Secondary hyperparathyroidism of renal origin: Secondary | ICD-10-CM | POA: Diagnosis not present

## 2016-11-24 DIAGNOSIS — D509 Iron deficiency anemia, unspecified: Secondary | ICD-10-CM | POA: Diagnosis not present

## 2016-11-24 DIAGNOSIS — Z992 Dependence on renal dialysis: Secondary | ICD-10-CM | POA: Diagnosis not present

## 2016-11-24 DIAGNOSIS — N186 End stage renal disease: Secondary | ICD-10-CM | POA: Diagnosis not present

## 2016-11-24 DIAGNOSIS — N2581 Secondary hyperparathyroidism of renal origin: Secondary | ICD-10-CM | POA: Diagnosis not present

## 2016-11-26 DIAGNOSIS — D509 Iron deficiency anemia, unspecified: Secondary | ICD-10-CM | POA: Diagnosis not present

## 2016-11-26 DIAGNOSIS — N2581 Secondary hyperparathyroidism of renal origin: Secondary | ICD-10-CM | POA: Diagnosis not present

## 2016-11-26 DIAGNOSIS — Z992 Dependence on renal dialysis: Secondary | ICD-10-CM | POA: Diagnosis not present

## 2016-11-26 DIAGNOSIS — N186 End stage renal disease: Secondary | ICD-10-CM | POA: Diagnosis not present

## 2016-11-28 DIAGNOSIS — N2581 Secondary hyperparathyroidism of renal origin: Secondary | ICD-10-CM | POA: Diagnosis not present

## 2016-11-28 DIAGNOSIS — D509 Iron deficiency anemia, unspecified: Secondary | ICD-10-CM | POA: Diagnosis not present

## 2016-11-28 DIAGNOSIS — N186 End stage renal disease: Secondary | ICD-10-CM | POA: Diagnosis not present

## 2016-11-28 DIAGNOSIS — Z992 Dependence on renal dialysis: Secondary | ICD-10-CM | POA: Diagnosis not present

## 2016-12-01 DIAGNOSIS — N186 End stage renal disease: Secondary | ICD-10-CM | POA: Diagnosis not present

## 2016-12-01 DIAGNOSIS — N2581 Secondary hyperparathyroidism of renal origin: Secondary | ICD-10-CM | POA: Diagnosis not present

## 2016-12-01 DIAGNOSIS — Z992 Dependence on renal dialysis: Secondary | ICD-10-CM | POA: Diagnosis not present

## 2016-12-01 DIAGNOSIS — D509 Iron deficiency anemia, unspecified: Secondary | ICD-10-CM | POA: Diagnosis not present

## 2016-12-03 DIAGNOSIS — N186 End stage renal disease: Secondary | ICD-10-CM | POA: Diagnosis not present

## 2016-12-03 DIAGNOSIS — D509 Iron deficiency anemia, unspecified: Secondary | ICD-10-CM | POA: Diagnosis not present

## 2016-12-03 DIAGNOSIS — Z992 Dependence on renal dialysis: Secondary | ICD-10-CM | POA: Diagnosis not present

## 2016-12-03 DIAGNOSIS — N2581 Secondary hyperparathyroidism of renal origin: Secondary | ICD-10-CM | POA: Diagnosis not present

## 2016-12-05 DIAGNOSIS — N186 End stage renal disease: Secondary | ICD-10-CM | POA: Diagnosis not present

## 2016-12-05 DIAGNOSIS — Z992 Dependence on renal dialysis: Secondary | ICD-10-CM | POA: Diagnosis not present

## 2016-12-05 DIAGNOSIS — D509 Iron deficiency anemia, unspecified: Secondary | ICD-10-CM | POA: Diagnosis not present

## 2016-12-05 DIAGNOSIS — N2581 Secondary hyperparathyroidism of renal origin: Secondary | ICD-10-CM | POA: Diagnosis not present

## 2016-12-08 DIAGNOSIS — D509 Iron deficiency anemia, unspecified: Secondary | ICD-10-CM | POA: Diagnosis not present

## 2016-12-08 DIAGNOSIS — N2581 Secondary hyperparathyroidism of renal origin: Secondary | ICD-10-CM | POA: Diagnosis not present

## 2016-12-08 DIAGNOSIS — N186 End stage renal disease: Secondary | ICD-10-CM | POA: Diagnosis not present

## 2016-12-08 DIAGNOSIS — Z992 Dependence on renal dialysis: Secondary | ICD-10-CM | POA: Diagnosis not present

## 2016-12-11 DIAGNOSIS — N186 End stage renal disease: Secondary | ICD-10-CM | POA: Diagnosis not present

## 2016-12-11 DIAGNOSIS — Z992 Dependence on renal dialysis: Secondary | ICD-10-CM | POA: Diagnosis not present

## 2016-12-11 DIAGNOSIS — D509 Iron deficiency anemia, unspecified: Secondary | ICD-10-CM | POA: Diagnosis not present

## 2016-12-11 DIAGNOSIS — N2581 Secondary hyperparathyroidism of renal origin: Secondary | ICD-10-CM | POA: Diagnosis not present

## 2016-12-13 DIAGNOSIS — Z992 Dependence on renal dialysis: Secondary | ICD-10-CM | POA: Diagnosis not present

## 2016-12-13 DIAGNOSIS — N2581 Secondary hyperparathyroidism of renal origin: Secondary | ICD-10-CM | POA: Diagnosis not present

## 2016-12-13 DIAGNOSIS — N186 End stage renal disease: Secondary | ICD-10-CM | POA: Diagnosis not present

## 2016-12-13 DIAGNOSIS — D509 Iron deficiency anemia, unspecified: Secondary | ICD-10-CM | POA: Diagnosis not present

## 2016-12-15 DIAGNOSIS — N2581 Secondary hyperparathyroidism of renal origin: Secondary | ICD-10-CM | POA: Diagnosis not present

## 2016-12-15 DIAGNOSIS — N186 End stage renal disease: Secondary | ICD-10-CM | POA: Diagnosis not present

## 2016-12-15 DIAGNOSIS — Z992 Dependence on renal dialysis: Secondary | ICD-10-CM | POA: Diagnosis not present

## 2016-12-15 DIAGNOSIS — D509 Iron deficiency anemia, unspecified: Secondary | ICD-10-CM | POA: Diagnosis not present

## 2016-12-20 DIAGNOSIS — N186 End stage renal disease: Secondary | ICD-10-CM | POA: Diagnosis not present

## 2016-12-20 DIAGNOSIS — D509 Iron deficiency anemia, unspecified: Secondary | ICD-10-CM | POA: Diagnosis not present

## 2016-12-20 DIAGNOSIS — N2581 Secondary hyperparathyroidism of renal origin: Secondary | ICD-10-CM | POA: Diagnosis not present

## 2016-12-20 DIAGNOSIS — Z992 Dependence on renal dialysis: Secondary | ICD-10-CM | POA: Diagnosis not present

## 2016-12-22 DIAGNOSIS — D509 Iron deficiency anemia, unspecified: Secondary | ICD-10-CM | POA: Diagnosis not present

## 2016-12-22 DIAGNOSIS — N186 End stage renal disease: Secondary | ICD-10-CM | POA: Diagnosis not present

## 2016-12-22 DIAGNOSIS — N2581 Secondary hyperparathyroidism of renal origin: Secondary | ICD-10-CM | POA: Diagnosis not present

## 2016-12-22 DIAGNOSIS — Z992 Dependence on renal dialysis: Secondary | ICD-10-CM | POA: Diagnosis not present

## 2016-12-24 DIAGNOSIS — N186 End stage renal disease: Secondary | ICD-10-CM | POA: Diagnosis not present

## 2016-12-24 DIAGNOSIS — Z992 Dependence on renal dialysis: Secondary | ICD-10-CM | POA: Diagnosis not present

## 2016-12-24 DIAGNOSIS — D509 Iron deficiency anemia, unspecified: Secondary | ICD-10-CM | POA: Diagnosis not present

## 2016-12-24 DIAGNOSIS — N2581 Secondary hyperparathyroidism of renal origin: Secondary | ICD-10-CM | POA: Diagnosis not present

## 2016-12-27 DIAGNOSIS — Z992 Dependence on renal dialysis: Secondary | ICD-10-CM | POA: Diagnosis not present

## 2016-12-27 DIAGNOSIS — N186 End stage renal disease: Secondary | ICD-10-CM | POA: Diagnosis not present

## 2016-12-27 DIAGNOSIS — N2581 Secondary hyperparathyroidism of renal origin: Secondary | ICD-10-CM | POA: Diagnosis not present

## 2016-12-27 DIAGNOSIS — D509 Iron deficiency anemia, unspecified: Secondary | ICD-10-CM | POA: Diagnosis not present

## 2016-12-29 DIAGNOSIS — N2581 Secondary hyperparathyroidism of renal origin: Secondary | ICD-10-CM | POA: Diagnosis not present

## 2016-12-29 DIAGNOSIS — N186 End stage renal disease: Secondary | ICD-10-CM | POA: Diagnosis not present

## 2016-12-29 DIAGNOSIS — Z992 Dependence on renal dialysis: Secondary | ICD-10-CM | POA: Diagnosis not present

## 2016-12-29 DIAGNOSIS — D509 Iron deficiency anemia, unspecified: Secondary | ICD-10-CM | POA: Diagnosis not present

## 2016-12-31 DIAGNOSIS — Z992 Dependence on renal dialysis: Secondary | ICD-10-CM | POA: Diagnosis not present

## 2016-12-31 DIAGNOSIS — N186 End stage renal disease: Secondary | ICD-10-CM | POA: Diagnosis not present

## 2016-12-31 DIAGNOSIS — N2581 Secondary hyperparathyroidism of renal origin: Secondary | ICD-10-CM | POA: Diagnosis not present

## 2016-12-31 DIAGNOSIS — D509 Iron deficiency anemia, unspecified: Secondary | ICD-10-CM | POA: Diagnosis not present

## 2017-01-03 DIAGNOSIS — Z992 Dependence on renal dialysis: Secondary | ICD-10-CM | POA: Diagnosis not present

## 2017-01-03 DIAGNOSIS — N186 End stage renal disease: Secondary | ICD-10-CM | POA: Diagnosis not present

## 2017-01-03 DIAGNOSIS — N2581 Secondary hyperparathyroidism of renal origin: Secondary | ICD-10-CM | POA: Diagnosis not present

## 2017-01-03 DIAGNOSIS — D509 Iron deficiency anemia, unspecified: Secondary | ICD-10-CM | POA: Diagnosis not present

## 2017-01-05 DIAGNOSIS — N186 End stage renal disease: Secondary | ICD-10-CM | POA: Diagnosis not present

## 2017-01-05 DIAGNOSIS — Z992 Dependence on renal dialysis: Secondary | ICD-10-CM | POA: Diagnosis not present

## 2017-01-05 DIAGNOSIS — D509 Iron deficiency anemia, unspecified: Secondary | ICD-10-CM | POA: Diagnosis not present

## 2017-01-05 DIAGNOSIS — N2581 Secondary hyperparathyroidism of renal origin: Secondary | ICD-10-CM | POA: Diagnosis not present

## 2017-01-07 DIAGNOSIS — Z992 Dependence on renal dialysis: Secondary | ICD-10-CM | POA: Diagnosis not present

## 2017-01-07 DIAGNOSIS — N2581 Secondary hyperparathyroidism of renal origin: Secondary | ICD-10-CM | POA: Diagnosis not present

## 2017-01-07 DIAGNOSIS — D509 Iron deficiency anemia, unspecified: Secondary | ICD-10-CM | POA: Diagnosis not present

## 2017-01-07 DIAGNOSIS — N186 End stage renal disease: Secondary | ICD-10-CM | POA: Diagnosis not present

## 2017-01-10 DIAGNOSIS — Z992 Dependence on renal dialysis: Secondary | ICD-10-CM | POA: Diagnosis not present

## 2017-01-10 DIAGNOSIS — D509 Iron deficiency anemia, unspecified: Secondary | ICD-10-CM | POA: Diagnosis not present

## 2017-01-10 DIAGNOSIS — N186 End stage renal disease: Secondary | ICD-10-CM | POA: Diagnosis not present

## 2017-01-10 DIAGNOSIS — N2581 Secondary hyperparathyroidism of renal origin: Secondary | ICD-10-CM | POA: Diagnosis not present

## 2017-01-12 DIAGNOSIS — Z992 Dependence on renal dialysis: Secondary | ICD-10-CM | POA: Diagnosis not present

## 2017-01-12 DIAGNOSIS — N186 End stage renal disease: Secondary | ICD-10-CM | POA: Diagnosis not present

## 2017-01-12 DIAGNOSIS — D509 Iron deficiency anemia, unspecified: Secondary | ICD-10-CM | POA: Diagnosis not present

## 2017-01-12 DIAGNOSIS — N2581 Secondary hyperparathyroidism of renal origin: Secondary | ICD-10-CM | POA: Diagnosis not present

## 2017-01-14 DIAGNOSIS — D509 Iron deficiency anemia, unspecified: Secondary | ICD-10-CM | POA: Diagnosis not present

## 2017-01-14 DIAGNOSIS — Z992 Dependence on renal dialysis: Secondary | ICD-10-CM | POA: Diagnosis not present

## 2017-01-14 DIAGNOSIS — N186 End stage renal disease: Secondary | ICD-10-CM | POA: Diagnosis not present

## 2017-01-14 DIAGNOSIS — N2581 Secondary hyperparathyroidism of renal origin: Secondary | ICD-10-CM | POA: Diagnosis not present

## 2017-01-17 DIAGNOSIS — Z992 Dependence on renal dialysis: Secondary | ICD-10-CM | POA: Diagnosis not present

## 2017-01-17 DIAGNOSIS — N2581 Secondary hyperparathyroidism of renal origin: Secondary | ICD-10-CM | POA: Diagnosis not present

## 2017-01-17 DIAGNOSIS — D509 Iron deficiency anemia, unspecified: Secondary | ICD-10-CM | POA: Diagnosis not present

## 2017-01-17 DIAGNOSIS — N186 End stage renal disease: Secondary | ICD-10-CM | POA: Diagnosis not present

## 2017-01-20 DIAGNOSIS — M1009 Idiopathic gout, multiple sites: Secondary | ICD-10-CM | POA: Diagnosis not present

## 2017-01-20 DIAGNOSIS — I10 Essential (primary) hypertension: Secondary | ICD-10-CM | POA: Diagnosis not present

## 2017-01-20 DIAGNOSIS — N289 Disorder of kidney and ureter, unspecified: Secondary | ICD-10-CM | POA: Diagnosis not present

## 2017-01-20 DIAGNOSIS — G603 Idiopathic progressive neuropathy: Secondary | ICD-10-CM | POA: Diagnosis not present

## 2017-01-20 DIAGNOSIS — G4733 Obstructive sleep apnea (adult) (pediatric): Secondary | ICD-10-CM | POA: Diagnosis not present

## 2017-01-20 DIAGNOSIS — G5610 Other lesions of median nerve, unspecified upper limb: Secondary | ICD-10-CM | POA: Diagnosis not present

## 2017-01-20 DIAGNOSIS — M792 Neuralgia and neuritis, unspecified: Secondary | ICD-10-CM | POA: Diagnosis not present

## 2017-01-20 DIAGNOSIS — Z79899 Other long term (current) drug therapy: Secondary | ICD-10-CM | POA: Diagnosis not present

## 2017-01-20 DIAGNOSIS — M79603 Pain in arm, unspecified: Secondary | ICD-10-CM | POA: Diagnosis not present

## 2017-01-21 DIAGNOSIS — D509 Iron deficiency anemia, unspecified: Secondary | ICD-10-CM | POA: Diagnosis not present

## 2017-01-21 DIAGNOSIS — Z992 Dependence on renal dialysis: Secondary | ICD-10-CM | POA: Diagnosis not present

## 2017-01-21 DIAGNOSIS — N186 End stage renal disease: Secondary | ICD-10-CM | POA: Diagnosis not present

## 2017-01-21 DIAGNOSIS — N2581 Secondary hyperparathyroidism of renal origin: Secondary | ICD-10-CM | POA: Diagnosis not present

## 2017-01-24 DIAGNOSIS — D509 Iron deficiency anemia, unspecified: Secondary | ICD-10-CM | POA: Diagnosis not present

## 2017-01-24 DIAGNOSIS — N186 End stage renal disease: Secondary | ICD-10-CM | POA: Diagnosis not present

## 2017-01-24 DIAGNOSIS — N2581 Secondary hyperparathyroidism of renal origin: Secondary | ICD-10-CM | POA: Diagnosis not present

## 2017-01-24 DIAGNOSIS — Z992 Dependence on renal dialysis: Secondary | ICD-10-CM | POA: Diagnosis not present

## 2017-01-26 DIAGNOSIS — N186 End stage renal disease: Secondary | ICD-10-CM | POA: Diagnosis not present

## 2017-01-26 DIAGNOSIS — N2581 Secondary hyperparathyroidism of renal origin: Secondary | ICD-10-CM | POA: Diagnosis not present

## 2017-01-26 DIAGNOSIS — Z992 Dependence on renal dialysis: Secondary | ICD-10-CM | POA: Diagnosis not present

## 2017-01-26 DIAGNOSIS — D509 Iron deficiency anemia, unspecified: Secondary | ICD-10-CM | POA: Diagnosis not present

## 2017-01-28 DIAGNOSIS — N186 End stage renal disease: Secondary | ICD-10-CM | POA: Diagnosis not present

## 2017-01-28 DIAGNOSIS — D509 Iron deficiency anemia, unspecified: Secondary | ICD-10-CM | POA: Diagnosis not present

## 2017-01-28 DIAGNOSIS — N2581 Secondary hyperparathyroidism of renal origin: Secondary | ICD-10-CM | POA: Diagnosis not present

## 2017-01-28 DIAGNOSIS — Z992 Dependence on renal dialysis: Secondary | ICD-10-CM | POA: Diagnosis not present

## 2017-01-31 DIAGNOSIS — N186 End stage renal disease: Secondary | ICD-10-CM | POA: Diagnosis not present

## 2017-01-31 DIAGNOSIS — D509 Iron deficiency anemia, unspecified: Secondary | ICD-10-CM | POA: Diagnosis not present

## 2017-01-31 DIAGNOSIS — N2581 Secondary hyperparathyroidism of renal origin: Secondary | ICD-10-CM | POA: Diagnosis not present

## 2017-01-31 DIAGNOSIS — Z992 Dependence on renal dialysis: Secondary | ICD-10-CM | POA: Diagnosis not present

## 2017-02-02 DIAGNOSIS — Z992 Dependence on renal dialysis: Secondary | ICD-10-CM | POA: Diagnosis not present

## 2017-02-02 DIAGNOSIS — N2581 Secondary hyperparathyroidism of renal origin: Secondary | ICD-10-CM | POA: Diagnosis not present

## 2017-02-02 DIAGNOSIS — D509 Iron deficiency anemia, unspecified: Secondary | ICD-10-CM | POA: Diagnosis not present

## 2017-02-02 DIAGNOSIS — N186 End stage renal disease: Secondary | ICD-10-CM | POA: Diagnosis not present

## 2017-02-04 DIAGNOSIS — D509 Iron deficiency anemia, unspecified: Secondary | ICD-10-CM | POA: Diagnosis not present

## 2017-02-04 DIAGNOSIS — N186 End stage renal disease: Secondary | ICD-10-CM | POA: Diagnosis not present

## 2017-02-04 DIAGNOSIS — Z992 Dependence on renal dialysis: Secondary | ICD-10-CM | POA: Diagnosis not present

## 2017-02-04 DIAGNOSIS — N2581 Secondary hyperparathyroidism of renal origin: Secondary | ICD-10-CM | POA: Diagnosis not present

## 2017-02-07 DIAGNOSIS — N186 End stage renal disease: Secondary | ICD-10-CM | POA: Diagnosis not present

## 2017-02-07 DIAGNOSIS — D509 Iron deficiency anemia, unspecified: Secondary | ICD-10-CM | POA: Diagnosis not present

## 2017-02-07 DIAGNOSIS — N2581 Secondary hyperparathyroidism of renal origin: Secondary | ICD-10-CM | POA: Diagnosis not present

## 2017-02-07 DIAGNOSIS — Z992 Dependence on renal dialysis: Secondary | ICD-10-CM | POA: Diagnosis not present

## 2017-02-09 DIAGNOSIS — Z992 Dependence on renal dialysis: Secondary | ICD-10-CM | POA: Diagnosis not present

## 2017-02-09 DIAGNOSIS — N2581 Secondary hyperparathyroidism of renal origin: Secondary | ICD-10-CM | POA: Diagnosis not present

## 2017-02-09 DIAGNOSIS — D509 Iron deficiency anemia, unspecified: Secondary | ICD-10-CM | POA: Diagnosis not present

## 2017-02-09 DIAGNOSIS — N186 End stage renal disease: Secondary | ICD-10-CM | POA: Diagnosis not present

## 2017-02-10 DIAGNOSIS — Z6831 Body mass index (BMI) 31.0-31.9, adult: Secondary | ICD-10-CM | POA: Diagnosis not present

## 2017-02-10 DIAGNOSIS — Z0000 Encounter for general adult medical examination without abnormal findings: Secondary | ICD-10-CM | POA: Diagnosis not present

## 2017-02-10 DIAGNOSIS — I120 Hypertensive chronic kidney disease with stage 5 chronic kidney disease or end stage renal disease: Secondary | ICD-10-CM | POA: Diagnosis not present

## 2017-02-11 DIAGNOSIS — N2581 Secondary hyperparathyroidism of renal origin: Secondary | ICD-10-CM | POA: Diagnosis not present

## 2017-02-11 DIAGNOSIS — N186 End stage renal disease: Secondary | ICD-10-CM | POA: Diagnosis not present

## 2017-02-11 DIAGNOSIS — D509 Iron deficiency anemia, unspecified: Secondary | ICD-10-CM | POA: Diagnosis not present

## 2017-02-11 DIAGNOSIS — Z992 Dependence on renal dialysis: Secondary | ICD-10-CM | POA: Diagnosis not present

## 2017-02-14 DIAGNOSIS — N186 End stage renal disease: Secondary | ICD-10-CM | POA: Diagnosis not present

## 2017-02-14 DIAGNOSIS — N2581 Secondary hyperparathyroidism of renal origin: Secondary | ICD-10-CM | POA: Diagnosis not present

## 2017-02-14 DIAGNOSIS — D509 Iron deficiency anemia, unspecified: Secondary | ICD-10-CM | POA: Diagnosis not present

## 2017-02-14 DIAGNOSIS — Z992 Dependence on renal dialysis: Secondary | ICD-10-CM | POA: Diagnosis not present

## 2017-02-16 DIAGNOSIS — N2581 Secondary hyperparathyroidism of renal origin: Secondary | ICD-10-CM | POA: Diagnosis not present

## 2017-02-16 DIAGNOSIS — Z992 Dependence on renal dialysis: Secondary | ICD-10-CM | POA: Diagnosis not present

## 2017-02-16 DIAGNOSIS — N186 End stage renal disease: Secondary | ICD-10-CM | POA: Diagnosis not present

## 2017-02-16 DIAGNOSIS — D509 Iron deficiency anemia, unspecified: Secondary | ICD-10-CM | POA: Diagnosis not present

## 2017-02-18 DIAGNOSIS — Z992 Dependence on renal dialysis: Secondary | ICD-10-CM | POA: Diagnosis not present

## 2017-02-18 DIAGNOSIS — N186 End stage renal disease: Secondary | ICD-10-CM | POA: Diagnosis not present

## 2017-02-18 DIAGNOSIS — N2581 Secondary hyperparathyroidism of renal origin: Secondary | ICD-10-CM | POA: Diagnosis not present

## 2017-02-18 DIAGNOSIS — D509 Iron deficiency anemia, unspecified: Secondary | ICD-10-CM | POA: Diagnosis not present

## 2017-02-21 DIAGNOSIS — Z992 Dependence on renal dialysis: Secondary | ICD-10-CM | POA: Diagnosis not present

## 2017-02-21 DIAGNOSIS — N186 End stage renal disease: Secondary | ICD-10-CM | POA: Diagnosis not present

## 2017-02-21 DIAGNOSIS — D509 Iron deficiency anemia, unspecified: Secondary | ICD-10-CM | POA: Diagnosis not present

## 2017-02-21 DIAGNOSIS — N2581 Secondary hyperparathyroidism of renal origin: Secondary | ICD-10-CM | POA: Diagnosis not present

## 2017-02-22 DIAGNOSIS — G4733 Obstructive sleep apnea (adult) (pediatric): Secondary | ICD-10-CM | POA: Diagnosis not present

## 2017-02-22 DIAGNOSIS — D509 Iron deficiency anemia, unspecified: Secondary | ICD-10-CM | POA: Diagnosis not present

## 2017-02-22 DIAGNOSIS — Z992 Dependence on renal dialysis: Secondary | ICD-10-CM | POA: Diagnosis not present

## 2017-02-22 DIAGNOSIS — M1009 Idiopathic gout, multiple sites: Secondary | ICD-10-CM | POA: Diagnosis not present

## 2017-02-22 DIAGNOSIS — G5610 Other lesions of median nerve, unspecified upper limb: Secondary | ICD-10-CM | POA: Diagnosis not present

## 2017-02-22 DIAGNOSIS — I10 Essential (primary) hypertension: Secondary | ICD-10-CM | POA: Diagnosis not present

## 2017-02-22 DIAGNOSIS — Z79899 Other long term (current) drug therapy: Secondary | ICD-10-CM | POA: Diagnosis not present

## 2017-02-22 DIAGNOSIS — N186 End stage renal disease: Secondary | ICD-10-CM | POA: Diagnosis not present

## 2017-02-22 DIAGNOSIS — M792 Neuralgia and neuritis, unspecified: Secondary | ICD-10-CM | POA: Diagnosis not present

## 2017-02-22 DIAGNOSIS — N289 Disorder of kidney and ureter, unspecified: Secondary | ICD-10-CM | POA: Diagnosis not present

## 2017-02-22 DIAGNOSIS — N2581 Secondary hyperparathyroidism of renal origin: Secondary | ICD-10-CM | POA: Diagnosis not present

## 2017-02-22 DIAGNOSIS — M79603 Pain in arm, unspecified: Secondary | ICD-10-CM | POA: Diagnosis not present

## 2017-02-22 DIAGNOSIS — G603 Idiopathic progressive neuropathy: Secondary | ICD-10-CM | POA: Diagnosis not present

## 2017-02-23 DIAGNOSIS — Z992 Dependence on renal dialysis: Secondary | ICD-10-CM | POA: Diagnosis not present

## 2017-02-23 DIAGNOSIS — D509 Iron deficiency anemia, unspecified: Secondary | ICD-10-CM | POA: Diagnosis not present

## 2017-02-23 DIAGNOSIS — N2581 Secondary hyperparathyroidism of renal origin: Secondary | ICD-10-CM | POA: Diagnosis not present

## 2017-02-23 DIAGNOSIS — N186 End stage renal disease: Secondary | ICD-10-CM | POA: Diagnosis not present

## 2017-02-25 DIAGNOSIS — N2581 Secondary hyperparathyroidism of renal origin: Secondary | ICD-10-CM | POA: Diagnosis not present

## 2017-02-25 DIAGNOSIS — D509 Iron deficiency anemia, unspecified: Secondary | ICD-10-CM | POA: Diagnosis not present

## 2017-02-25 DIAGNOSIS — N186 End stage renal disease: Secondary | ICD-10-CM | POA: Diagnosis not present

## 2017-02-25 DIAGNOSIS — Z992 Dependence on renal dialysis: Secondary | ICD-10-CM | POA: Diagnosis not present

## 2017-02-28 DIAGNOSIS — N2581 Secondary hyperparathyroidism of renal origin: Secondary | ICD-10-CM | POA: Diagnosis not present

## 2017-02-28 DIAGNOSIS — D509 Iron deficiency anemia, unspecified: Secondary | ICD-10-CM | POA: Diagnosis not present

## 2017-02-28 DIAGNOSIS — N186 End stage renal disease: Secondary | ICD-10-CM | POA: Diagnosis not present

## 2017-02-28 DIAGNOSIS — Z992 Dependence on renal dialysis: Secondary | ICD-10-CM | POA: Diagnosis not present

## 2017-03-02 DIAGNOSIS — N2581 Secondary hyperparathyroidism of renal origin: Secondary | ICD-10-CM | POA: Diagnosis not present

## 2017-03-02 DIAGNOSIS — D509 Iron deficiency anemia, unspecified: Secondary | ICD-10-CM | POA: Diagnosis not present

## 2017-03-02 DIAGNOSIS — Z992 Dependence on renal dialysis: Secondary | ICD-10-CM | POA: Diagnosis not present

## 2017-03-02 DIAGNOSIS — N186 End stage renal disease: Secondary | ICD-10-CM | POA: Diagnosis not present

## 2017-03-04 DIAGNOSIS — D509 Iron deficiency anemia, unspecified: Secondary | ICD-10-CM | POA: Diagnosis not present

## 2017-03-04 DIAGNOSIS — N2581 Secondary hyperparathyroidism of renal origin: Secondary | ICD-10-CM | POA: Diagnosis not present

## 2017-03-04 DIAGNOSIS — N186 End stage renal disease: Secondary | ICD-10-CM | POA: Diagnosis not present

## 2017-03-04 DIAGNOSIS — Z992 Dependence on renal dialysis: Secondary | ICD-10-CM | POA: Diagnosis not present

## 2017-03-05 DIAGNOSIS — Z992 Dependence on renal dialysis: Secondary | ICD-10-CM | POA: Diagnosis not present

## 2017-03-05 DIAGNOSIS — N186 End stage renal disease: Secondary | ICD-10-CM | POA: Diagnosis not present

## 2017-03-06 DIAGNOSIS — N2581 Secondary hyperparathyroidism of renal origin: Secondary | ICD-10-CM | POA: Diagnosis not present

## 2017-03-06 DIAGNOSIS — N186 End stage renal disease: Secondary | ICD-10-CM | POA: Diagnosis not present

## 2017-03-06 DIAGNOSIS — Z992 Dependence on renal dialysis: Secondary | ICD-10-CM | POA: Diagnosis not present

## 2017-03-06 DIAGNOSIS — D509 Iron deficiency anemia, unspecified: Secondary | ICD-10-CM | POA: Diagnosis not present

## 2017-03-07 DIAGNOSIS — N186 End stage renal disease: Secondary | ICD-10-CM | POA: Diagnosis not present

## 2017-03-07 DIAGNOSIS — D509 Iron deficiency anemia, unspecified: Secondary | ICD-10-CM | POA: Diagnosis not present

## 2017-03-07 DIAGNOSIS — N2581 Secondary hyperparathyroidism of renal origin: Secondary | ICD-10-CM | POA: Diagnosis not present

## 2017-03-07 DIAGNOSIS — Z992 Dependence on renal dialysis: Secondary | ICD-10-CM | POA: Diagnosis not present

## 2017-03-09 ENCOUNTER — Other Ambulatory Visit: Payer: Self-pay | Admitting: *Deleted

## 2017-03-09 DIAGNOSIS — T82590A Other mechanical complication of surgically created arteriovenous fistula, initial encounter: Secondary | ICD-10-CM

## 2017-03-09 DIAGNOSIS — N2581 Secondary hyperparathyroidism of renal origin: Secondary | ICD-10-CM | POA: Diagnosis not present

## 2017-03-09 DIAGNOSIS — D509 Iron deficiency anemia, unspecified: Secondary | ICD-10-CM | POA: Diagnosis not present

## 2017-03-09 DIAGNOSIS — N186 End stage renal disease: Secondary | ICD-10-CM | POA: Diagnosis not present

## 2017-03-09 DIAGNOSIS — Z992 Dependence on renal dialysis: Secondary | ICD-10-CM | POA: Diagnosis not present

## 2017-03-10 ENCOUNTER — Encounter: Payer: Self-pay | Admitting: Vascular Surgery

## 2017-03-11 DIAGNOSIS — Z992 Dependence on renal dialysis: Secondary | ICD-10-CM | POA: Diagnosis not present

## 2017-03-11 DIAGNOSIS — N186 End stage renal disease: Secondary | ICD-10-CM | POA: Diagnosis not present

## 2017-03-11 DIAGNOSIS — D509 Iron deficiency anemia, unspecified: Secondary | ICD-10-CM | POA: Diagnosis not present

## 2017-03-11 DIAGNOSIS — N2581 Secondary hyperparathyroidism of renal origin: Secondary | ICD-10-CM | POA: Diagnosis not present

## 2017-03-14 DIAGNOSIS — Z992 Dependence on renal dialysis: Secondary | ICD-10-CM | POA: Diagnosis not present

## 2017-03-14 DIAGNOSIS — N2581 Secondary hyperparathyroidism of renal origin: Secondary | ICD-10-CM | POA: Diagnosis not present

## 2017-03-14 DIAGNOSIS — N186 End stage renal disease: Secondary | ICD-10-CM | POA: Diagnosis not present

## 2017-03-14 DIAGNOSIS — D509 Iron deficiency anemia, unspecified: Secondary | ICD-10-CM | POA: Diagnosis not present

## 2017-03-16 DIAGNOSIS — N186 End stage renal disease: Secondary | ICD-10-CM | POA: Diagnosis not present

## 2017-03-16 DIAGNOSIS — N2581 Secondary hyperparathyroidism of renal origin: Secondary | ICD-10-CM | POA: Diagnosis not present

## 2017-03-16 DIAGNOSIS — Z992 Dependence on renal dialysis: Secondary | ICD-10-CM | POA: Diagnosis not present

## 2017-03-16 DIAGNOSIS — D509 Iron deficiency anemia, unspecified: Secondary | ICD-10-CM | POA: Diagnosis not present

## 2017-03-18 DIAGNOSIS — Z992 Dependence on renal dialysis: Secondary | ICD-10-CM | POA: Diagnosis not present

## 2017-03-18 DIAGNOSIS — D509 Iron deficiency anemia, unspecified: Secondary | ICD-10-CM | POA: Diagnosis not present

## 2017-03-18 DIAGNOSIS — N2581 Secondary hyperparathyroidism of renal origin: Secondary | ICD-10-CM | POA: Diagnosis not present

## 2017-03-18 DIAGNOSIS — N186 End stage renal disease: Secondary | ICD-10-CM | POA: Diagnosis not present

## 2017-03-21 DIAGNOSIS — N186 End stage renal disease: Secondary | ICD-10-CM | POA: Diagnosis not present

## 2017-03-21 DIAGNOSIS — D509 Iron deficiency anemia, unspecified: Secondary | ICD-10-CM | POA: Diagnosis not present

## 2017-03-21 DIAGNOSIS — N2581 Secondary hyperparathyroidism of renal origin: Secondary | ICD-10-CM | POA: Diagnosis not present

## 2017-03-21 DIAGNOSIS — Z992 Dependence on renal dialysis: Secondary | ICD-10-CM | POA: Diagnosis not present

## 2017-03-22 ENCOUNTER — Encounter: Payer: Self-pay | Admitting: Vascular Surgery

## 2017-03-22 ENCOUNTER — Ambulatory Visit (HOSPITAL_COMMUNITY)
Admission: RE | Admit: 2017-03-22 | Discharge: 2017-03-22 | Disposition: A | Discharge status: Home / Self Care | Payer: Medicare Other | Source: Ambulatory Visit | Attending: Vascular Surgery | Admitting: Vascular Surgery

## 2017-03-22 ENCOUNTER — Ambulatory Visit (INDEPENDENT_AMBULATORY_CARE_PROVIDER_SITE_OTHER): Payer: Medicare Other | Admitting: Vascular Surgery

## 2017-03-22 VITALS — BP 144/89 | HR 82 | Temp 98.4°F | Resp 20 | Ht 68.0 in | Wt 210.1 lb

## 2017-03-22 DIAGNOSIS — N186 End stage renal disease: Secondary | ICD-10-CM

## 2017-03-22 DIAGNOSIS — I82719 Chronic embolism and thrombosis of superficial veins of unspecified upper extremity: Secondary | ICD-10-CM | POA: Diagnosis not present

## 2017-03-22 DIAGNOSIS — Z992 Dependence on renal dialysis: Secondary | ICD-10-CM

## 2017-03-22 DIAGNOSIS — X58XXXA Exposure to other specified factors, initial encounter: Secondary | ICD-10-CM | POA: Insufficient documentation

## 2017-03-22 DIAGNOSIS — T82590A Other mechanical complication of surgically created arteriovenous fistula, initial encounter: Secondary | ICD-10-CM | POA: Insufficient documentation

## 2017-03-22 NOTE — Progress Notes (Signed)
Established Dialysis Access  History of Present Illness  Robert Hays is a 51 y.o. (04/04/1966) male with R RC AVF who presents for re-evaluation for prolonged bleeding.  This patient previous has been seen by Dr. Edilia Bo for bleeding from R RC AVF.  He was scheduled for R arm fistulogram but the patient canceled the procedure.  The patient continues to have prolonged bleeding, requiring 45 minutes to get bleeding under control.  The patient is still able to complete HD session with adequate flow rates.  Past Medical History:  Diagnosis Date  . Anemia   . Asthma   . Depression    pt denies  . Dysrhythmia    "skipped beats"  . Hypertension   . Hypothyroidism   . Myocardial infarction (HCC)    mild 1/16  . Neuropathy   . Peripheral vascular disease (HCC)    right leg  . Peritoneal dialysis status (HCC)   . Renal disorder    on dialysis T, TH, Sat  . Shortness of breath dyspnea    doe  . Sleep apnea    does not have cpap yet  . Umbilical hernia     Past Surgical History:  Procedure Laterality Date  . AV FISTULA PLACEMENT Right 10/03/2015   Procedure: Right arm ARTERIOVENOUS  FISTULA CREATION;  Surgeon: Nada Libman, MD;  Location: [facility] OR;  Service: Vascular;  Laterality: Right;  . HERNIA REPAIR    . INSERTION OF DIALYSIS CATHETER     peritoneal  . INSERTION OF DIALYSIS CATHETER     right chest  . REMOVAL OF A DIALYSIS CATHETER N/A 09/12/2015   Procedure: REMOVAL OF A DIALYSIS CATHETER;  Surgeon: Renford Dills, MD;  Location: [facility] ORS;  Service: Vascular;  Laterality: N/A;    Social History   Social History  . Marital status: Married    Spouse name: N/A  . Number of children: N/A  . Years of education: N/A   Occupational History  . Not on file.   Social History Main Topics  . Smoking status: Never Smoker  . Smokeless tobacco: Never Used  . Alcohol use No  . Drug use: No  . Sexual activity: Not on file   Other Topics Concern  . Not on file    Social History Narrative  . No narrative on file    Family History  Problem Relation Age of Onset  . Hypertension Mother   . Kidney disease Mother   . Diabetes Father     Current Outpatient Prescriptions  Medication Sig Dispense Refill  . amLODipine (NORVASC) 5 MG tablet Take by mouth daily. am    . atorvastatin (LIPITOR) 40 MG tablet Take 40 mg by mouth every other day.    . Cholecalciferol (VITAMIN D-1000 MAX ST) 1000 units tablet Take 2,000 tablets by mouth daily.    . cinacalcet (SENSIPAR) 30 MG tablet Take 30 mg by mouth daily.    . DULoxetine (CYMBALTA) 60 MG capsule Take 60 mg by mouth daily.    . folic acid (FOLVITE) 1 MG tablet Take 1 mg by mouth daily.    Marland Kitchen FOSRENOL 1000 MG chewable tablet Chew 1,000-4,000 mg by mouth 4 (four) times daily. 4000 with meals and  with snacks.    . gabapentin (NEURONTIN) 300 MG capsule Take 300 mg by mouth 3 (three) times daily.     Marland Kitchen levothyroxine (SYNTHROID, LEVOTHROID) 25 MCG tablet Take 25 mcg by mouth daily before breakfast.    . loratadine (  CLARITIN) 10 MG tablet Take 1 tablet (10 mg total) by mouth daily. 30 tablet 0  . RENVELA 2.4 g PACK Take 1 Package by mouth 4 (four) times daily.    . TOPROL XL 25 MG 24 hr tablet Take 25 mg by mouth daily.    Marland Kitchen HYDROcodone-acetaminophen (NORCO/VICODIN) 5-325 MG tablet Take 1-2 tablets by mouth every 4 (four) hours as needed for severe pain. (Patient not taking: Reported on 09/01/2016) 10 tablet 0   No current facility-administered medications for this visit.      Allergies  Allergen Reactions  . Allegra [Fexofenadine] Other (See Comments)    hallucinations  . Diphenhydramine Other (See Comments)    mental status change  . Eggs Or Egg-Derived Products Nausea And Vomiting     REVIEW OF SYSTEMS:  (Positives checked otherwise negative)  CARDIOVASCULAR:    chest pain,   chest pressure,   palpitations,   shortness of breath when laying flat,   shortness of breath with  exertion,    pain in feet when walking,   pain in feet when laying flat,  history of blood clot in veins (DVT),   history of phlebitis,   swelling in legs,   varicose veins  PULMONARY:    productive cough,   asthma,   wheezing  NEUROLOGIC:    weakness in arms or legs,   numbness in arms or legs,   difficulty speaking or slurred speech,   temporary loss of vision in one eye,   dizziness  HEMATOLOGIC:    bleeding problems,   problems with blood clotting too easily  MUSCULOSKEL:    joint pain,  joint swelling  GASTROINTEST:    vomiting blood,   blood in stool     GENITOURINARY:    burning with urination,   blood in urine  end stage renal disease-HD: M/W/F  PSYCHIATRIC:    history of major depression  INTEGUMENTARY:    rashes,   ulcers  CONSTITUTIONAL:    fever,   chills    Physical Examination  Vitals:   03/22/17 1131 03/22/17 1132  BP: 140/88 (!) 144/89  Pulse: 82   Resp: 20   Temp: 98.4 F (36.9 C)   TempSrc: Oral   SpO2: 93%   Weight: 210 lb 1.6 oz (95.3 kg)   Height:  (1.727 m)    Body mass index is 31.95 kg/m.  General: A&O x 3, WD, WN  Pulmonary: Sym exp, good air movt, CTAB, no rales, rhonchi, & wheezing  Cardiac: RRR, Nl S1, S2, no Murmurs, rubs or gallops  Vascular: Vessel Right Left  Radial Palpable Palpable  Ulnar Not Palpable Not Palpable  Brachial Palpable Palpable   Gastrointestinal: soft, NTND, no G/R, no HSM, no masses, no CVAT B  Musculoskeletal: M/S 5/5 throughout , Extremities without  ischemic changes , palpable thrill in access in R forearm, + bruit in access, mod sized PSA in R forearm  Neurologic: Pain and light touch intact in extremities , Motor exam as listed above  Non-Invasive Vascular Imaging  Right Arm Access Duplex  (Date: 03/22/2017):   Diameters:  2.0-15.2 mm   Medical Decision Making  Shafiq Larch is a  51 y.o. male who presents with ESRD chronic kidney disease stage , possible venous stenosis   I agree with Dr.  Edilia Bo that the next step is R arm fistulogram, possible intervention. I discussed with the patient the nature of angiographic procedures, especially the limited patencies of any endovascular intervention.   The patient is aware of that the risks of an angiographic procedure include but are not limited to: bleeding, infection, access site complications, renal failure, embolization, rupture of vessel, dissection, arteriovenous fistula, possible need for emergent surgical intervention, possible need for surgical procedures to treat the patient's pathology, anaphylactic reaction to contrast, and stroke and death.   The patient is aware of the risks and agrees to proceed.  He is scheduled for 10 MAY 18.   Leonides Sake, MD, FACS [facility] [place] Office: [phone] Pager: [phone]

## 2017-03-23 ENCOUNTER — Encounter (HOSPITAL_COMMUNITY): Payer: Medicare Other

## 2017-03-23 ENCOUNTER — Ambulatory Visit: Payer: Medicare Other | Admitting: Vascular Surgery

## 2017-03-23 DIAGNOSIS — Z992 Dependence on renal dialysis: Secondary | ICD-10-CM | POA: Diagnosis not present

## 2017-03-23 DIAGNOSIS — N2581 Secondary hyperparathyroidism of renal origin: Secondary | ICD-10-CM | POA: Diagnosis not present

## 2017-03-23 DIAGNOSIS — N186 End stage renal disease: Secondary | ICD-10-CM | POA: Diagnosis not present

## 2017-03-23 DIAGNOSIS — D509 Iron deficiency anemia, unspecified: Secondary | ICD-10-CM | POA: Diagnosis not present

## 2017-03-25 DIAGNOSIS — N2581 Secondary hyperparathyroidism of renal origin: Secondary | ICD-10-CM | POA: Diagnosis not present

## 2017-03-25 DIAGNOSIS — D509 Iron deficiency anemia, unspecified: Secondary | ICD-10-CM | POA: Diagnosis not present

## 2017-03-25 DIAGNOSIS — N186 End stage renal disease: Secondary | ICD-10-CM | POA: Diagnosis not present

## 2017-03-25 DIAGNOSIS — Z992 Dependence on renal dialysis: Secondary | ICD-10-CM | POA: Diagnosis not present

## 2017-03-28 DIAGNOSIS — Z992 Dependence on renal dialysis: Secondary | ICD-10-CM | POA: Diagnosis not present

## 2017-03-28 DIAGNOSIS — N2581 Secondary hyperparathyroidism of renal origin: Secondary | ICD-10-CM | POA: Diagnosis not present

## 2017-03-28 DIAGNOSIS — D509 Iron deficiency anemia, unspecified: Secondary | ICD-10-CM | POA: Diagnosis not present

## 2017-03-28 DIAGNOSIS — N186 End stage renal disease: Secondary | ICD-10-CM | POA: Diagnosis not present

## 2017-03-30 DIAGNOSIS — Z992 Dependence on renal dialysis: Secondary | ICD-10-CM | POA: Diagnosis not present

## 2017-03-30 DIAGNOSIS — N186 End stage renal disease: Secondary | ICD-10-CM | POA: Diagnosis not present

## 2017-03-30 DIAGNOSIS — D509 Iron deficiency anemia, unspecified: Secondary | ICD-10-CM | POA: Diagnosis not present

## 2017-03-30 DIAGNOSIS — N2581 Secondary hyperparathyroidism of renal origin: Secondary | ICD-10-CM | POA: Diagnosis not present

## 2017-04-01 ENCOUNTER — Other Ambulatory Visit: Payer: Self-pay | Admitting: *Deleted

## 2017-04-01 ENCOUNTER — Other Ambulatory Visit: Payer: Self-pay

## 2017-04-01 DIAGNOSIS — N186 End stage renal disease: Secondary | ICD-10-CM | POA: Diagnosis not present

## 2017-04-01 DIAGNOSIS — N2581 Secondary hyperparathyroidism of renal origin: Secondary | ICD-10-CM | POA: Diagnosis not present

## 2017-04-01 DIAGNOSIS — Z992 Dependence on renal dialysis: Secondary | ICD-10-CM | POA: Diagnosis not present

## 2017-04-01 DIAGNOSIS — D509 Iron deficiency anemia, unspecified: Secondary | ICD-10-CM | POA: Diagnosis not present

## 2017-04-04 DIAGNOSIS — N2581 Secondary hyperparathyroidism of renal origin: Secondary | ICD-10-CM | POA: Diagnosis not present

## 2017-04-04 DIAGNOSIS — Z992 Dependence on renal dialysis: Secondary | ICD-10-CM | POA: Diagnosis not present

## 2017-04-04 DIAGNOSIS — D509 Iron deficiency anemia, unspecified: Secondary | ICD-10-CM | POA: Diagnosis not present

## 2017-04-04 DIAGNOSIS — N186 End stage renal disease: Secondary | ICD-10-CM | POA: Diagnosis not present

## 2017-04-06 DIAGNOSIS — N2581 Secondary hyperparathyroidism of renal origin: Secondary | ICD-10-CM | POA: Diagnosis not present

## 2017-04-06 DIAGNOSIS — Z992 Dependence on renal dialysis: Secondary | ICD-10-CM | POA: Diagnosis not present

## 2017-04-06 DIAGNOSIS — N186 End stage renal disease: Secondary | ICD-10-CM | POA: Diagnosis not present

## 2017-04-06 DIAGNOSIS — D509 Iron deficiency anemia, unspecified: Secondary | ICD-10-CM | POA: Diagnosis not present

## 2017-04-08 DIAGNOSIS — N186 End stage renal disease: Secondary | ICD-10-CM | POA: Diagnosis not present

## 2017-04-08 DIAGNOSIS — D509 Iron deficiency anemia, unspecified: Secondary | ICD-10-CM | POA: Diagnosis not present

## 2017-04-08 DIAGNOSIS — N2581 Secondary hyperparathyroidism of renal origin: Secondary | ICD-10-CM | POA: Diagnosis not present

## 2017-04-08 DIAGNOSIS — Z992 Dependence on renal dialysis: Secondary | ICD-10-CM | POA: Diagnosis not present

## 2017-04-11 DIAGNOSIS — N186 End stage renal disease: Secondary | ICD-10-CM | POA: Diagnosis not present

## 2017-04-11 DIAGNOSIS — Z992 Dependence on renal dialysis: Secondary | ICD-10-CM | POA: Diagnosis not present

## 2017-04-11 DIAGNOSIS — D509 Iron deficiency anemia, unspecified: Secondary | ICD-10-CM | POA: Diagnosis not present

## 2017-04-11 DIAGNOSIS — N2581 Secondary hyperparathyroidism of renal origin: Secondary | ICD-10-CM | POA: Diagnosis not present

## 2017-04-13 DIAGNOSIS — Z992 Dependence on renal dialysis: Secondary | ICD-10-CM | POA: Diagnosis not present

## 2017-04-13 DIAGNOSIS — N186 End stage renal disease: Secondary | ICD-10-CM | POA: Diagnosis not present

## 2017-04-13 DIAGNOSIS — D509 Iron deficiency anemia, unspecified: Secondary | ICD-10-CM | POA: Diagnosis not present

## 2017-04-13 DIAGNOSIS — N2581 Secondary hyperparathyroidism of renal origin: Secondary | ICD-10-CM | POA: Diagnosis not present

## 2017-04-14 ENCOUNTER — Encounter (HOSPITAL_COMMUNITY)
Admission: RE | Disposition: A | Discharge status: Home / Self Care | Payer: Self-pay | Source: Ambulatory Visit | Attending: Vascular Surgery

## 2017-04-14 ENCOUNTER — Encounter (HOSPITAL_COMMUNITY): Payer: Self-pay | Admitting: Vascular Surgery

## 2017-04-14 ENCOUNTER — Ambulatory Visit (HOSPITAL_COMMUNITY)
Admission: RE | Admit: 2017-04-14 | Discharge: 2017-04-14 | Disposition: A | Discharge status: Home / Self Care | Payer: Medicare Other | Source: Ambulatory Visit | Attending: Vascular Surgery | Admitting: Vascular Surgery

## 2017-04-14 DIAGNOSIS — T82858A Stenosis of vascular prosthetic devices, implants and grafts, initial encounter: Secondary | ICD-10-CM | POA: Diagnosis not present

## 2017-04-14 DIAGNOSIS — Z992 Dependence on renal dialysis: Secondary | ICD-10-CM | POA: Insufficient documentation

## 2017-04-14 DIAGNOSIS — F329 Major depressive disorder, single episode, unspecified: Secondary | ICD-10-CM | POA: Diagnosis not present

## 2017-04-14 DIAGNOSIS — J45909 Unspecified asthma, uncomplicated: Secondary | ICD-10-CM | POA: Insufficient documentation

## 2017-04-14 DIAGNOSIS — I739 Peripheral vascular disease, unspecified: Secondary | ICD-10-CM | POA: Diagnosis not present

## 2017-04-14 DIAGNOSIS — N186 End stage renal disease: Secondary | ICD-10-CM | POA: Insufficient documentation

## 2017-04-14 DIAGNOSIS — I120 Hypertensive chronic kidney disease with stage 5 chronic kidney disease or end stage renal disease: Secondary | ICD-10-CM | POA: Insufficient documentation

## 2017-04-14 DIAGNOSIS — N2581 Secondary hyperparathyroidism of renal origin: Secondary | ICD-10-CM | POA: Diagnosis not present

## 2017-04-14 DIAGNOSIS — G4730 Sleep apnea, unspecified: Secondary | ICD-10-CM | POA: Diagnosis not present

## 2017-04-14 DIAGNOSIS — G629 Polyneuropathy, unspecified: Secondary | ICD-10-CM | POA: Insufficient documentation

## 2017-04-14 DIAGNOSIS — Y832 Surgical operation with anastomosis, bypass or graft as the cause of abnormal reaction of the patient, or of later complication, without mention of misadventure at the time of the procedure: Secondary | ICD-10-CM | POA: Diagnosis not present

## 2017-04-14 DIAGNOSIS — E039 Hypothyroidism, unspecified: Secondary | ICD-10-CM | POA: Diagnosis not present

## 2017-04-14 DIAGNOSIS — I252 Old myocardial infarction: Secondary | ICD-10-CM | POA: Insufficient documentation

## 2017-04-14 DIAGNOSIS — T82838A Hemorrhage of vascular prosthetic devices, implants and grafts, initial encounter: Secondary | ICD-10-CM | POA: Diagnosis not present

## 2017-04-14 DIAGNOSIS — D509 Iron deficiency anemia, unspecified: Secondary | ICD-10-CM | POA: Diagnosis not present

## 2017-04-14 HISTORY — PX: PERIPHERAL VASCULAR BALLOON ANGIOPLASTY: CATH118281

## 2017-04-14 HISTORY — PX: A/V FISTULAGRAM: CATH118298

## 2017-04-14 LAB — POCT I-STAT, CHEM 8
BUN: 34 mg/dL — ABNORMAL HIGH (ref 6–20)
CALCIUM ION: 1.18 mmol/L (ref 1.15–1.40)
CHLORIDE: 104 mmol/L (ref 101–111)
Creatinine, Ser: 12.6 mg/dL — ABNORMAL HIGH (ref 0.61–1.24)
Glucose, Bld: 85 mg/dL (ref 65–99)
HEMATOCRIT: 43 % (ref 39.0–52.0)
Hemoglobin: 14.6 g/dL (ref 13.0–17.0)
Potassium: 4.7 mmol/L (ref 3.5–5.1)
SODIUM: 139 mmol/L (ref 135–145)
TCO2: 28 mmol/L (ref 0–100)

## 2017-04-14 SURGERY — A/V FISTULAGRAM
Anesthesia: LOCAL | Laterality: Right

## 2017-04-14 MED ORDER — FENTANYL CITRATE (PF) 100 MCG/2ML IJ SOLN
INTRAMUSCULAR | Status: DC | PRN
  Administered 2017-04-14: 50 ug via INTRAVENOUS

## 2017-04-14 MED ORDER — NITROGLYCERIN 1 MG/10 ML FOR IR/CATH LAB
INTRA_ARTERIAL | Status: AC
  Filled 2017-04-14: qty 10

## 2017-04-14 MED ORDER — GABAPENTIN 300 MG PO CAPS: 300.0000 mg | ORAL_CAPSULE | Freq: Three times a day (TID) | ORAL | Status: DC

## 2017-04-14 MED ORDER — SODIUM CHLORIDE 0.9% FLUSH: 3.0000 mL | INTRAVENOUS | Status: DC | PRN

## 2017-04-14 MED ORDER — IODIXANOL 320 MG/ML IV SOLN
INTRAVENOUS | Status: DC | PRN
  Administered 2017-04-14: 60 mL via INTRAVENOUS

## 2017-04-14 MED ORDER — SODIUM CHLORIDE 0.9% FLUSH: 3.0000 mL | Freq: Two times a day (BID) | INTRAVENOUS | Status: DC

## 2017-04-14 MED ORDER — NITROGLYCERIN 1 MG/10 ML FOR IR/CATH LAB
INTRA_ARTERIAL | Status: DC | PRN
  Administered 2017-04-14: 200 ug

## 2017-04-14 MED ORDER — ONDANSETRON HCL 4 MG/2ML IJ SOLN: 4.0000 mg | Freq: Four times a day (QID) | INTRAMUSCULAR | Status: DC | PRN

## 2017-04-14 MED ORDER — ACETAMINOPHEN 325 MG PO TABS: 650.0000 mg | ORAL_TABLET | ORAL | Status: DC | PRN

## 2017-04-14 MED ORDER — HEPARIN (PORCINE) IN NACL 2-0.9 UNIT/ML-% IJ SOLN
INTRAMUSCULAR | Status: AC
  Filled 2017-04-14: qty 500

## 2017-04-14 MED ORDER — HEPARIN SODIUM (PORCINE) 1000 UNIT/ML IJ SOLN
INTRAMUSCULAR | Status: AC
  Filled 2017-04-14: qty 1

## 2017-04-14 MED ORDER — LIDOCAINE HCL 1 % IJ SOLN
INTRAMUSCULAR | Status: AC
  Filled 2017-04-14: qty 20

## 2017-04-14 MED ORDER — FENTANYL CITRATE (PF) 100 MCG/2ML IJ SOLN
INTRAMUSCULAR | Status: AC
  Filled 2017-04-14: qty 2

## 2017-04-14 MED ORDER — SODIUM CHLORIDE 0.9 % IV SOLN: 250.0000 mL | INTRAVENOUS | Status: DC | PRN

## 2017-04-14 MED ORDER — OXYCODONE-ACETAMINOPHEN 5-325 MG PO TABS: 1.0000 | ORAL_TABLET | Freq: Four times a day (QID) | ORAL | Status: DC | PRN

## 2017-04-14 MED ORDER — HEPARIN (PORCINE) IN NACL 2-0.9 UNIT/ML-% IJ SOLN
INTRAMUSCULAR | Status: AC | PRN
  Administered 2017-04-14: 500 mL

## 2017-04-14 MED ORDER — LIDOCAINE HCL (PF) 1 % IJ SOLN
INTRAMUSCULAR | Status: DC | PRN
Start: 2017-04-14 — End: 2017-04-14
  Administered 2017-04-14: 2 mL

## 2017-04-14 MED ORDER — HEPARIN SODIUM (PORCINE) 1000 UNIT/ML IJ SOLN
INTRAMUSCULAR | Status: DC | PRN
  Administered 2017-04-14: 3000 [IU] via INTRAVENOUS

## 2017-04-14 SURGICAL SUPPLY — 19 items
BALLN ARMADA 4X120X135 (BALLOONS) ×3
BALLN ARMADA 6X120X80 (BALLOONS) ×3
BALLOON ARMADA 4X120X135 (BALLOONS) ×2 IMPLANT
BALLOON ARMADA 6X120X80 (BALLOONS) ×2 IMPLANT
CATH ANGIO 5F BER2 65CM (CATHETERS) ×3 IMPLANT
COVER DOME SNAP 22 D (MISCELLANEOUS) ×3 IMPLANT
COVER PRB 48X5XTLSCP FOLD TPE (BAG) ×2 IMPLANT
COVER PROBE 5X48 (BAG) ×1
GUIDEWIRE ANGLED .035X150CM (WIRE) ×3 IMPLANT
KIT ENCORE 26 ADVANTAGE (KITS) ×3 IMPLANT
KIT MICROINTRODUCER STIFF 5F (SHEATH) ×3 IMPLANT
PROTECTION STATION PRESSURIZED (MISCELLANEOUS) ×3
SHEATH PINNACLE R/O II 6F 4CM (SHEATH) ×3 IMPLANT
STATION PROTECTION PRESSURIZED (MISCELLANEOUS) ×2 IMPLANT
STOPCOCK MORSE 400PSI 3WAY (MISCELLANEOUS) ×3 IMPLANT
TRAY PV CATH (CUSTOM PROCEDURE TRAY) ×3 IMPLANT
TUBING CIL FLEX 10 FLL-RA (TUBING) ×3 IMPLANT
WIRE BENTSON .035X145CM (WIRE) ×3 IMPLANT
WIRE HI TORQ VERSACORE J 260CM (WIRE) ×3 IMPLANT

## 2017-04-14 NOTE — Interval H&P Note (Signed)
   History and Physical Update  The patient was interviewed and re-examined.  The patient's previous History and Physical has been reviewed and is unchanged my consult.  There is no change in the plan of care: R arm fistulogram, possible intervention.  Leonides SakeBrian Chen, MD, FACS [facility] [place] Office: [phone] Pager: [phone]  04/14/2017, 7:02 AM

## 2017-04-14 NOTE — Op Note (Signed)
OPERATIVE NOTE   PROCEDURE: 1.  right radiocephalic arteriovenous fistula cannulation under ultrasound guidance 2.  right arm shuntogram 3.  Venoplasty of brachial vein x 2 (4 mm x 120 mm, 6 mm x 120 mm) 4.  Intravenous administration of nitroglycerin   PRE-OPERATIVE DIAGNOSIS: right radiocephalic arteriovenous fistula with prolonged bleeding   POST-OPERATIVE DIAGNOSIS: same as above   SURGEON: Adele Barthel, MD  ANESTHESIA: local  ESTIMATED BLOOD LOSS: 50 cc  FINDING(S): 1. Predominantly brachial vein drainage from right radiocephalic arteriovenous fistula  2. Sub-total occlusion of both brachial vein: recannulation of 1/2 brachial vein, other vein could not be selected, so might be basilic vein 3. Extensive hypertrophy of venous network distal to the sub-total occlusion 4. Patent fistula throughout including central vein 5. Hypertrophied upper arm cephalic not in direct continuation with fistula: probably target for a transposition of fistula to upper arm  SPECIMEN(S):  None  CONTRAST: 60 cc   INDICATIONS: Robert Hays is a 51 y.o. male who presents with right radiocephalic arteriovenous fistula with extended bleeding.  The patient is scheduled for right arm shuntogram, with possible intervention.  The patient is aware of that the risks of an angiographic procedure include but are not limited to: bleeding, infection, access site complications, thrombosis of access, renal failure, embolization, rupture of vessel, dissection, arteriovenous fistula, possible need for emergent surgical intervention, possible need for surgical procedures to treat the patient's pathology, anaphylactic reaction to contrast, and stroke and death.  The patient is aware of the risks of the procedure and elects to proceed forward.   DESCRIPTION: After full informed written consent was obtained, the patient was brought back to the angiography suite and placed supine upon the angiography table.   The patient was connected to monitoring equipment.  The right forearm was prepped and draped in the standard fashion for a percutaneous access intervention.  Under ultrasound guidance, the right radiocephalic arteriovenous fistula was cannulated with a micropuncture needle.  The microwire was advanced into the fistula and the needle was exchanged for the a microsheath, which was lodged 2 cm into the access.  The wire was removed and the sheath was connected to the IV extension tubing.  Hand injections were completed to image the access from the cannulation site up the right atrium.  The findings are listed above.  Based on the images, this patient will need: attempt at recannulation of brachial veins.  The patient was given 3000 units of Heparin intravenously to obtain some anticoagulation.  A Bentson wire was advanced into the brachial vein and the sheath was exchanged for a short 6-Fr sheath.  I then placed a BER-2 catheter and glidewire to get into the axillary vein.  The wire was exchanged for a long Versacore.  Based on the imaging, a 4 mm x 120 mm angioplasty balloon was selected.  The balloon was centered around the brachial vein sub-total occlusion and inflated to 12 ATM for 2 minutes.  An obvious waist was evident.  I then deflated the balloon and exchanged it for a 6 mm x 120 mm angioplasty balloon.  I inflated this to 12 ATM for 2 minutes.  The patient completed about pain with inflation.  A waist was noted.  On completion imaging, spasm and near occlusion was present.  I injected 200 mcg of nitroglycerin.  At this point, I replaced the balloon and centered it on the sub-total occlusion.  It was inflated to 12 ATM for 3 minutes.  On completion  imaging, a <30% residual stenosis was present.  At this point, I tried to select the other "brachial vein" but I could not get into the branch, suggesting this was a basilic vein.  I elected to terminate the case at this point as the thrill was greatly improved.     Based on the completion imaging, no further intervention is necessary.  The wire and balloon were removed from the sheath.  A 4-0 Monocryl purse-string suture was sewn around the sheath.  The sheath was removed while tying down the suture.  A sterile bandage was applied to the puncture site.   COMPLICATIONS: none  CONDITION: stable   Adele Barthel, MD, [facility] [place] Office: [phone] Pager: [phone]  04/14/2017 8:33 AM

## 2017-04-14 NOTE — H&P (View-Only) (Signed)
  Established Dialysis Access  History of Present Illness  Robert Hays is a 51 y.o. (11/03/1966) male with R RC AVF who presents for re-evaluation for prolonged bleeding.  This patient previous has been seen by Dr. Dickson for bleeding from R RC AVF.  He was scheduled for R arm fistulogram but the patient canceled the procedure.  The patient continues to have prolonged bleeding, requiring 45 minutes to get bleeding under control.  The patient is still able to complete HD session with adequate flow rates.  Past Medical History:  Diagnosis Date  . Anemia   . Asthma   . Depression    pt denies  . Dysrhythmia    "skipped beats"  . Hypertension   . Hypothyroidism   . Myocardial infarction (HCC)    mild 1/16  . Neuropathy   . Peripheral vascular disease (HCC)    right leg  . Peritoneal dialysis status (HCC)   . Renal disorder    on dialysis T, TH, Sat  . Shortness of breath dyspnea    doe  . Sleep apnea    does not have cpap yet  . Umbilical hernia     Past Surgical History:  Procedure Laterality Date  . AV FISTULA PLACEMENT Right 10/03/2015   Procedure: Right arm ARTERIOVENOUS  FISTULA CREATION;  Surgeon: Vance W Brabham, MD;  Location: [facility];  Service: Vascular;  Laterality: Right;  . HERNIA REPAIR    . INSERTION OF DIALYSIS CATHETER     peritoneal  . INSERTION OF DIALYSIS CATHETER     right chest  . REMOVAL OF A DIALYSIS CATHETER N/A 09/12/2015   Procedure: REMOVAL OF A DIALYSIS CATHETER;  Surgeon: Gregory G Schnier, MD;  Location: [facility] ORS;  Service: Vascular;  Laterality: N/A;    Social History   Social History  . Marital status: Married    Spouse name: N/A  . Number of children: N/A  . Years of education: N/A   Occupational History  . Not on file.   Social History Main Topics  . Smoking status: Never Smoker  . Smokeless tobacco: Never Used  . Alcohol use No  . Drug use: No  . Sexual activity: Not on file   Other Topics Concern  . Not on file    Social History Narrative  . No narrative on file    Family History  Problem Relation Age of Onset  . Hypertension Mother   . Kidney disease Mother   . Diabetes Father     Current Outpatient Prescriptions  Medication Sig Dispense Refill  . amLODipine (NORVASC) 5 MG tablet Take by mouth daily. am    . atorvastatin (LIPITOR) 40 MG tablet Take 40 mg by mouth every other day.    . Cholecalciferol (VITAMIN D-1000 MAX ST) 1000 units tablet Take 2,000 tablets by mouth daily.    . cinacalcet (SENSIPAR) 30 MG tablet Take 30 mg by mouth daily.    . DULoxetine (CYMBALTA) 60 MG capsule Take 60 mg by mouth daily.    . folic acid (FOLVITE) 1 MG tablet Take 1 mg by mouth daily.    . FOSRENOL 1000 MG chewable tablet Chew 1,000-4,000 mg by mouth 4 (four) times daily. 4000 with meals and 1000mg with snacks.    . gabapentin (NEURONTIN) 300 MG capsule Take 300 mg by mouth 3 (three) times daily.     . levothyroxine (SYNTHROID, LEVOTHROID) 25 MCG tablet Take 25 mcg by mouth daily before breakfast.    . loratadine (  CLARITIN) 10 MG tablet Take 1 tablet (10 mg total) by mouth daily. 30 tablet 0  . RENVELA 2.4 g PACK Take 1 Package by mouth 4 (four) times daily.    . TOPROL XL 25 MG 24 hr tablet Take 25 mg by mouth daily.    Marland Kitchen. HYDROcodone-acetaminophen (NORCO/VICODIN) 5-325 MG tablet Take 1-2 tablets by mouth every 4 (four) hours as needed for severe pain. (Patient not taking: Reported on 09/01/2016) 10 tablet 0   No current facility-administered medications for this visit.      Allergies  Allergen Reactions  . Allegra [Fexofenadine] Other (See Comments)    hallucinations  . Diphenhydramine Other (See Comments)    mental status change  . Eggs Or Egg-Derived Products Nausea And Vomiting     REVIEW OF SYSTEMS:  (Positives checked otherwise negative)  CARDIOVASCULAR:   [ ]  chest pain,  [ ]  chest pressure,  [ ]  palpitations,  [ ]  shortness of breath when laying flat,  [ ]  shortness of breath with  exertion,   [ ]  pain in feet when walking,  [ ]  pain in feet when laying flat, [ ]  history of blood clot in veins (DVT),  [ ]  history of phlebitis,  [ ]  swelling in legs,  [ ]  varicose veins  PULMONARY:   [ ]  productive cough,  [ ]  asthma,  [ ]  wheezing  NEUROLOGIC:   [ ]  weakness in arms or legs,  [ ]  numbness in arms or legs,  [ ]  difficulty speaking or slurred speech,  [ ]  temporary loss of vision in one eye,  [ ]  dizziness  HEMATOLOGIC:   [ ]  bleeding problems,  [ ]  problems with blood clotting too easily  MUSCULOSKEL:   [ ]  joint pain, [ ]  joint swelling  GASTROINTEST:   [ ]  vomiting blood,  [ ]  blood in stool     GENITOURINARY:   [ ]  burning with urination,  [ ]  blood in urine [x]  end stage renal disease-HD: M/W/F  PSYCHIATRIC:   [ ]  history of major depression  INTEGUMENTARY:   [ ]  rashes,  [ ]  ulcers  CONSTITUTIONAL:   [ ]  fever,  [ ]  chills    Physical Examination  Vitals:   03/22/17 1131 03/22/17 1132  BP: 140/88 (!) 144/89  Pulse: 82   Resp: 20   Temp: 98.4 F (36.9 C)   TempSrc: Oral   SpO2: 93%   Weight: 210 lb 1.6 oz (95.3 kg)   Height: 5\' 8"  (1.727 m)    Body mass index is 31.95 kg/m.  General: A&O x 3, WD, WN  Pulmonary: Sym exp, good air movt, CTAB, no rales, rhonchi, & wheezing  Cardiac: RRR, Nl S1, S2, no Murmurs, rubs or gallops  Vascular: Vessel Right Left  Radial Palpable Palpable  Ulnar Not Palpable Not Palpable  Brachial Palpable Palpable   Gastrointestinal: soft, NTND, no G/R, no HSM, no masses, no CVAT B  Musculoskeletal: M/S 5/5 throughout , Extremities without  ischemic changes , palpable thrill in access in R forearm, + bruit in access, mod sized PSA in R forearm  Neurologic: Pain and light touch intact in extremities , Motor exam as listed above  Non-Invasive Vascular Imaging  Right Arm Access Duplex  (Date: 03/22/2017):   Diameters:  2.0-15.2 mm   Medical Decision Making  [name] is a  51 y.o. male who presents with ESRD chronic kidney disease stage , possible venous stenosis   I agree with Dr.  Edilia Bo that the next step is R arm fistulogram, possible intervention. I discussed with the patient the nature of angiographic procedures, especially the limited patencies of any endovascular intervention.   The patient is aware of that the risks of an angiographic procedure include but are not limited to: bleeding, infection, access site complications, renal failure, embolization, rupture of vessel, dissection, arteriovenous fistula, possible need for emergent surgical intervention, possible need for surgical procedures to treat the patient's pathology, anaphylactic reaction to contrast, and stroke and death.   The patient is aware of the risks and agrees to proceed.  He is scheduled for 10 MAY 18.   Leonides Sake, MD, FACS [facility] Office: [phone] Pager: [phone]

## 2017-04-14 NOTE — Discharge Instructions (Signed)
Fistulogram, Care After °Refer to this sheet in the next few weeks. These instructions provide you with information on caring for yourself after your procedure. Your health care provider may also give you more specific instructions. Your treatment has been planned according to current medical practices, but problems sometimes occur. Call your health care provider if you have any problems or questions after your procedure. °What can I expect after the procedure? °After your procedure, it is typical to have the following: °· A small amount of discomfort in the area where the catheters were placed. °· A small amount of bruising around the fistula. °· Sleepiness and fatigue. °Follow these instructions at home: °· Rest at home for the day following your procedure. °· Do not drive or operate heavy machinery while taking pain medicine. °· Take medicines only as directed by your health care provider. °· Do not take baths, swim, or use a hot tub until your health care provider approves. You may shower 24 hours after the procedure or as directed by your health care provider. °· There are many different ways to close and cover an incision, including stitches, skin glue, and adhesive strips. Follow your health care provider's instructions on: °¨ Incision care. °¨ Bandage (dressing) changes and removal. °¨ Incision closure removal. °· Monitor your dialysis fistula carefully. °Contact a health care provider if: °· You have drainage, redness, swelling, or pain at your catheter site. °· You have a fever. °· You have chills. °Get help right away if: °· You feel weak. °· You have trouble balancing. °· You have trouble moving your arms or legs. °· You have problems with your speech or vision. °· You can no longer feel a vibration or buzz when you put your fingers over your dialysis fistula. °· The limb that was used for the procedure: °¨ Swells. °¨ Is painful. °¨ Is cold. °¨ Is discolored, such as blue or pale white. °This information  is not intended to replace advice given to you by your health care provider. Make sure you discuss any questions you have with your health care provider. °Document Released: 04/08/2014 Document Revised: 04/29/2016 Document Reviewed: 01/11/2014 °Elsevier Interactive Patient Education © 2017 [employer] ° °

## 2017-04-15 DIAGNOSIS — N186 End stage renal disease: Secondary | ICD-10-CM | POA: Diagnosis not present

## 2017-04-15 DIAGNOSIS — N2581 Secondary hyperparathyroidism of renal origin: Secondary | ICD-10-CM | POA: Diagnosis not present

## 2017-04-15 DIAGNOSIS — Z992 Dependence on renal dialysis: Secondary | ICD-10-CM | POA: Diagnosis not present

## 2017-04-15 DIAGNOSIS — D509 Iron deficiency anemia, unspecified: Secondary | ICD-10-CM | POA: Diagnosis not present

## 2017-04-18 DIAGNOSIS — Z992 Dependence on renal dialysis: Secondary | ICD-10-CM | POA: Diagnosis not present

## 2017-04-18 DIAGNOSIS — D509 Iron deficiency anemia, unspecified: Secondary | ICD-10-CM | POA: Diagnosis not present

## 2017-04-18 DIAGNOSIS — N186 End stage renal disease: Secondary | ICD-10-CM | POA: Diagnosis not present

## 2017-04-18 DIAGNOSIS — N2581 Secondary hyperparathyroidism of renal origin: Secondary | ICD-10-CM | POA: Diagnosis not present

## 2017-04-20 DIAGNOSIS — Z992 Dependence on renal dialysis: Secondary | ICD-10-CM | POA: Diagnosis not present

## 2017-04-20 DIAGNOSIS — D509 Iron deficiency anemia, unspecified: Secondary | ICD-10-CM | POA: Diagnosis not present

## 2017-04-20 DIAGNOSIS — N2581 Secondary hyperparathyroidism of renal origin: Secondary | ICD-10-CM | POA: Diagnosis not present

## 2017-04-20 DIAGNOSIS — N186 End stage renal disease: Secondary | ICD-10-CM | POA: Diagnosis not present

## 2017-04-22 DIAGNOSIS — Z992 Dependence on renal dialysis: Secondary | ICD-10-CM | POA: Diagnosis not present

## 2017-04-22 DIAGNOSIS — D509 Iron deficiency anemia, unspecified: Secondary | ICD-10-CM | POA: Diagnosis not present

## 2017-04-22 DIAGNOSIS — N186 End stage renal disease: Secondary | ICD-10-CM | POA: Diagnosis not present

## 2017-04-22 DIAGNOSIS — N2581 Secondary hyperparathyroidism of renal origin: Secondary | ICD-10-CM | POA: Diagnosis not present

## 2017-04-25 DIAGNOSIS — N186 End stage renal disease: Secondary | ICD-10-CM | POA: Diagnosis not present

## 2017-04-25 DIAGNOSIS — N2581 Secondary hyperparathyroidism of renal origin: Secondary | ICD-10-CM | POA: Diagnosis not present

## 2017-04-25 DIAGNOSIS — Z992 Dependence on renal dialysis: Secondary | ICD-10-CM | POA: Diagnosis not present

## 2017-04-25 DIAGNOSIS — D509 Iron deficiency anemia, unspecified: Secondary | ICD-10-CM | POA: Diagnosis not present

## 2017-04-27 DIAGNOSIS — Z992 Dependence on renal dialysis: Secondary | ICD-10-CM | POA: Diagnosis not present

## 2017-04-27 DIAGNOSIS — D509 Iron deficiency anemia, unspecified: Secondary | ICD-10-CM | POA: Diagnosis not present

## 2017-04-27 DIAGNOSIS — N2581 Secondary hyperparathyroidism of renal origin: Secondary | ICD-10-CM | POA: Diagnosis not present

## 2017-04-27 DIAGNOSIS — N186 End stage renal disease: Secondary | ICD-10-CM | POA: Diagnosis not present

## 2017-04-29 DIAGNOSIS — N186 End stage renal disease: Secondary | ICD-10-CM | POA: Diagnosis not present

## 2017-04-29 DIAGNOSIS — Z992 Dependence on renal dialysis: Secondary | ICD-10-CM | POA: Diagnosis not present

## 2017-04-29 DIAGNOSIS — D509 Iron deficiency anemia, unspecified: Secondary | ICD-10-CM | POA: Diagnosis not present

## 2017-04-29 DIAGNOSIS — N2581 Secondary hyperparathyroidism of renal origin: Secondary | ICD-10-CM | POA: Diagnosis not present

## 2017-05-02 DIAGNOSIS — D509 Iron deficiency anemia, unspecified: Secondary | ICD-10-CM | POA: Diagnosis not present

## 2017-05-02 DIAGNOSIS — N2581 Secondary hyperparathyroidism of renal origin: Secondary | ICD-10-CM | POA: Diagnosis not present

## 2017-05-02 DIAGNOSIS — N186 End stage renal disease: Secondary | ICD-10-CM | POA: Diagnosis not present

## 2017-05-02 DIAGNOSIS — Z992 Dependence on renal dialysis: Secondary | ICD-10-CM | POA: Diagnosis not present

## 2017-05-04 DIAGNOSIS — N186 End stage renal disease: Secondary | ICD-10-CM | POA: Diagnosis not present

## 2017-05-04 DIAGNOSIS — Z992 Dependence on renal dialysis: Secondary | ICD-10-CM | POA: Diagnosis not present

## 2017-05-04 DIAGNOSIS — N2581 Secondary hyperparathyroidism of renal origin: Secondary | ICD-10-CM | POA: Diagnosis not present

## 2017-05-04 DIAGNOSIS — D509 Iron deficiency anemia, unspecified: Secondary | ICD-10-CM | POA: Diagnosis not present

## 2017-05-05 DIAGNOSIS — N186 End stage renal disease: Secondary | ICD-10-CM | POA: Diagnosis not present

## 2017-05-05 DIAGNOSIS — Z992 Dependence on renal dialysis: Secondary | ICD-10-CM | POA: Diagnosis not present

## 2017-05-06 DIAGNOSIS — Z992 Dependence on renal dialysis: Secondary | ICD-10-CM | POA: Diagnosis not present

## 2017-05-06 DIAGNOSIS — N2581 Secondary hyperparathyroidism of renal origin: Secondary | ICD-10-CM | POA: Diagnosis not present

## 2017-05-06 DIAGNOSIS — D509 Iron deficiency anemia, unspecified: Secondary | ICD-10-CM | POA: Diagnosis not present

## 2017-05-06 DIAGNOSIS — N186 End stage renal disease: Secondary | ICD-10-CM | POA: Diagnosis not present

## 2017-05-09 DIAGNOSIS — N186 End stage renal disease: Secondary | ICD-10-CM | POA: Diagnosis not present

## 2017-05-09 DIAGNOSIS — N2581 Secondary hyperparathyroidism of renal origin: Secondary | ICD-10-CM | POA: Diagnosis not present

## 2017-05-09 DIAGNOSIS — Z992 Dependence on renal dialysis: Secondary | ICD-10-CM | POA: Diagnosis not present

## 2017-05-09 DIAGNOSIS — D509 Iron deficiency anemia, unspecified: Secondary | ICD-10-CM | POA: Diagnosis not present

## 2017-05-11 DIAGNOSIS — Z992 Dependence on renal dialysis: Secondary | ICD-10-CM | POA: Diagnosis not present

## 2017-05-11 DIAGNOSIS — N2581 Secondary hyperparathyroidism of renal origin: Secondary | ICD-10-CM | POA: Diagnosis not present

## 2017-05-11 DIAGNOSIS — N186 End stage renal disease: Secondary | ICD-10-CM | POA: Diagnosis not present

## 2017-05-11 DIAGNOSIS — D509 Iron deficiency anemia, unspecified: Secondary | ICD-10-CM | POA: Diagnosis not present

## 2017-05-13 DIAGNOSIS — D509 Iron deficiency anemia, unspecified: Secondary | ICD-10-CM | POA: Diagnosis not present

## 2017-05-13 DIAGNOSIS — Z992 Dependence on renal dialysis: Secondary | ICD-10-CM | POA: Diagnosis not present

## 2017-05-13 DIAGNOSIS — N2581 Secondary hyperparathyroidism of renal origin: Secondary | ICD-10-CM | POA: Diagnosis not present

## 2017-05-13 DIAGNOSIS — N186 End stage renal disease: Secondary | ICD-10-CM | POA: Diagnosis not present

## 2017-05-16 DIAGNOSIS — D509 Iron deficiency anemia, unspecified: Secondary | ICD-10-CM | POA: Diagnosis not present

## 2017-05-16 DIAGNOSIS — N2581 Secondary hyperparathyroidism of renal origin: Secondary | ICD-10-CM | POA: Diagnosis not present

## 2017-05-16 DIAGNOSIS — N186 End stage renal disease: Secondary | ICD-10-CM | POA: Diagnosis not present

## 2017-05-16 DIAGNOSIS — Z992 Dependence on renal dialysis: Secondary | ICD-10-CM | POA: Diagnosis not present

## 2017-05-18 DIAGNOSIS — N2581 Secondary hyperparathyroidism of renal origin: Secondary | ICD-10-CM | POA: Diagnosis not present

## 2017-05-18 DIAGNOSIS — N186 End stage renal disease: Secondary | ICD-10-CM | POA: Diagnosis not present

## 2017-05-18 DIAGNOSIS — Z992 Dependence on renal dialysis: Secondary | ICD-10-CM | POA: Diagnosis not present

## 2017-05-18 DIAGNOSIS — D509 Iron deficiency anemia, unspecified: Secondary | ICD-10-CM | POA: Diagnosis not present

## 2017-05-20 DIAGNOSIS — N2581 Secondary hyperparathyroidism of renal origin: Secondary | ICD-10-CM | POA: Diagnosis not present

## 2017-05-20 DIAGNOSIS — D509 Iron deficiency anemia, unspecified: Secondary | ICD-10-CM | POA: Diagnosis not present

## 2017-05-20 DIAGNOSIS — Z992 Dependence on renal dialysis: Secondary | ICD-10-CM | POA: Diagnosis not present

## 2017-05-20 DIAGNOSIS — N186 End stage renal disease: Secondary | ICD-10-CM | POA: Diagnosis not present

## 2017-05-23 DIAGNOSIS — N186 End stage renal disease: Secondary | ICD-10-CM | POA: Diagnosis not present

## 2017-05-23 DIAGNOSIS — D509 Iron deficiency anemia, unspecified: Secondary | ICD-10-CM | POA: Diagnosis not present

## 2017-05-23 DIAGNOSIS — N2581 Secondary hyperparathyroidism of renal origin: Secondary | ICD-10-CM | POA: Diagnosis not present

## 2017-05-23 DIAGNOSIS — Z992 Dependence on renal dialysis: Secondary | ICD-10-CM | POA: Diagnosis not present

## 2017-05-25 DIAGNOSIS — N2581 Secondary hyperparathyroidism of renal origin: Secondary | ICD-10-CM | POA: Diagnosis not present

## 2017-05-25 DIAGNOSIS — Z992 Dependence on renal dialysis: Secondary | ICD-10-CM | POA: Diagnosis not present

## 2017-05-25 DIAGNOSIS — D509 Iron deficiency anemia, unspecified: Secondary | ICD-10-CM | POA: Diagnosis not present

## 2017-05-25 DIAGNOSIS — N186 End stage renal disease: Secondary | ICD-10-CM | POA: Diagnosis not present

## 2017-05-27 DIAGNOSIS — N2581 Secondary hyperparathyroidism of renal origin: Secondary | ICD-10-CM | POA: Diagnosis not present

## 2017-05-27 DIAGNOSIS — D509 Iron deficiency anemia, unspecified: Secondary | ICD-10-CM | POA: Diagnosis not present

## 2017-05-27 DIAGNOSIS — N186 End stage renal disease: Secondary | ICD-10-CM | POA: Diagnosis not present

## 2017-05-27 DIAGNOSIS — Z992 Dependence on renal dialysis: Secondary | ICD-10-CM | POA: Diagnosis not present

## 2017-05-30 DIAGNOSIS — Z992 Dependence on renal dialysis: Secondary | ICD-10-CM | POA: Diagnosis not present

## 2017-05-30 DIAGNOSIS — D509 Iron deficiency anemia, unspecified: Secondary | ICD-10-CM | POA: Diagnosis not present

## 2017-05-30 DIAGNOSIS — N186 End stage renal disease: Secondary | ICD-10-CM | POA: Diagnosis not present

## 2017-05-30 DIAGNOSIS — N2581 Secondary hyperparathyroidism of renal origin: Secondary | ICD-10-CM | POA: Diagnosis not present

## 2017-06-01 DIAGNOSIS — D509 Iron deficiency anemia, unspecified: Secondary | ICD-10-CM | POA: Diagnosis not present

## 2017-06-01 DIAGNOSIS — M79603 Pain in arm, unspecified: Secondary | ICD-10-CM | POA: Diagnosis not present

## 2017-06-01 DIAGNOSIS — G4733 Obstructive sleep apnea (adult) (pediatric): Secondary | ICD-10-CM | POA: Diagnosis not present

## 2017-06-01 DIAGNOSIS — N2581 Secondary hyperparathyroidism of renal origin: Secondary | ICD-10-CM | POA: Diagnosis not present

## 2017-06-01 DIAGNOSIS — Z992 Dependence on renal dialysis: Secondary | ICD-10-CM | POA: Diagnosis not present

## 2017-06-01 DIAGNOSIS — G603 Idiopathic progressive neuropathy: Secondary | ICD-10-CM | POA: Diagnosis not present

## 2017-06-01 DIAGNOSIS — M1009 Idiopathic gout, multiple sites: Secondary | ICD-10-CM | POA: Diagnosis not present

## 2017-06-01 DIAGNOSIS — N186 End stage renal disease: Secondary | ICD-10-CM | POA: Diagnosis not present

## 2017-06-01 DIAGNOSIS — Z79899 Other long term (current) drug therapy: Secondary | ICD-10-CM | POA: Diagnosis not present

## 2017-06-01 DIAGNOSIS — I10 Essential (primary) hypertension: Secondary | ICD-10-CM | POA: Diagnosis not present

## 2017-06-01 DIAGNOSIS — G5610 Other lesions of median nerve, unspecified upper limb: Secondary | ICD-10-CM | POA: Diagnosis not present

## 2017-06-01 DIAGNOSIS — M792 Neuralgia and neuritis, unspecified: Secondary | ICD-10-CM | POA: Diagnosis not present

## 2017-06-01 DIAGNOSIS — N289 Disorder of kidney and ureter, unspecified: Secondary | ICD-10-CM | POA: Diagnosis not present

## 2017-06-03 DIAGNOSIS — Z992 Dependence on renal dialysis: Secondary | ICD-10-CM | POA: Diagnosis not present

## 2017-06-03 DIAGNOSIS — N2581 Secondary hyperparathyroidism of renal origin: Secondary | ICD-10-CM | POA: Diagnosis not present

## 2017-06-03 DIAGNOSIS — D509 Iron deficiency anemia, unspecified: Secondary | ICD-10-CM | POA: Diagnosis not present

## 2017-06-03 DIAGNOSIS — N186 End stage renal disease: Secondary | ICD-10-CM | POA: Diagnosis not present

## 2017-06-04 DIAGNOSIS — Z992 Dependence on renal dialysis: Secondary | ICD-10-CM | POA: Diagnosis not present

## 2017-06-04 DIAGNOSIS — N186 End stage renal disease: Secondary | ICD-10-CM | POA: Diagnosis not present

## 2017-06-06 DIAGNOSIS — N2581 Secondary hyperparathyroidism of renal origin: Secondary | ICD-10-CM | POA: Diagnosis not present

## 2017-06-06 DIAGNOSIS — D509 Iron deficiency anemia, unspecified: Secondary | ICD-10-CM | POA: Diagnosis not present

## 2017-06-06 DIAGNOSIS — N186 End stage renal disease: Secondary | ICD-10-CM | POA: Diagnosis not present

## 2017-06-06 DIAGNOSIS — Z992 Dependence on renal dialysis: Secondary | ICD-10-CM | POA: Diagnosis not present

## 2017-06-08 DIAGNOSIS — D509 Iron deficiency anemia, unspecified: Secondary | ICD-10-CM | POA: Diagnosis not present

## 2017-06-08 DIAGNOSIS — N186 End stage renal disease: Secondary | ICD-10-CM | POA: Diagnosis not present

## 2017-06-08 DIAGNOSIS — Z992 Dependence on renal dialysis: Secondary | ICD-10-CM | POA: Diagnosis not present

## 2017-06-08 DIAGNOSIS — N2581 Secondary hyperparathyroidism of renal origin: Secondary | ICD-10-CM | POA: Diagnosis not present

## 2017-06-09 ENCOUNTER — Telehealth: Payer: Self-pay | Admitting: Vascular Surgery

## 2017-06-09 NOTE — Telephone Encounter (Signed)
-----   Message from Sharee PimpleMarilyn K McChesney, RN sent at 06/07/2017  3:00 PM EDT ----- Regarding: Don't see this postop appt I didn't see this 3 month appt with duplex, can you check on this? Thanks ----- Message ----- From: Fransisco Hertzhen, Brian L, MD Sent: 04/14/2017   8:43 AM To: [address]Vvs Charge Pool  [place] ModeClifford W Scaife [id number] 1966-02-16   PROCEDURE: 1.  right radiocephalic arteriovenous fistula cannulation under ultrasound guidance 2.  right arm shuntogram 3.  Venoplasty of brachial vein x 2 (4 mm x 120 mm, 6 mm x 120 mm) 4.  Intravenous administration of nitroglycerin  Follow-up: 3 months  Orders(s) for follow-up: R arm access duplex

## 2017-06-09 NOTE — Telephone Encounter (Signed)
Sched appt 09/09/17; lab at 2:00 and MD at 2:45. Mailed appt letter.

## 2017-06-10 DIAGNOSIS — Z992 Dependence on renal dialysis: Secondary | ICD-10-CM | POA: Diagnosis not present

## 2017-06-10 DIAGNOSIS — N186 End stage renal disease: Secondary | ICD-10-CM | POA: Diagnosis not present

## 2017-06-10 DIAGNOSIS — N2581 Secondary hyperparathyroidism of renal origin: Secondary | ICD-10-CM | POA: Diagnosis not present

## 2017-06-10 DIAGNOSIS — D509 Iron deficiency anemia, unspecified: Secondary | ICD-10-CM | POA: Diagnosis not present

## 2017-06-13 DIAGNOSIS — D509 Iron deficiency anemia, unspecified: Secondary | ICD-10-CM | POA: Diagnosis not present

## 2017-06-13 DIAGNOSIS — Z992 Dependence on renal dialysis: Secondary | ICD-10-CM | POA: Diagnosis not present

## 2017-06-13 DIAGNOSIS — N186 End stage renal disease: Secondary | ICD-10-CM | POA: Diagnosis not present

## 2017-06-13 DIAGNOSIS — N2581 Secondary hyperparathyroidism of renal origin: Secondary | ICD-10-CM | POA: Diagnosis not present

## 2017-06-17 DIAGNOSIS — D509 Iron deficiency anemia, unspecified: Secondary | ICD-10-CM | POA: Diagnosis not present

## 2017-06-17 DIAGNOSIS — N2581 Secondary hyperparathyroidism of renal origin: Secondary | ICD-10-CM | POA: Diagnosis not present

## 2017-06-17 DIAGNOSIS — N186 End stage renal disease: Secondary | ICD-10-CM | POA: Diagnosis not present

## 2017-06-17 DIAGNOSIS — Z992 Dependence on renal dialysis: Secondary | ICD-10-CM | POA: Diagnosis not present

## 2017-06-20 DIAGNOSIS — N2581 Secondary hyperparathyroidism of renal origin: Secondary | ICD-10-CM | POA: Diagnosis not present

## 2017-06-20 DIAGNOSIS — Z992 Dependence on renal dialysis: Secondary | ICD-10-CM | POA: Diagnosis not present

## 2017-06-20 DIAGNOSIS — D509 Iron deficiency anemia, unspecified: Secondary | ICD-10-CM | POA: Diagnosis not present

## 2017-06-20 DIAGNOSIS — N186 End stage renal disease: Secondary | ICD-10-CM | POA: Diagnosis not present

## 2017-06-22 DIAGNOSIS — D509 Iron deficiency anemia, unspecified: Secondary | ICD-10-CM | POA: Diagnosis not present

## 2017-06-22 DIAGNOSIS — Z992 Dependence on renal dialysis: Secondary | ICD-10-CM | POA: Diagnosis not present

## 2017-06-22 DIAGNOSIS — N2581 Secondary hyperparathyroidism of renal origin: Secondary | ICD-10-CM | POA: Diagnosis not present

## 2017-06-22 DIAGNOSIS — N186 End stage renal disease: Secondary | ICD-10-CM | POA: Diagnosis not present

## 2017-06-24 ENCOUNTER — Ambulatory Visit: Payer: Medicare Other | Admitting: Family

## 2017-06-24 DIAGNOSIS — N2581 Secondary hyperparathyroidism of renal origin: Secondary | ICD-10-CM | POA: Diagnosis not present

## 2017-06-24 DIAGNOSIS — N186 End stage renal disease: Secondary | ICD-10-CM | POA: Diagnosis not present

## 2017-06-24 DIAGNOSIS — Z992 Dependence on renal dialysis: Secondary | ICD-10-CM | POA: Diagnosis not present

## 2017-06-24 DIAGNOSIS — D509 Iron deficiency anemia, unspecified: Secondary | ICD-10-CM | POA: Diagnosis not present

## 2017-06-27 ENCOUNTER — Encounter: Payer: Self-pay | Admitting: Family

## 2017-06-27 DIAGNOSIS — N186 End stage renal disease: Secondary | ICD-10-CM | POA: Diagnosis not present

## 2017-06-27 DIAGNOSIS — D509 Iron deficiency anemia, unspecified: Secondary | ICD-10-CM | POA: Diagnosis not present

## 2017-06-27 DIAGNOSIS — Z992 Dependence on renal dialysis: Secondary | ICD-10-CM | POA: Diagnosis not present

## 2017-06-27 DIAGNOSIS — N2581 Secondary hyperparathyroidism of renal origin: Secondary | ICD-10-CM | POA: Diagnosis not present

## 2017-06-28 ENCOUNTER — Encounter: Payer: Self-pay | Admitting: Family

## 2017-06-28 ENCOUNTER — Ambulatory Visit (INDEPENDENT_AMBULATORY_CARE_PROVIDER_SITE_OTHER): Payer: Self-pay | Admitting: Family

## 2017-06-28 VITALS — BP 153/91 | HR 83 | Temp 97.9°F | Resp 16 | Ht 68.0 in | Wt 222.0 lb

## 2017-06-28 DIAGNOSIS — T82898D Other specified complication of vascular prosthetic devices, implants and grafts, subsequent encounter: Secondary | ICD-10-CM

## 2017-06-28 DIAGNOSIS — Z992 Dependence on renal dialysis: Secondary | ICD-10-CM

## 2017-06-28 DIAGNOSIS — I770 Arteriovenous fistula, acquired: Secondary | ICD-10-CM

## 2017-06-28 DIAGNOSIS — N186 End stage renal disease: Secondary | ICD-10-CM

## 2017-06-28 NOTE — Progress Notes (Signed)
Postoperative Access Visit   History of Present Illness  Robert Hays is a 51 y.o. year old male who is s/p right radiocephalic arteriovenous fistula cannulation under ultrasound guidance, right arm shuntogram, Venoplasty of brachial vein x 2 (4 mm x 120 mm, 6 mm x 120 mm), and Intravenous administration of nitroglycerin on 04-14-17 by Dr. Imogene Burn for right radiocephalic arteriovenous fistula with extended bleeding.   FINDING(S): 1. Predominantly brachial vein drainage from right radiocephalic arteriovenous fistula  2. Sub-total occlusion of both brachial vein: recannulation of 1/2 brachial vein, other vein could not be selected, so might be basilic vein 3. Extensive hypertrophy of venous network distal to the sub-total occlusion 4. Patent fistula throughout including central vein 5. Hypertrophied upper arm cephalic not in direct continuation with fistula: probably target for a transposition of fistula to upper arm.   He has an appointment on 09-09-17 with Dr. Imogene Burn and dialysis duplex.  Pt returns today at the request of Gerome Apley, PA, for evaluation of prolonged bleeding post dialysis. Pt states the staff has trouble dialyzing him due to "clogging" of the AVF.  He denies steal symptoms when not on HD, has some steal symptoms during HD in right hand.  He denies fever or chills.   He dialyzes M-W-F, changed from T-TH-S, at [facility] in [place]. Pt states the procedure on 04-14-17 did not seem to help the clogging and prolonged bleeding. There are 2 apparent stick sites used at his AV fistula, no other stick used per pt.    Pt states he was performing home peritoneal dialysis prior to HD.   The patient is able to complete their activities of daily living.     For VQI Use Only  PRE-ADM LIVING: Home  AMB STATUS: Ambulatory   Past Medical History:  Diagnosis Date  . Anemia   . Asthma   . Depression    pt denies  . Dysrhythmia    "skipped beats"  . Hypertension   .  Hypothyroidism   . Myocardial infarction (HCC)    mild 1/16  . Neuropathy   . Peripheral vascular disease (HCC)    right leg  . Peritoneal dialysis status (HCC)   . Renal disorder    on dialysis T, TH, Sat  . Shortness of breath dyspnea    doe  . Sleep apnea    does not have cpap yet  . Umbilical hernia     Past Surgical History:  Procedure Laterality Date  . A/V SHUNTOGRAM N/A 04/14/2017   Procedure: A/V Fistulagram;  Surgeon: Fransisco Hertz, MD;  Location: [facility] INVASIVE CV LAB;  Service: Cardiovascular;  Laterality: N/A;  . AV FISTULA PLACEMENT Right 10/03/2015   Procedure: Right arm ARTERIOVENOUS  FISTULA CREATION;  Surgeon: Nada Libman, MD;  Location: [facility] OR;  Service: Vascular;  Laterality: Right;  . HERNIA REPAIR    . INSERTION OF DIALYSIS CATHETER     peritoneal  . INSERTION OF DIALYSIS CATHETER     right chest  . PERIPHERAL VASCULAR BALLOON ANGIOPLASTY Right 04/14/2017   Procedure: Peripheral Vascular Balloon Angioplasty;  Surgeon: Fransisco Hertz, MD;  Location: [facility] INVASIVE CV LAB;  Service: Cardiovascular;  Laterality: Right;  fistula  . REMOVAL OF A DIALYSIS CATHETER N/A 09/12/2015   Procedure: REMOVAL OF A DIALYSIS CATHETER;  Surgeon: Renford Dills, MD;  Location: [facility] ORS;  Service: Vascular;  Laterality: N/A;    Social History   Social History  . Marital status: Married  Spouse name: N/A  . Number of children: N/A  . Years of education: N/A   Occupational History  . Not on file.   Social History Main Topics  . Smoking status: Never Smoker  . Smokeless tobacco: Never Used  . Alcohol use No  . Drug use: No  . Sexual activity: Not on file   Other Topics Concern  . Not on file   Social History Narrative  . No narrative on file    Allergies  Allergen Reactions  . Allegra [Fexofenadine] Other (See Comments)    hallucinations  . Diphenhydramine Other (See Comments)    mental status change  . Eggs Or Egg-Derived Products Nausea And Vomiting     Current Outpatient Prescriptions on File Prior to Visit  Medication Sig Dispense Refill  . amLODipine (NORVASC) 10 MG tablet Take 10 mg by mouth daily.    Marland Kitchen. aspirin EC 81 MG tablet Take 81 mg by mouth daily.    . cholecalciferol (VITAMIN D) 1000 units tablet Take 2,000 Units by mouth daily.    . folic acid (FOLVITE) 1 MG tablet Take 1 mg by mouth daily.    Marland Kitchen. gabapentin (NEURONTIN) 300 MG capsule Take 300 mg by mouth 3 (three) times daily.     . metoprolol tartrate (LOPRESSOR) 25 MG tablet Take 25 mg by mouth daily.    . multivitamin (RENA-VIT) TABS tablet Take 1 tablet by mouth daily.    . sevelamer carbonate (RENVELA) 800 MG tablet Take 1,600-3,200 mg by mouth 3 (three) times daily with meals. Takes 4 tablets with meals and 2 tablets with snacks    . traMADol (ULTRAM) 50 MG tablet Take 100 mg by mouth every 6 (six) hours as needed for pain.    . isosorbide mononitrate (IMDUR) 30 MG 24 hr tablet Take 30 mg by mouth daily.     No current facility-administered medications on file prior to visit.       Physical Examination Vitals:   06/28/17 1016  BP: (!) 153/91  Pulse: 83  Resp: 16  Temp: 97.9 F (36.6 C)  TempSrc: Oral  SpO2: 92%  Weight: 222 lb (100.7 kg)  Height: 5\' 8"  (1.727 m)   Body mass index is 33.75 kg/m.  Skin feels warm and normal, hand grip is 5/5, sensation in digits is intact, palpable thrill, bruit can  be auscultated .  Medical Decision Making  Robert Hays is a 51 y.o. year old male who presents s/p Venoplasty of brachial vein x 2 (4 mm x 120 mm, 6 mm x 120 mm) on 04-14-17.  Dr. Arbie CookeyEarly spoke with and examined pt.  This AV fistula seems to be well functioning. It appears that there are only 2 access sites used; HD staff are advised to access AV fistula more proximal to the two stick sites that have been used.  Follow up as scheduled on 09-09-17 with dialysis duplex and Dr. Imogene Burnhen.    Thank you for allowing us to participate in this patient's  care.  Norwin Aleman, Carma LairSUZANNE L, RN, MSN, FNP-C [facility] Office: [phone]  06/28/2017, 10:21 AM  Clinic MD: Early

## 2017-06-29 DIAGNOSIS — N186 End stage renal disease: Secondary | ICD-10-CM | POA: Diagnosis not present

## 2017-06-29 DIAGNOSIS — N2581 Secondary hyperparathyroidism of renal origin: Secondary | ICD-10-CM | POA: Diagnosis not present

## 2017-06-29 DIAGNOSIS — Z992 Dependence on renal dialysis: Secondary | ICD-10-CM | POA: Diagnosis not present

## 2017-06-29 DIAGNOSIS — D509 Iron deficiency anemia, unspecified: Secondary | ICD-10-CM | POA: Diagnosis not present

## 2017-07-01 DIAGNOSIS — N2581 Secondary hyperparathyroidism of renal origin: Secondary | ICD-10-CM | POA: Diagnosis not present

## 2017-07-01 DIAGNOSIS — Z992 Dependence on renal dialysis: Secondary | ICD-10-CM | POA: Diagnosis not present

## 2017-07-01 DIAGNOSIS — N186 End stage renal disease: Secondary | ICD-10-CM | POA: Diagnosis not present

## 2017-07-01 DIAGNOSIS — D509 Iron deficiency anemia, unspecified: Secondary | ICD-10-CM | POA: Diagnosis not present

## 2017-07-04 DIAGNOSIS — Z992 Dependence on renal dialysis: Secondary | ICD-10-CM | POA: Diagnosis not present

## 2017-07-04 DIAGNOSIS — N2581 Secondary hyperparathyroidism of renal origin: Secondary | ICD-10-CM | POA: Diagnosis not present

## 2017-07-04 DIAGNOSIS — N186 End stage renal disease: Secondary | ICD-10-CM | POA: Diagnosis not present

## 2017-07-04 DIAGNOSIS — D509 Iron deficiency anemia, unspecified: Secondary | ICD-10-CM | POA: Diagnosis not present

## 2017-07-05 DIAGNOSIS — N186 End stage renal disease: Secondary | ICD-10-CM | POA: Diagnosis not present

## 2017-07-05 DIAGNOSIS — Z992 Dependence on renal dialysis: Secondary | ICD-10-CM | POA: Diagnosis not present

## 2017-07-06 DIAGNOSIS — D509 Iron deficiency anemia, unspecified: Secondary | ICD-10-CM | POA: Diagnosis not present

## 2017-07-06 DIAGNOSIS — N2581 Secondary hyperparathyroidism of renal origin: Secondary | ICD-10-CM | POA: Diagnosis not present

## 2017-07-06 DIAGNOSIS — N186 End stage renal disease: Secondary | ICD-10-CM | POA: Diagnosis not present

## 2017-07-06 DIAGNOSIS — Z992 Dependence on renal dialysis: Secondary | ICD-10-CM | POA: Diagnosis not present

## 2017-07-08 DIAGNOSIS — D509 Iron deficiency anemia, unspecified: Secondary | ICD-10-CM | POA: Diagnosis not present

## 2017-07-08 DIAGNOSIS — N186 End stage renal disease: Secondary | ICD-10-CM | POA: Diagnosis not present

## 2017-07-08 DIAGNOSIS — Z992 Dependence on renal dialysis: Secondary | ICD-10-CM | POA: Diagnosis not present

## 2017-07-08 DIAGNOSIS — N2581 Secondary hyperparathyroidism of renal origin: Secondary | ICD-10-CM | POA: Diagnosis not present

## 2017-07-11 DIAGNOSIS — N2581 Secondary hyperparathyroidism of renal origin: Secondary | ICD-10-CM | POA: Diagnosis not present

## 2017-07-11 DIAGNOSIS — D509 Iron deficiency anemia, unspecified: Secondary | ICD-10-CM | POA: Diagnosis not present

## 2017-07-11 DIAGNOSIS — Z992 Dependence on renal dialysis: Secondary | ICD-10-CM | POA: Diagnosis not present

## 2017-07-11 DIAGNOSIS — N186 End stage renal disease: Secondary | ICD-10-CM | POA: Diagnosis not present

## 2017-07-13 DIAGNOSIS — N186 End stage renal disease: Secondary | ICD-10-CM | POA: Diagnosis not present

## 2017-07-13 DIAGNOSIS — Z992 Dependence on renal dialysis: Secondary | ICD-10-CM | POA: Diagnosis not present

## 2017-07-13 DIAGNOSIS — N2581 Secondary hyperparathyroidism of renal origin: Secondary | ICD-10-CM | POA: Diagnosis not present

## 2017-07-13 DIAGNOSIS — D509 Iron deficiency anemia, unspecified: Secondary | ICD-10-CM | POA: Diagnosis not present

## 2017-07-15 DIAGNOSIS — D509 Iron deficiency anemia, unspecified: Secondary | ICD-10-CM | POA: Diagnosis not present

## 2017-07-15 DIAGNOSIS — Z992 Dependence on renal dialysis: Secondary | ICD-10-CM | POA: Diagnosis not present

## 2017-07-15 DIAGNOSIS — N186 End stage renal disease: Secondary | ICD-10-CM | POA: Diagnosis not present

## 2017-07-15 DIAGNOSIS — N2581 Secondary hyperparathyroidism of renal origin: Secondary | ICD-10-CM | POA: Diagnosis not present

## 2017-07-18 DIAGNOSIS — N2581 Secondary hyperparathyroidism of renal origin: Secondary | ICD-10-CM | POA: Diagnosis not present

## 2017-07-18 DIAGNOSIS — Z992 Dependence on renal dialysis: Secondary | ICD-10-CM | POA: Diagnosis not present

## 2017-07-18 DIAGNOSIS — N186 End stage renal disease: Secondary | ICD-10-CM | POA: Diagnosis not present

## 2017-07-18 DIAGNOSIS — D509 Iron deficiency anemia, unspecified: Secondary | ICD-10-CM | POA: Diagnosis not present

## 2017-07-20 DIAGNOSIS — Z992 Dependence on renal dialysis: Secondary | ICD-10-CM | POA: Diagnosis not present

## 2017-07-20 DIAGNOSIS — N186 End stage renal disease: Secondary | ICD-10-CM | POA: Diagnosis not present

## 2017-07-20 DIAGNOSIS — D509 Iron deficiency anemia, unspecified: Secondary | ICD-10-CM | POA: Diagnosis not present

## 2017-07-20 DIAGNOSIS — N2581 Secondary hyperparathyroidism of renal origin: Secondary | ICD-10-CM | POA: Diagnosis not present

## 2017-07-22 DIAGNOSIS — D509 Iron deficiency anemia, unspecified: Secondary | ICD-10-CM | POA: Diagnosis not present

## 2017-07-22 DIAGNOSIS — Z992 Dependence on renal dialysis: Secondary | ICD-10-CM | POA: Diagnosis not present

## 2017-07-22 DIAGNOSIS — N2581 Secondary hyperparathyroidism of renal origin: Secondary | ICD-10-CM | POA: Diagnosis not present

## 2017-07-22 DIAGNOSIS — N186 End stage renal disease: Secondary | ICD-10-CM | POA: Diagnosis not present

## 2017-07-25 DIAGNOSIS — D509 Iron deficiency anemia, unspecified: Secondary | ICD-10-CM | POA: Diagnosis not present

## 2017-07-25 DIAGNOSIS — N2581 Secondary hyperparathyroidism of renal origin: Secondary | ICD-10-CM | POA: Diagnosis not present

## 2017-07-25 DIAGNOSIS — Z992 Dependence on renal dialysis: Secondary | ICD-10-CM | POA: Diagnosis not present

## 2017-07-25 DIAGNOSIS — N186 End stage renal disease: Secondary | ICD-10-CM | POA: Diagnosis not present

## 2017-07-27 DIAGNOSIS — N186 End stage renal disease: Secondary | ICD-10-CM | POA: Diagnosis not present

## 2017-07-27 DIAGNOSIS — Z992 Dependence on renal dialysis: Secondary | ICD-10-CM | POA: Diagnosis not present

## 2017-07-27 DIAGNOSIS — D509 Iron deficiency anemia, unspecified: Secondary | ICD-10-CM | POA: Diagnosis not present

## 2017-07-27 DIAGNOSIS — N2581 Secondary hyperparathyroidism of renal origin: Secondary | ICD-10-CM | POA: Diagnosis not present

## 2017-07-29 DIAGNOSIS — Z992 Dependence on renal dialysis: Secondary | ICD-10-CM | POA: Diagnosis not present

## 2017-07-29 DIAGNOSIS — D509 Iron deficiency anemia, unspecified: Secondary | ICD-10-CM | POA: Diagnosis not present

## 2017-07-29 DIAGNOSIS — N2581 Secondary hyperparathyroidism of renal origin: Secondary | ICD-10-CM | POA: Diagnosis not present

## 2017-07-29 DIAGNOSIS — N186 End stage renal disease: Secondary | ICD-10-CM | POA: Diagnosis not present

## 2017-07-30 IMAGING — CT CT HEAD W/O CM
1 series · 15 of 30 positions shown, 19 images · non-contrast
Comparison: None.

CLINICAL DATA: Generalized weakness, worsening over the last 3
days. Peritoneal dialysis yesterday. Confusion. Headache.

EXAM:
CT HEAD WITHOUT CONTRAST
TECHNIQUE: Contiguous axial images were obtained from the base of the skull
through the vertex without intravenous contrast.

[Series 2: headtrauma 4.8 h37s · axial · 0.45mm/px · z∈[+1053,+1210]mm · 15 of 36 slices shown, 19 images]
[im 2/36  brain]
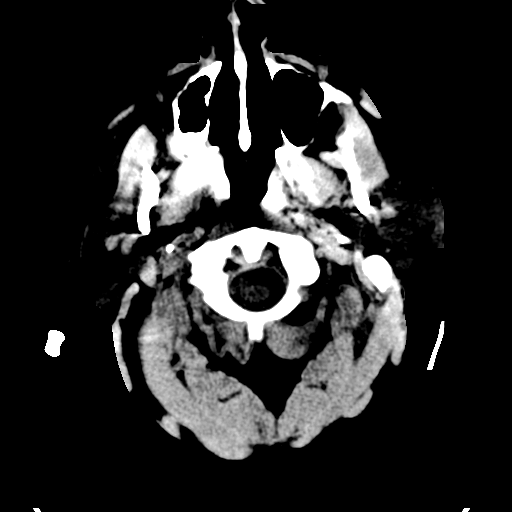
[im 2/36  bone]
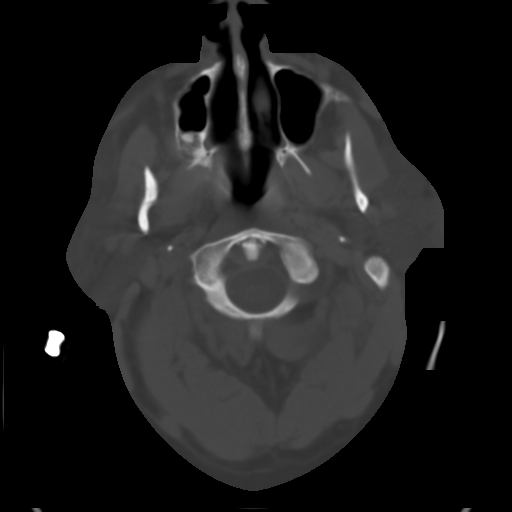
[im 4/36  brain]
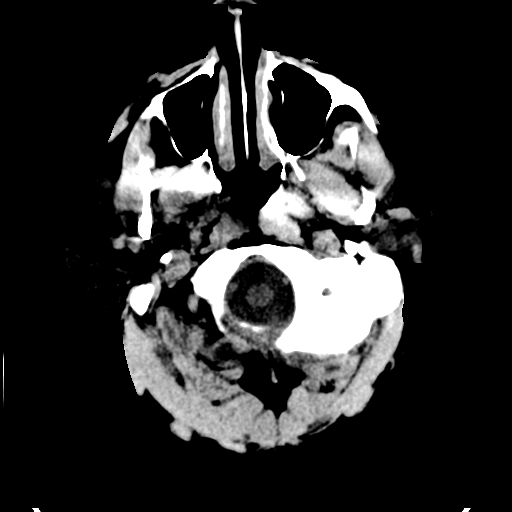
[im 7/36  brain]
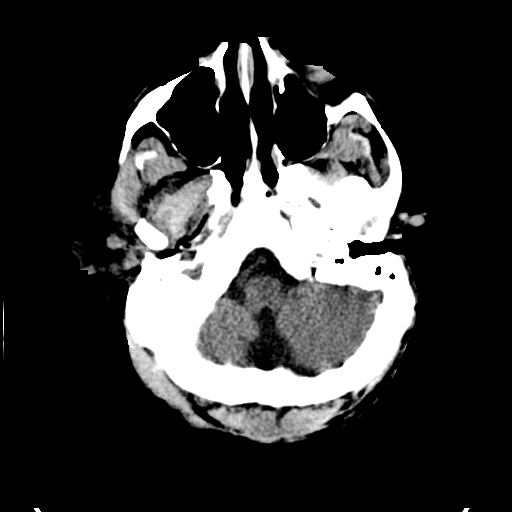
[im 9/36  brain]
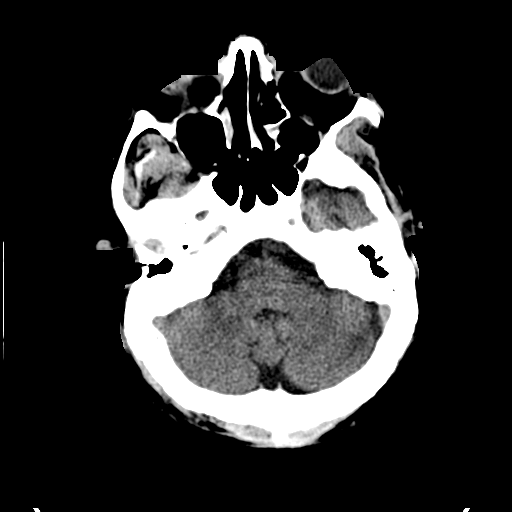
[im 11/36  brain]
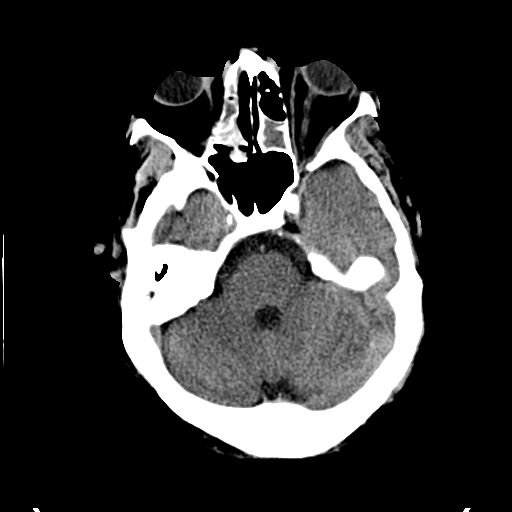
[im 11/36  bone]
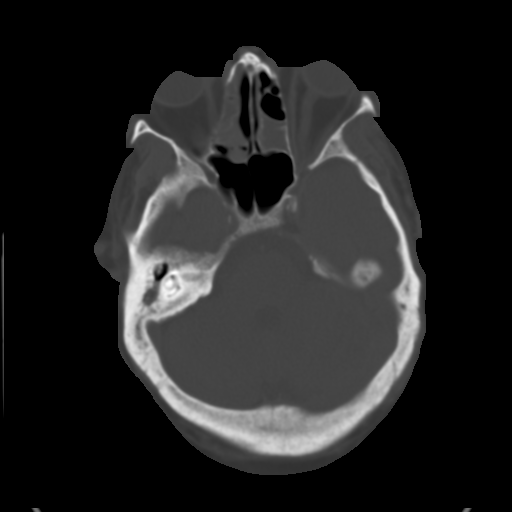
[im 14/36  brain]
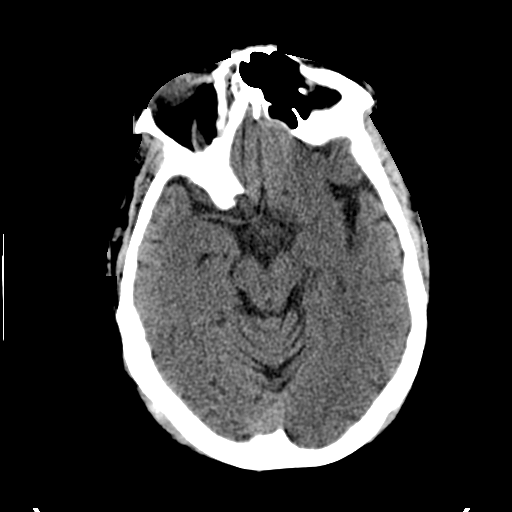
[im 16/36  brain]
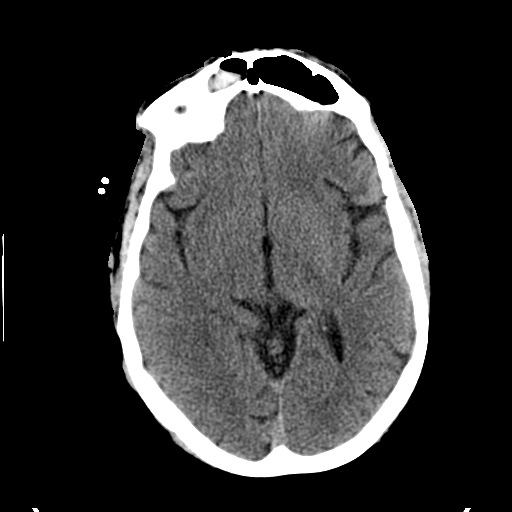
[im 19/36  brain]
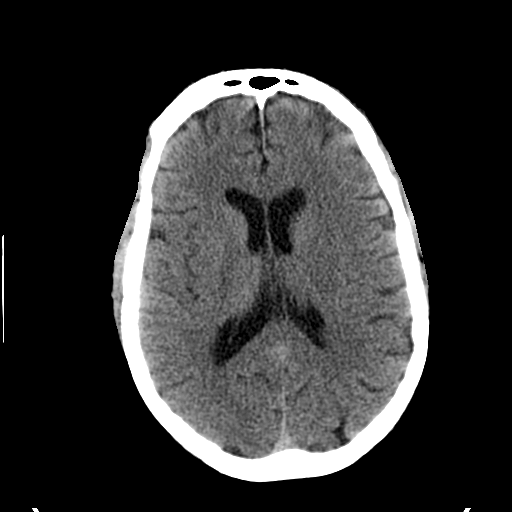
[im 20/36  brain]
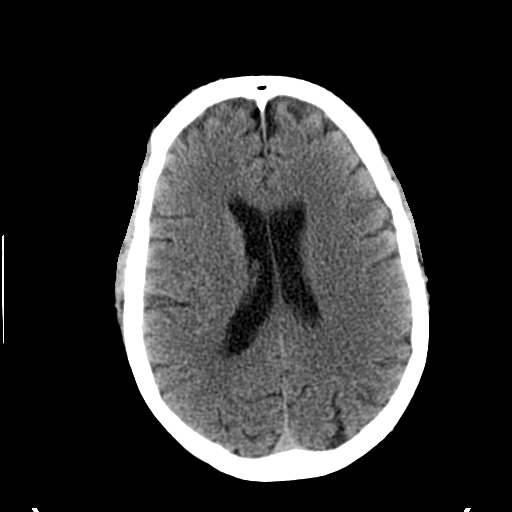
[im 20/36  bone]
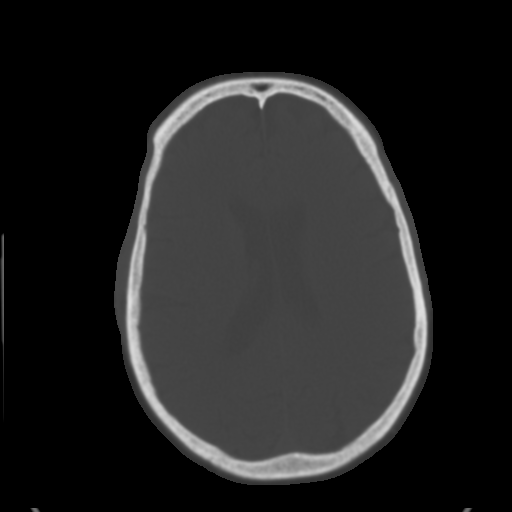
[im 22/36  brain]
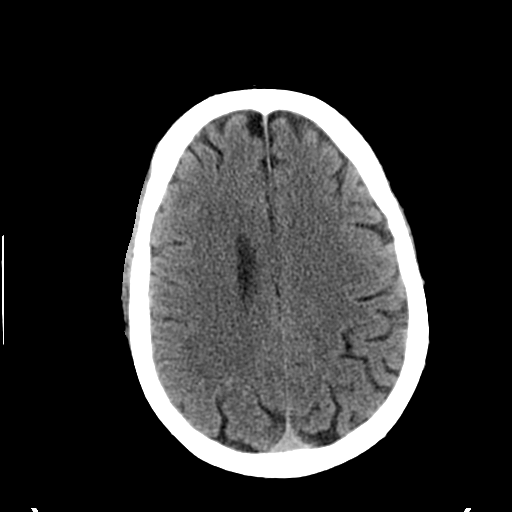
[im 25/36  brain]
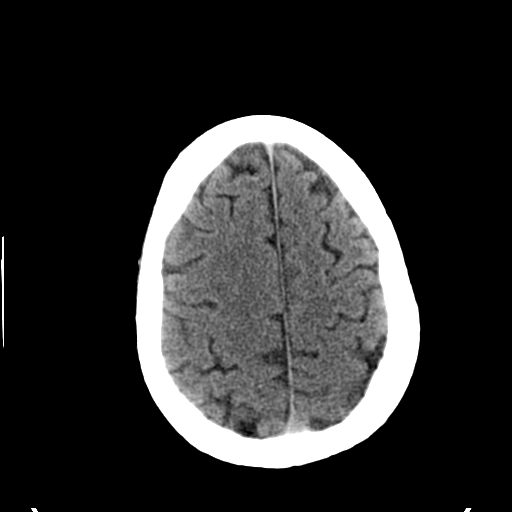
[im 27/36  brain]
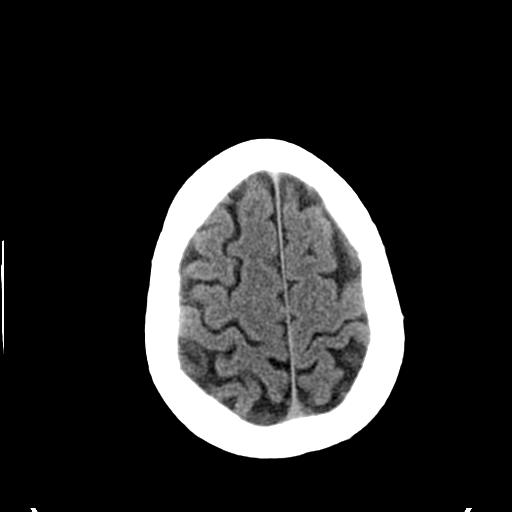
[im 29/36  brain]
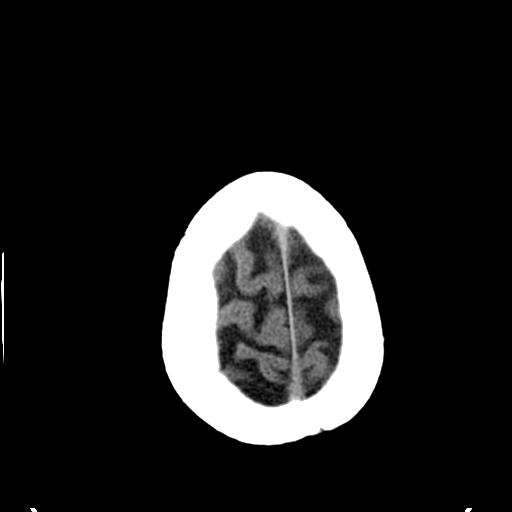
[im 29/36  bone]
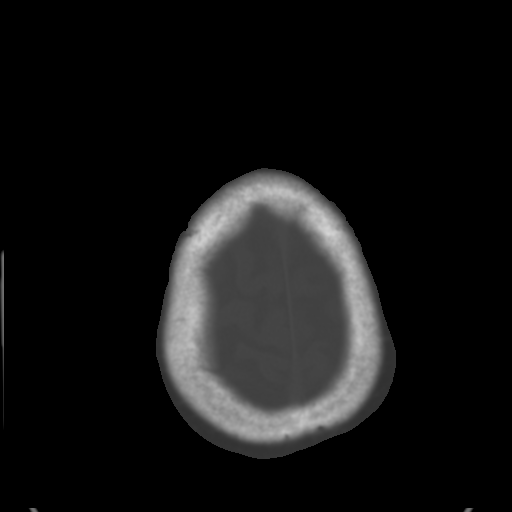
[im 32/36  brain]
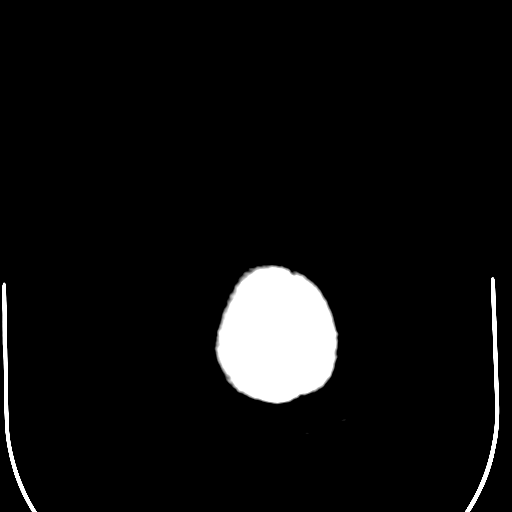
[im 34/36  brain]
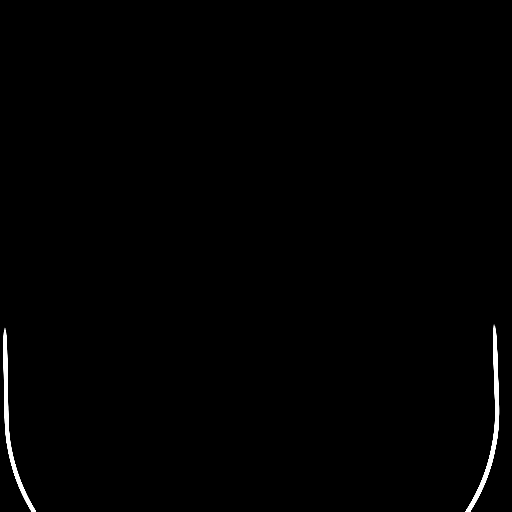

[15 of 30 positions shown; findings below may reference images not displayed]

FINDINGS: The brainstem, cerebellum, cerebral peduncles, thalamus, basal
ganglia, basilar cisterns, and ventricular system appear within
normal limits. No intracranial hemorrhage, mass lesion, or acute
CVA.

Opacification of multiple ethmoid air cells and partial
opacification of others. Chronic right frontal sinusitis. Mild
chronic left sphenoid sinusitis. Right mastoid effusion with fluid
or some type of tissue medially in the right middle ear.

Arthropathy of the left temporomandibular joint.
IMPRESSION: 1. No acute intracranial findings.
2. Right mastoid effusion with fluid or soft tissue density medially
in the right middle ear.
3. Chronic right frontal, left sphenoid, and bilateral ethmoid
sinusitis. Some of the ethmoid air cells are completely opacified.

## 2017-07-30 IMAGING — CR DG CHEST 1V PORT
1 series · 1 of 1 positions shown · non-contrast
Comparison: [facility] [HOSPITAL] portable chest radiograph
10/26/2014 and earlier

CLINICAL DATA: 49-year-old male with fever and weakness. Dialysis
patient. Initial encounter.

EXAM:
PORTABLE CHEST - 1 VIEW

[ap portable]
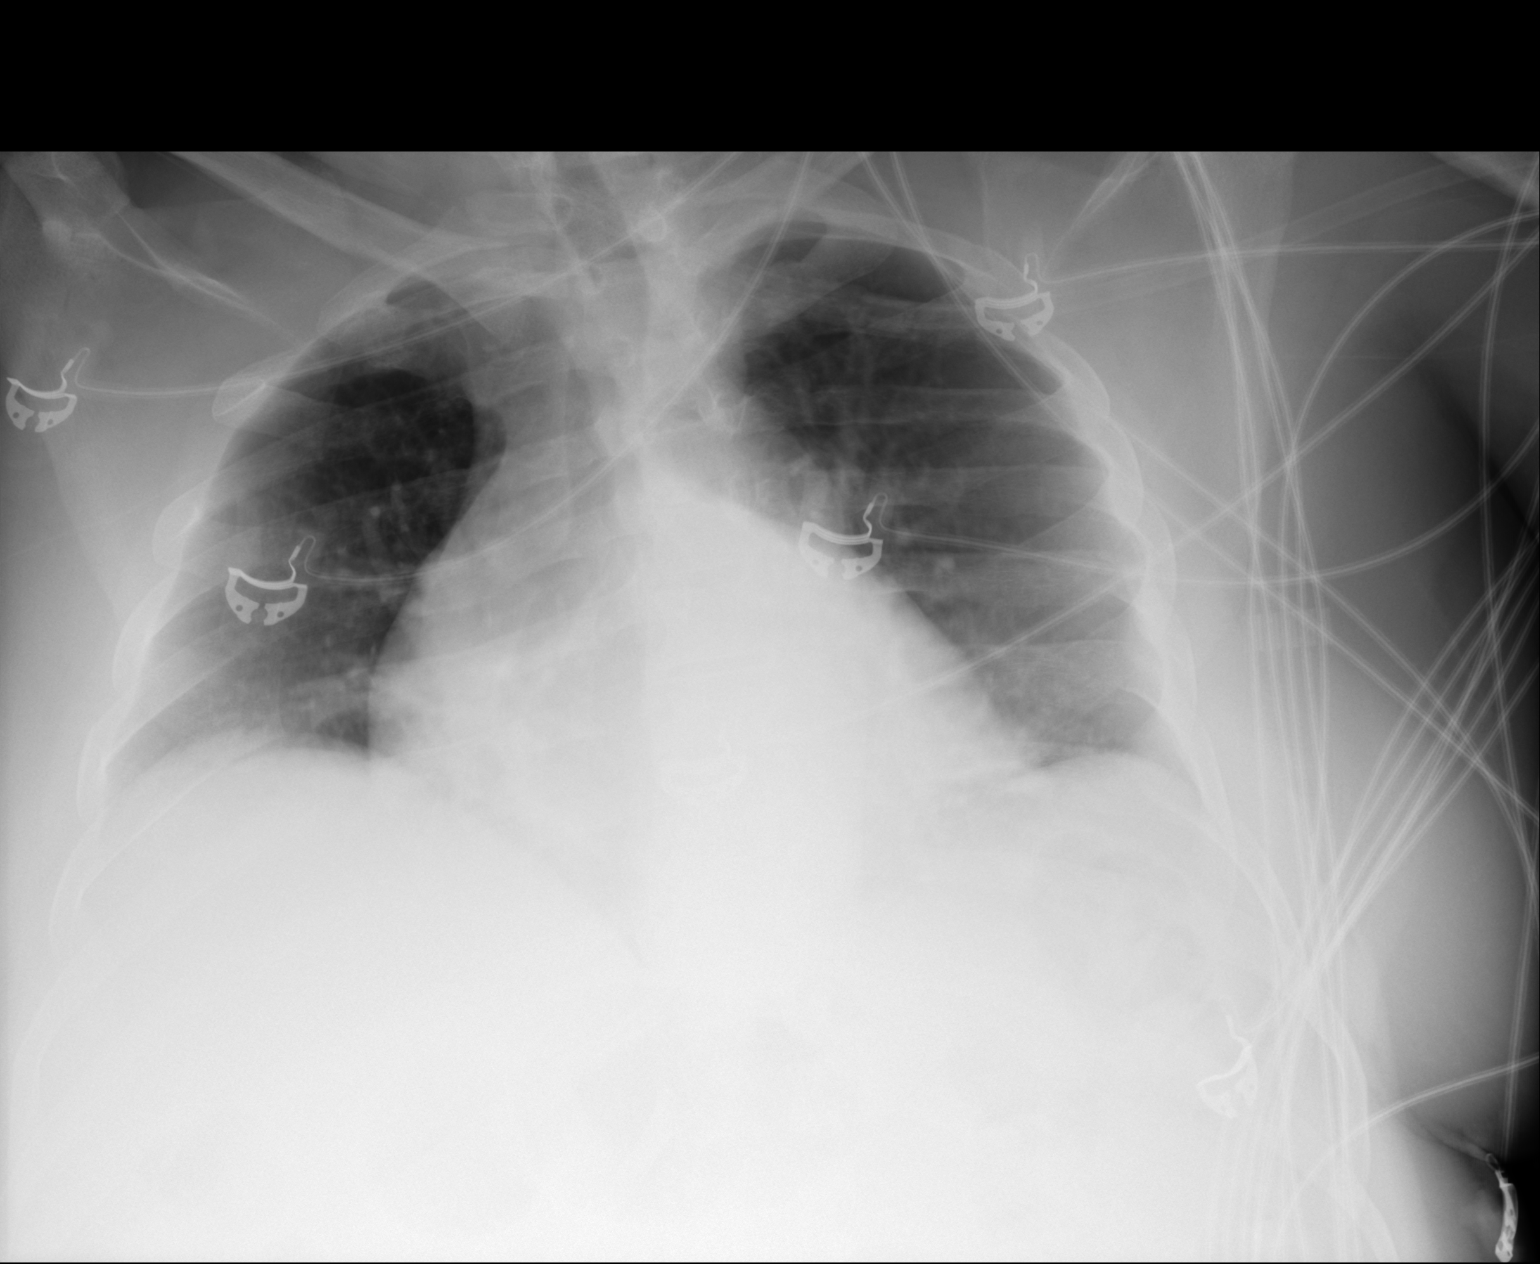

[1 of 1 positions shown; findings below may reference images not displayed]

FINDINGS: Portable AP upright view at 9628 hrs. Lower lung volumes and mild
respiratory motion. Extent to a shin of cardiac size. Other
mediastinal contours appear stable. Resolved basilar predominant
interstitial opacity compared to 2384. Allowing for portable
technique, the lungs are clear.
IMPRESSION: Low lung volumes, otherwise no acute cardiopulmonary abnormality.

## 2017-08-01 DIAGNOSIS — D509 Iron deficiency anemia, unspecified: Secondary | ICD-10-CM | POA: Diagnosis not present

## 2017-08-01 DIAGNOSIS — N186 End stage renal disease: Secondary | ICD-10-CM | POA: Diagnosis not present

## 2017-08-01 DIAGNOSIS — N2581 Secondary hyperparathyroidism of renal origin: Secondary | ICD-10-CM | POA: Diagnosis not present

## 2017-08-01 DIAGNOSIS — Z992 Dependence on renal dialysis: Secondary | ICD-10-CM | POA: Diagnosis not present

## 2017-08-04 DIAGNOSIS — N186 End stage renal disease: Secondary | ICD-10-CM | POA: Diagnosis not present

## 2017-08-04 DIAGNOSIS — Z992 Dependence on renal dialysis: Secondary | ICD-10-CM | POA: Diagnosis not present

## 2017-08-05 DIAGNOSIS — N2581 Secondary hyperparathyroidism of renal origin: Secondary | ICD-10-CM | POA: Diagnosis not present

## 2017-08-05 DIAGNOSIS — Z992 Dependence on renal dialysis: Secondary | ICD-10-CM | POA: Diagnosis not present

## 2017-08-05 DIAGNOSIS — D509 Iron deficiency anemia, unspecified: Secondary | ICD-10-CM | POA: Diagnosis not present

## 2017-08-05 DIAGNOSIS — N186 End stage renal disease: Secondary | ICD-10-CM | POA: Diagnosis not present

## 2017-08-08 DIAGNOSIS — N2581 Secondary hyperparathyroidism of renal origin: Secondary | ICD-10-CM | POA: Diagnosis not present

## 2017-08-08 DIAGNOSIS — D509 Iron deficiency anemia, unspecified: Secondary | ICD-10-CM | POA: Diagnosis not present

## 2017-08-08 DIAGNOSIS — Z992 Dependence on renal dialysis: Secondary | ICD-10-CM | POA: Diagnosis not present

## 2017-08-08 DIAGNOSIS — N186 End stage renal disease: Secondary | ICD-10-CM | POA: Diagnosis not present

## 2017-08-10 DIAGNOSIS — N186 End stage renal disease: Secondary | ICD-10-CM | POA: Diagnosis not present

## 2017-08-10 DIAGNOSIS — N2581 Secondary hyperparathyroidism of renal origin: Secondary | ICD-10-CM | POA: Diagnosis not present

## 2017-08-10 DIAGNOSIS — D509 Iron deficiency anemia, unspecified: Secondary | ICD-10-CM | POA: Diagnosis not present

## 2017-08-10 DIAGNOSIS — Z992 Dependence on renal dialysis: Secondary | ICD-10-CM | POA: Diagnosis not present

## 2017-08-12 DIAGNOSIS — D509 Iron deficiency anemia, unspecified: Secondary | ICD-10-CM | POA: Diagnosis not present

## 2017-08-12 DIAGNOSIS — N2581 Secondary hyperparathyroidism of renal origin: Secondary | ICD-10-CM | POA: Diagnosis not present

## 2017-08-12 DIAGNOSIS — N186 End stage renal disease: Secondary | ICD-10-CM | POA: Diagnosis not present

## 2017-08-12 DIAGNOSIS — Z992 Dependence on renal dialysis: Secondary | ICD-10-CM | POA: Diagnosis not present

## 2017-08-14 IMAGING — XA IR FLUORO GUIDE CV LINE*R*
1 series · 2 of 2 positions shown · non-contrast
Comparison: none

INDICATION: 49-year-old male with history of renal disease, presents for first
catheter placement to initiate hemodialysis.

[Series 1: run · 2 of 2 slices shown]
[im 1/2]
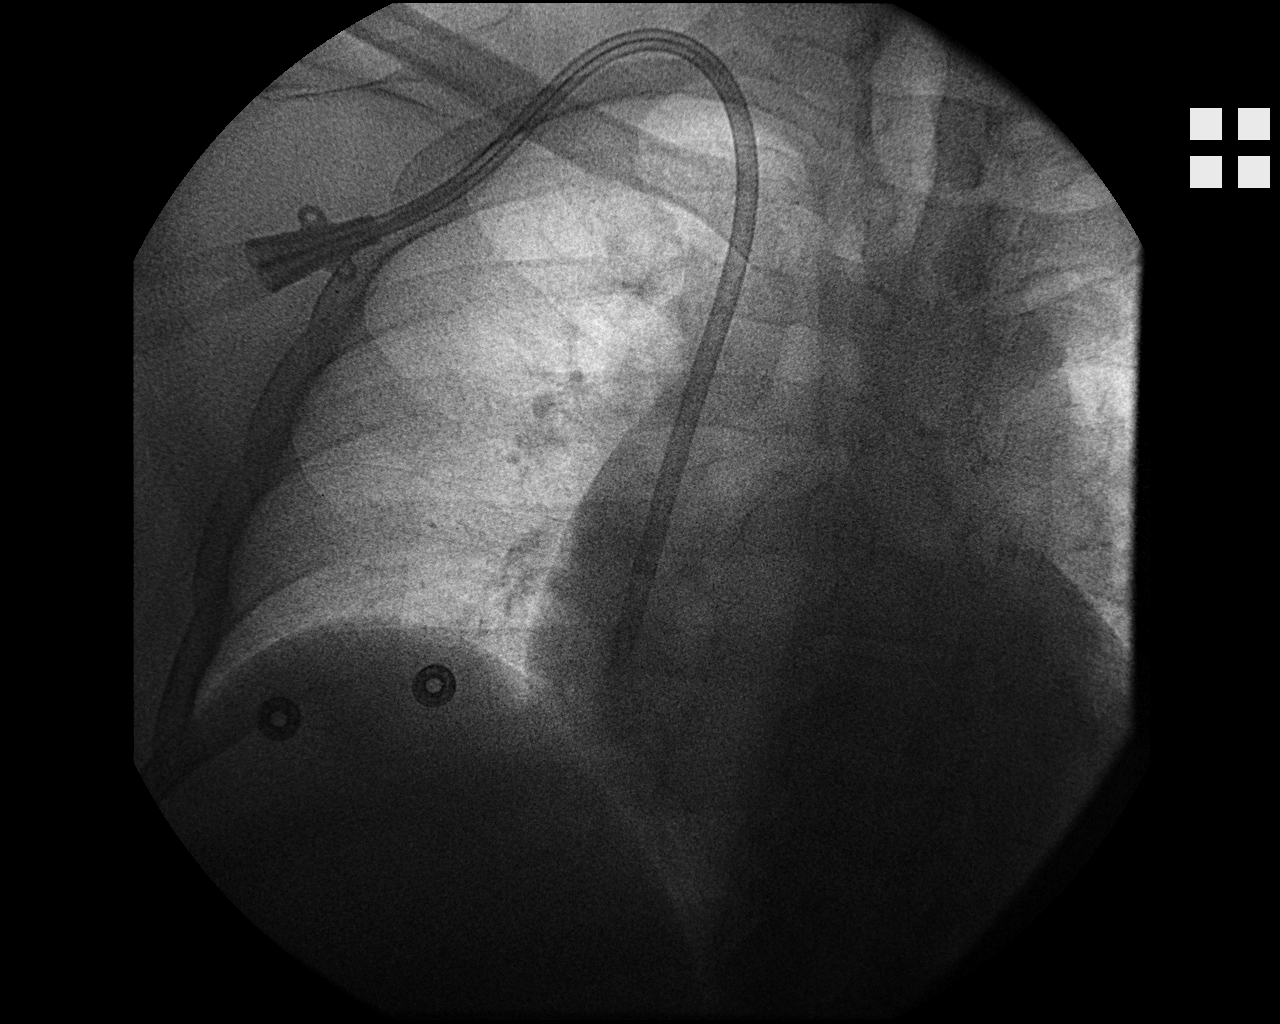
[im 2/2]
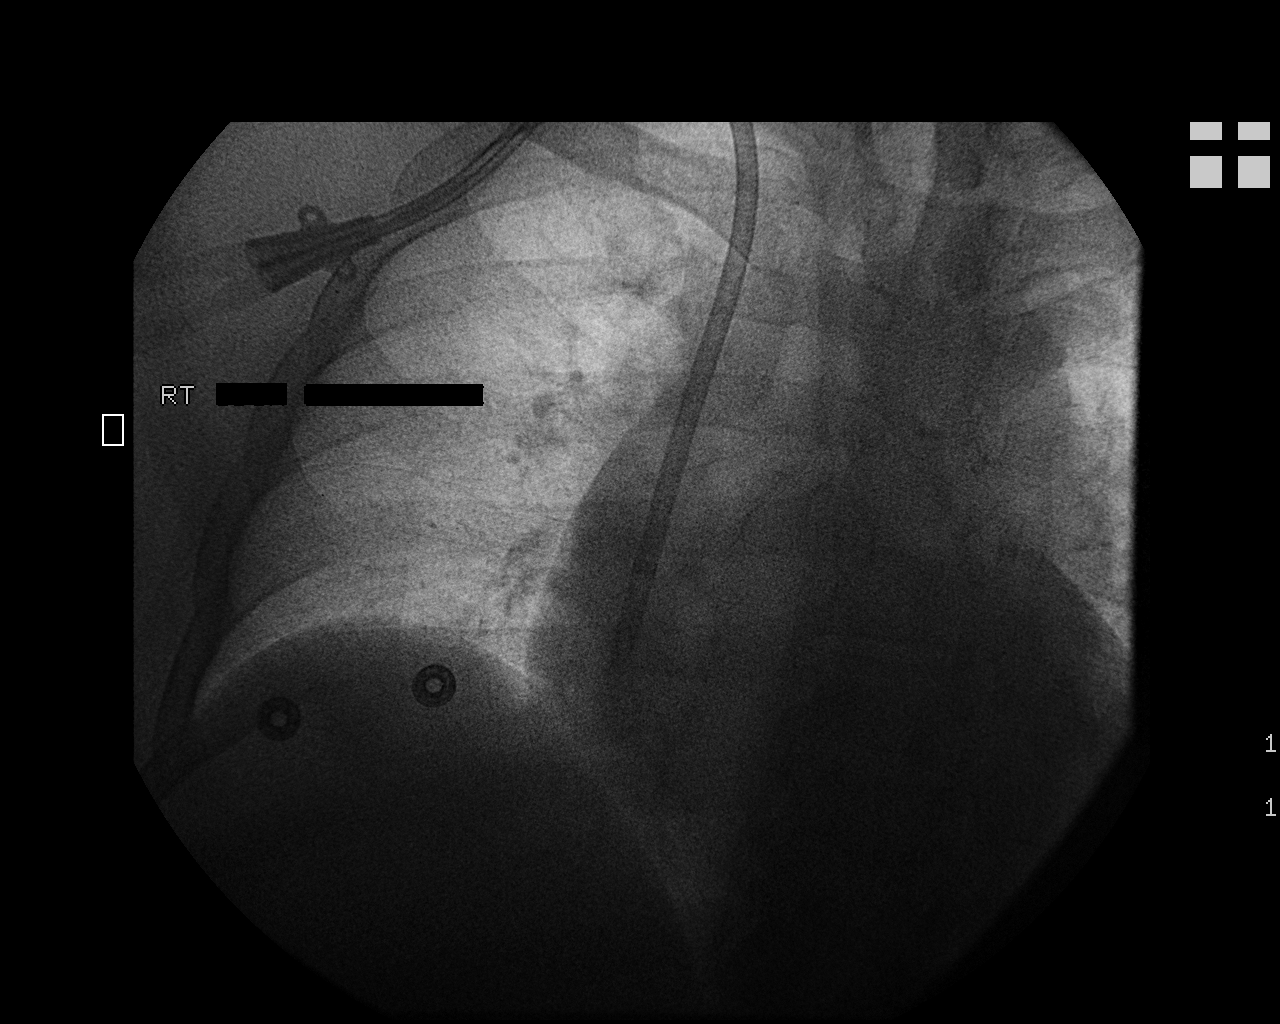

[2 of 2 positions shown; findings below may reference images not displayed]

EXAM:
TUNNELED CENTRAL VENOUS HEMODIALYSIS CATHETER PLACEMENT WITH
ULTRASOUND AND FLUOROSCOPIC GUIDANCE

MEDICATIONS:
Ancef 2 gm IV; The IV antibiotic was given in an appropriate time
interval prior to skin puncture.

CONTRAST:  None

ANESTHESIA/SEDATION:
Versed 1.0 mg IV; Fentanyl 50 mcg IV

Total Moderate Sedation Time

Eighteen minutes.

FLUOROSCOPY TIME:  Zero minutes 18 seconds.

COMPLICATIONS:
None



After creating a small venotomy incision, a micropuncture kit was
utilized to access the right internal jugular vein under direct,
real-time ultrasound guidance after the overlying soft tissues were
anesthetized with 1% lidocaine with epinephrine. Ultrasound image
documentation was performed. Wire was used to measure appropriate
catheter length. A stiff wire was advanced to the level of the IVC
and the micropuncture sheath was exchanged for a peel-away sheath. A
Palindrome tunneled hemodialysis catheter measuring 19 cm from tip
to cuff was tunneled in a retrograde fashion from the anterior chest
wall to the venotomy incision.

The catheter was then placed through the peel-away sheath with tips
ultimately positioned within the superior aspect of the right
atrium. Final catheter positioning was confirmed and documented with
a spot radiographic image. The catheter aspirates and flushes
normally. The catheter was flushed with appropriate volume heparin
dwells.

The catheter exit site was secured with a 0-Prolene retention
suture. The venotomy incision was closed Derma bond. Dressings were
applied. The patient tolerated the procedure well without immediate
post procedural complication.
IMPRESSION: Status post image guided placement of right IJ approach tunneled
hemodialysis catheter. Catheter ready for use.

## 2017-08-15 DIAGNOSIS — D509 Iron deficiency anemia, unspecified: Secondary | ICD-10-CM | POA: Diagnosis not present

## 2017-08-15 DIAGNOSIS — N186 End stage renal disease: Secondary | ICD-10-CM | POA: Diagnosis not present

## 2017-08-15 DIAGNOSIS — N2581 Secondary hyperparathyroidism of renal origin: Secondary | ICD-10-CM | POA: Diagnosis not present

## 2017-08-15 DIAGNOSIS — Z992 Dependence on renal dialysis: Secondary | ICD-10-CM | POA: Diagnosis not present

## 2017-08-17 DIAGNOSIS — N186 End stage renal disease: Secondary | ICD-10-CM | POA: Diagnosis not present

## 2017-08-17 DIAGNOSIS — Z992 Dependence on renal dialysis: Secondary | ICD-10-CM | POA: Diagnosis not present

## 2017-08-17 DIAGNOSIS — D509 Iron deficiency anemia, unspecified: Secondary | ICD-10-CM | POA: Diagnosis not present

## 2017-08-17 DIAGNOSIS — N2581 Secondary hyperparathyroidism of renal origin: Secondary | ICD-10-CM | POA: Diagnosis not present

## 2017-08-19 DIAGNOSIS — Z992 Dependence on renal dialysis: Secondary | ICD-10-CM | POA: Diagnosis not present

## 2017-08-19 DIAGNOSIS — N2581 Secondary hyperparathyroidism of renal origin: Secondary | ICD-10-CM | POA: Diagnosis not present

## 2017-08-19 DIAGNOSIS — D509 Iron deficiency anemia, unspecified: Secondary | ICD-10-CM | POA: Diagnosis not present

## 2017-08-19 DIAGNOSIS — N186 End stage renal disease: Secondary | ICD-10-CM | POA: Diagnosis not present

## 2017-08-22 DIAGNOSIS — N186 End stage renal disease: Secondary | ICD-10-CM | POA: Diagnosis not present

## 2017-08-22 DIAGNOSIS — N2581 Secondary hyperparathyroidism of renal origin: Secondary | ICD-10-CM | POA: Diagnosis not present

## 2017-08-22 DIAGNOSIS — Z992 Dependence on renal dialysis: Secondary | ICD-10-CM | POA: Diagnosis not present

## 2017-08-22 DIAGNOSIS — D509 Iron deficiency anemia, unspecified: Secondary | ICD-10-CM | POA: Diagnosis not present

## 2017-08-24 DIAGNOSIS — D509 Iron deficiency anemia, unspecified: Secondary | ICD-10-CM | POA: Diagnosis not present

## 2017-08-24 DIAGNOSIS — N2581 Secondary hyperparathyroidism of renal origin: Secondary | ICD-10-CM | POA: Diagnosis not present

## 2017-08-24 DIAGNOSIS — N186 End stage renal disease: Secondary | ICD-10-CM | POA: Diagnosis not present

## 2017-08-24 DIAGNOSIS — Z992 Dependence on renal dialysis: Secondary | ICD-10-CM | POA: Diagnosis not present

## 2017-08-25 IMAGING — CR DG CHEST 2V
1 series · 2 of 2 positions shown · non-contrast
Comparison: Portable chest x-ray August 10, 2015

CLINICAL DATA: Preoperative exam prior to removal dialysis
catheterization, history of coronary artery disease, shortness of
breath.

EXAM:
CHEST  2 VIEW

[Series 1: w chest pa · 0.14mm/px · 2 of 2 slices shown]
[im 1/2]
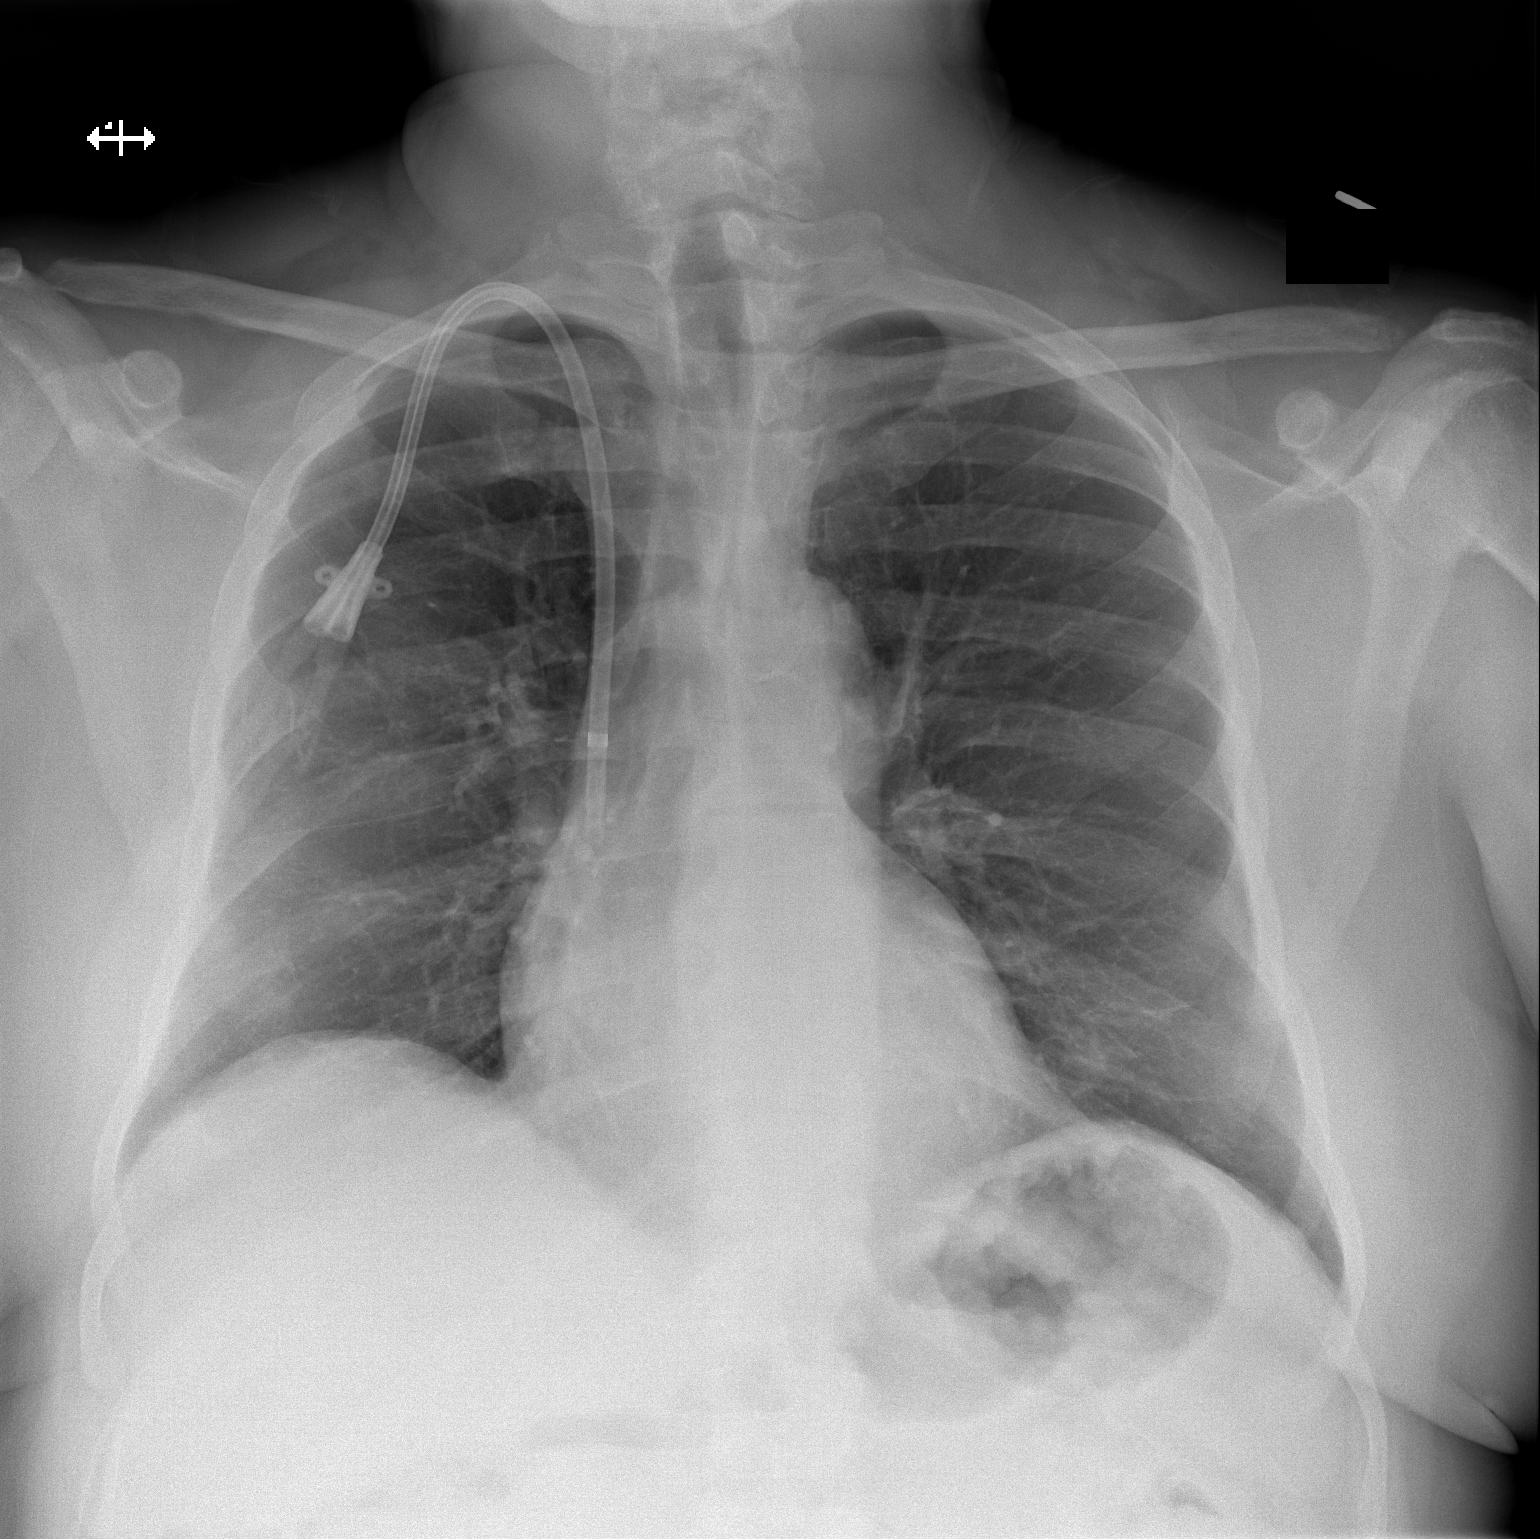
[im 2/2]
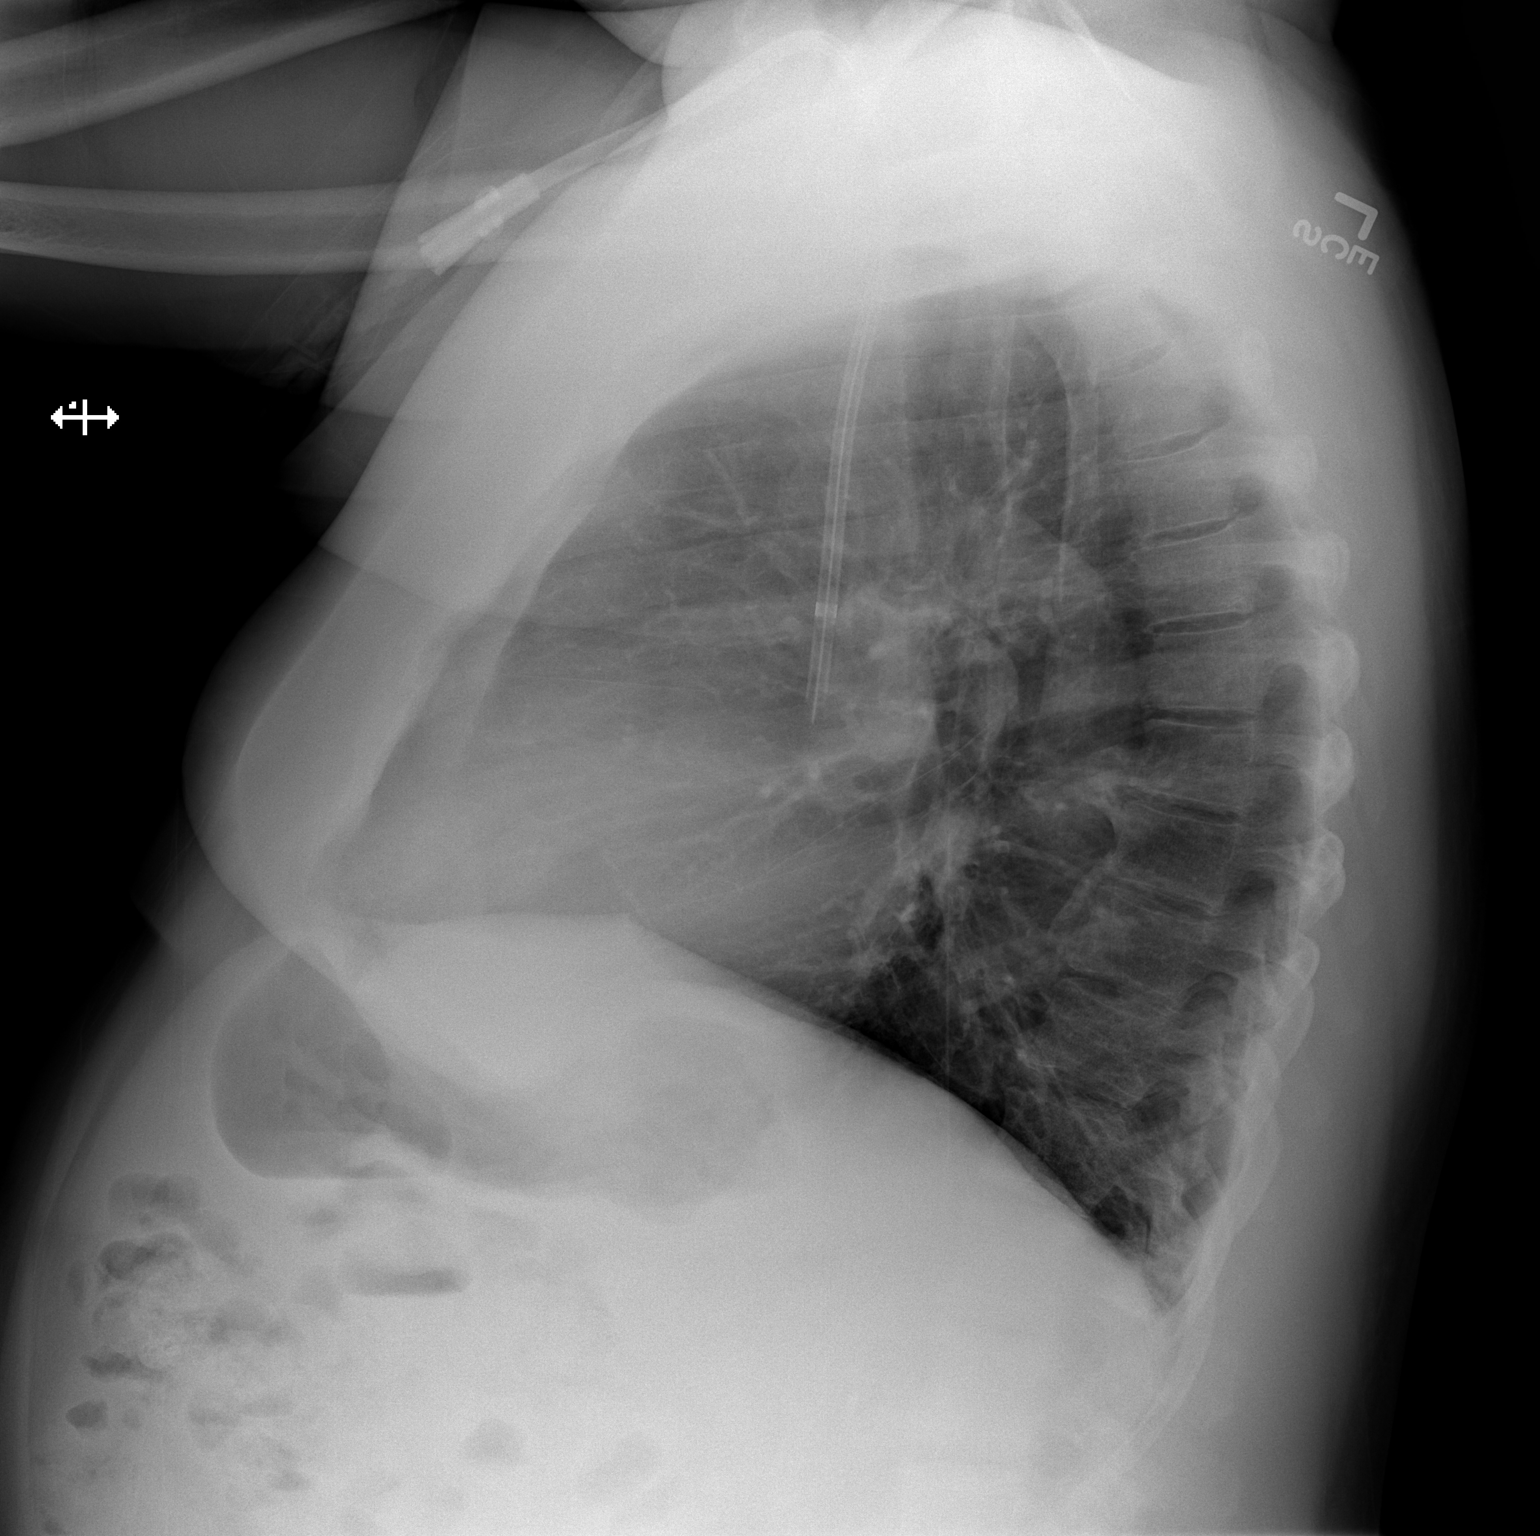

[2 of 2 positions shown; findings below may reference images not displayed]

FINDINGS: The lungs are adequately inflated. There is no focal infiltrate. The
heart and pulmonary vascularity are normal. The mediastinum is
normal in width. There is no pleural effusion. The bony thorax is
unremarkable. The dual-lumen dialysis catheter tip projects over the
midportion of the SVC.
IMPRESSION: There is no active cardiopulmonary disease.

## 2017-08-26 DIAGNOSIS — N2581 Secondary hyperparathyroidism of renal origin: Secondary | ICD-10-CM | POA: Diagnosis not present

## 2017-08-26 DIAGNOSIS — N186 End stage renal disease: Secondary | ICD-10-CM | POA: Diagnosis not present

## 2017-08-26 DIAGNOSIS — D509 Iron deficiency anemia, unspecified: Secondary | ICD-10-CM | POA: Diagnosis not present

## 2017-08-26 DIAGNOSIS — Z992 Dependence on renal dialysis: Secondary | ICD-10-CM | POA: Diagnosis not present

## 2017-08-29 DIAGNOSIS — N186 End stage renal disease: Secondary | ICD-10-CM | POA: Diagnosis not present

## 2017-08-29 DIAGNOSIS — N2581 Secondary hyperparathyroidism of renal origin: Secondary | ICD-10-CM | POA: Diagnosis not present

## 2017-08-29 DIAGNOSIS — D509 Iron deficiency anemia, unspecified: Secondary | ICD-10-CM | POA: Diagnosis not present

## 2017-08-29 DIAGNOSIS — Z992 Dependence on renal dialysis: Secondary | ICD-10-CM | POA: Diagnosis not present

## 2017-08-31 ENCOUNTER — Other Ambulatory Visit: Payer: Self-pay

## 2017-08-31 DIAGNOSIS — N2581 Secondary hyperparathyroidism of renal origin: Secondary | ICD-10-CM | POA: Diagnosis not present

## 2017-08-31 DIAGNOSIS — N186 End stage renal disease: Secondary | ICD-10-CM | POA: Diagnosis not present

## 2017-08-31 DIAGNOSIS — Z992 Dependence on renal dialysis: Secondary | ICD-10-CM | POA: Diagnosis not present

## 2017-08-31 DIAGNOSIS — Z48812 Encounter for surgical aftercare following surgery on the circulatory system: Secondary | ICD-10-CM

## 2017-09-02 DIAGNOSIS — N186 End stage renal disease: Secondary | ICD-10-CM | POA: Diagnosis not present

## 2017-09-02 DIAGNOSIS — Z992 Dependence on renal dialysis: Secondary | ICD-10-CM | POA: Diagnosis not present

## 2017-09-02 DIAGNOSIS — N2581 Secondary hyperparathyroidism of renal origin: Secondary | ICD-10-CM | POA: Diagnosis not present

## 2017-09-04 DIAGNOSIS — N186 End stage renal disease: Secondary | ICD-10-CM | POA: Diagnosis not present

## 2017-09-04 DIAGNOSIS — Z992 Dependence on renal dialysis: Secondary | ICD-10-CM | POA: Diagnosis not present

## 2017-09-05 DIAGNOSIS — Z23 Encounter for immunization: Secondary | ICD-10-CM | POA: Diagnosis not present

## 2017-09-05 DIAGNOSIS — D509 Iron deficiency anemia, unspecified: Secondary | ICD-10-CM | POA: Diagnosis not present

## 2017-09-05 DIAGNOSIS — Z992 Dependence on renal dialysis: Secondary | ICD-10-CM | POA: Diagnosis not present

## 2017-09-05 DIAGNOSIS — N2581 Secondary hyperparathyroidism of renal origin: Secondary | ICD-10-CM | POA: Diagnosis not present

## 2017-09-05 DIAGNOSIS — N186 End stage renal disease: Secondary | ICD-10-CM | POA: Diagnosis not present

## 2017-09-07 DIAGNOSIS — Z992 Dependence on renal dialysis: Secondary | ICD-10-CM | POA: Diagnosis not present

## 2017-09-07 DIAGNOSIS — N2581 Secondary hyperparathyroidism of renal origin: Secondary | ICD-10-CM | POA: Diagnosis not present

## 2017-09-07 DIAGNOSIS — N186 End stage renal disease: Secondary | ICD-10-CM | POA: Diagnosis not present

## 2017-09-07 DIAGNOSIS — Z23 Encounter for immunization: Secondary | ICD-10-CM | POA: Diagnosis not present

## 2017-09-07 DIAGNOSIS — D509 Iron deficiency anemia, unspecified: Secondary | ICD-10-CM | POA: Diagnosis not present

## 2017-09-07 NOTE — Progress Notes (Deleted)
Established Dialysis Access   History of Present Illness   Robert Hays is a 51 y.o. 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  The patient is *** hand dominant.  Previous access procedures have been completed in the left arm.  The patient's complication from previous access procedures include: sub-total occlusion of outflow.  Pt required venoplasty of right brachial vein x 2.  The patient's PMH, PSH, SH, and FamHx are unchanged from 04/14/17.  Current Outpatient Prescriptions  Medication Sig Dispense Refill  . amLODipine (NORVASC) 10 MG tablet Take 10 mg by mouth daily.    Marland Kitchen aspirin EC 81 MG tablet Take 81 mg by mouth daily.    . cholecalciferol (VITAMIN D) 1000 units tablet Take 2,000 Units by mouth daily.    . folic acid (FOLVITE) 1 MG tablet Take 1 mg by mouth daily.    Marland Kitchen gabapentin (NEURONTIN) 300 MG capsule Take 300 mg by mouth 3 (three) times daily.     . isosorbide mononitrate (IMDUR) 30 MG 24 hr tablet Take 30 mg by mouth daily.    . metoprolol tartrate (LOPRESSOR) 25 MG tablet Take 25 mg by mouth daily.    . multivitamin (RENA-VIT) TABS tablet Take 1 tablet by mouth daily.    . sevelamer carbonate (RENVELA) 800 MG tablet Take 1,600-3,200 mg by mouth 3 (three) times daily with meals. Takes 4 tablets with meals and 2 tablets with snacks    . traMADol (ULTRAM) 50 MG tablet Take 100 mg by mouth every 6 (six) hours as needed for pain.     No current facility-administered medications for this visit.     On ROS today: ***, ***   Physical Examination  ***There were no vitals filed for this visit. ***There is no height or weight on file to calculate BMI.  General {LOC:19197::"Somulent","Alert"}, {Orientation:19197::"Confused","O x 3"}, {Weight:19197::"Obese","Cachectic","WD"}, {General state of health:19197::"Ill appearing","Elderly","NAD"}  Pulmonary {Chest wall:19197::"Asx chest movement","Sym exp"}, {Air  movt:19197::"Decreased *** air movt","good B air movt"}, {BS:19197::"rales on ***","rhonchi on ***","wheezing on ***","CTA B"}  Cardiac {Rhythm:19197::"Irregularly, irregular rate and rhythm","RRR, Nl S1, S2"}, {Murmur:19197::"Murmur present: ***","no Murmurs"}, {Rubs:19197::"Rub present: ***","No rubs"}, {Gallop:19197::"Gallop present: ***","No S3,S4"}  Vascular Vessel Right Left  Radial {Palpable:19197::"Not palpable","Faintly palpable","Palpable"} {Palpable:19197::"Not palpable","Faintly palpable","Palpable"}  Brachial {Palpable:19197::"Not palpable","Faintly palpable","Palpable"} {Palpable:19197::"Not palpable","Faintly palpable","Palpable"}  Ulnar {Palpable:19197::"Not palpable","Faintly palpable","Palpable"} {Palpable:19197::"Not palpable","Faintly palpable","Palpable"}    Musculo- skeletal M/S 5/5 throughout {MS:19197::"except ***"," "}, Extremities without ischemic changes {MS:19197::"except ***"," "}  Neurologic Pain and light touch intact in extremities{CN:19197::" except for decreased sensation in ***"," "}, Motor exam as listed above     Non-invasive Vascular Imaging   {side of body:30421359} Arm Access Duplex  (***):   Diameters:  *** mm  Depth:  *** mm  PSV:  *** c/s  BUE Doppler (***):   R arm:   Brachial: {Signals:19197::"none","mono","bi","tri"}, *** mm  Radial: {Signals:19197::"none","mono","bi","tri"}, *** mm  Ulnar: {Signals:19197::"none","mono","bi","tri"}, *** mm  L arm:   Brachial: {Signals:19197::"none","mono","bi","tri"}, *** mm  Radial: {Signals:19197::"none","mono","bi","tri"}, *** mm  Ulnar: {Signals:19197::"none","mono","bi","tri"}, *** mm  BUE Vein Mapping  (***):   R arm: acceptable vein conduits include ***  L arm: acceptable vein conduits include ***   Outside Studies/Documentation   *** pages of outside documents were reviewed including: ***.   Medical Decision Making   JACOBI Hays is a 51 y.o. male who presents with  ESRD requiring hemodialysis, R RC AVF venous stenosis   ***  I had an extensive discussion with this patient in regards to the  nature of access surgery, including risk, benefits, and alternatives.    The patient is aware that the risks of access surgery include but are not limited to: bleeding, infection, steal syndrome, nerve damage, ischemic monomelic neuropathy, failure of access to mature, and possible need for additional access procedures in the future.  The patient has *** agreed to proceed with the above procedure which will be scheduled ***.   Leonides Sake, MD, FACS [facility] Office: [phone] Pager: [phone]

## 2017-09-09 ENCOUNTER — Ambulatory Visit: Payer: Medicare Other | Admitting: Vascular Surgery

## 2017-09-09 ENCOUNTER — Encounter (HOSPITAL_COMMUNITY): Payer: Medicare Other

## 2017-09-09 DIAGNOSIS — N2581 Secondary hyperparathyroidism of renal origin: Secondary | ICD-10-CM | POA: Diagnosis not present

## 2017-09-09 DIAGNOSIS — Z992 Dependence on renal dialysis: Secondary | ICD-10-CM | POA: Diagnosis not present

## 2017-09-09 DIAGNOSIS — Z23 Encounter for immunization: Secondary | ICD-10-CM | POA: Diagnosis not present

## 2017-09-09 DIAGNOSIS — N186 End stage renal disease: Secondary | ICD-10-CM | POA: Diagnosis not present

## 2017-09-09 DIAGNOSIS — D509 Iron deficiency anemia, unspecified: Secondary | ICD-10-CM | POA: Diagnosis not present

## 2017-09-12 DIAGNOSIS — N2581 Secondary hyperparathyroidism of renal origin: Secondary | ICD-10-CM | POA: Diagnosis not present

## 2017-09-12 DIAGNOSIS — D509 Iron deficiency anemia, unspecified: Secondary | ICD-10-CM | POA: Diagnosis not present

## 2017-09-12 DIAGNOSIS — N186 End stage renal disease: Secondary | ICD-10-CM | POA: Diagnosis not present

## 2017-09-12 DIAGNOSIS — Z992 Dependence on renal dialysis: Secondary | ICD-10-CM | POA: Diagnosis not present

## 2017-09-12 DIAGNOSIS — Z23 Encounter for immunization: Secondary | ICD-10-CM | POA: Diagnosis not present

## 2017-09-14 DIAGNOSIS — N186 End stage renal disease: Secondary | ICD-10-CM | POA: Diagnosis not present

## 2017-09-14 DIAGNOSIS — Z23 Encounter for immunization: Secondary | ICD-10-CM | POA: Diagnosis not present

## 2017-09-14 DIAGNOSIS — Z992 Dependence on renal dialysis: Secondary | ICD-10-CM | POA: Diagnosis not present

## 2017-09-14 DIAGNOSIS — N2581 Secondary hyperparathyroidism of renal origin: Secondary | ICD-10-CM | POA: Diagnosis not present

## 2017-09-14 DIAGNOSIS — D509 Iron deficiency anemia, unspecified: Secondary | ICD-10-CM | POA: Diagnosis not present

## 2017-09-19 DIAGNOSIS — N186 End stage renal disease: Secondary | ICD-10-CM | POA: Diagnosis not present

## 2017-09-19 DIAGNOSIS — D509 Iron deficiency anemia, unspecified: Secondary | ICD-10-CM | POA: Diagnosis not present

## 2017-09-19 DIAGNOSIS — Z23 Encounter for immunization: Secondary | ICD-10-CM | POA: Diagnosis not present

## 2017-09-19 DIAGNOSIS — N2581 Secondary hyperparathyroidism of renal origin: Secondary | ICD-10-CM | POA: Diagnosis not present

## 2017-09-19 DIAGNOSIS — Z992 Dependence on renal dialysis: Secondary | ICD-10-CM | POA: Diagnosis not present

## 2017-09-21 DIAGNOSIS — Z992 Dependence on renal dialysis: Secondary | ICD-10-CM | POA: Diagnosis not present

## 2017-09-21 DIAGNOSIS — Z23 Encounter for immunization: Secondary | ICD-10-CM | POA: Diagnosis not present

## 2017-09-21 DIAGNOSIS — N186 End stage renal disease: Secondary | ICD-10-CM | POA: Diagnosis not present

## 2017-09-21 DIAGNOSIS — D509 Iron deficiency anemia, unspecified: Secondary | ICD-10-CM | POA: Diagnosis not present

## 2017-09-21 DIAGNOSIS — N2581 Secondary hyperparathyroidism of renal origin: Secondary | ICD-10-CM | POA: Diagnosis not present

## 2017-09-23 DIAGNOSIS — Z992 Dependence on renal dialysis: Secondary | ICD-10-CM | POA: Diagnosis not present

## 2017-09-23 DIAGNOSIS — N186 End stage renal disease: Secondary | ICD-10-CM | POA: Diagnosis not present

## 2017-09-23 DIAGNOSIS — D509 Iron deficiency anemia, unspecified: Secondary | ICD-10-CM | POA: Diagnosis not present

## 2017-09-23 DIAGNOSIS — N2581 Secondary hyperparathyroidism of renal origin: Secondary | ICD-10-CM | POA: Diagnosis not present

## 2017-09-23 DIAGNOSIS — Z23 Encounter for immunization: Secondary | ICD-10-CM | POA: Diagnosis not present

## 2017-09-26 DIAGNOSIS — Z992 Dependence on renal dialysis: Secondary | ICD-10-CM | POA: Diagnosis not present

## 2017-09-26 DIAGNOSIS — N2581 Secondary hyperparathyroidism of renal origin: Secondary | ICD-10-CM | POA: Diagnosis not present

## 2017-09-26 DIAGNOSIS — Z23 Encounter for immunization: Secondary | ICD-10-CM | POA: Diagnosis not present

## 2017-09-26 DIAGNOSIS — N186 End stage renal disease: Secondary | ICD-10-CM | POA: Diagnosis not present

## 2017-09-26 DIAGNOSIS — D509 Iron deficiency anemia, unspecified: Secondary | ICD-10-CM | POA: Diagnosis not present

## 2017-09-28 DIAGNOSIS — Z23 Encounter for immunization: Secondary | ICD-10-CM | POA: Diagnosis not present

## 2017-09-28 DIAGNOSIS — N2581 Secondary hyperparathyroidism of renal origin: Secondary | ICD-10-CM | POA: Diagnosis not present

## 2017-09-28 DIAGNOSIS — N186 End stage renal disease: Secondary | ICD-10-CM | POA: Diagnosis not present

## 2017-09-28 DIAGNOSIS — D509 Iron deficiency anemia, unspecified: Secondary | ICD-10-CM | POA: Diagnosis not present

## 2017-09-28 DIAGNOSIS — Z992 Dependence on renal dialysis: Secondary | ICD-10-CM | POA: Diagnosis not present

## 2017-09-30 DIAGNOSIS — N2581 Secondary hyperparathyroidism of renal origin: Secondary | ICD-10-CM | POA: Diagnosis not present

## 2017-09-30 DIAGNOSIS — D509 Iron deficiency anemia, unspecified: Secondary | ICD-10-CM | POA: Diagnosis not present

## 2017-09-30 DIAGNOSIS — N186 End stage renal disease: Secondary | ICD-10-CM | POA: Diagnosis not present

## 2017-09-30 DIAGNOSIS — Z23 Encounter for immunization: Secondary | ICD-10-CM | POA: Diagnosis not present

## 2017-09-30 DIAGNOSIS — Z992 Dependence on renal dialysis: Secondary | ICD-10-CM | POA: Diagnosis not present

## 2017-10-03 DIAGNOSIS — D509 Iron deficiency anemia, unspecified: Secondary | ICD-10-CM | POA: Diagnosis not present

## 2017-10-03 DIAGNOSIS — N2581 Secondary hyperparathyroidism of renal origin: Secondary | ICD-10-CM | POA: Diagnosis not present

## 2017-10-03 DIAGNOSIS — N186 End stage renal disease: Secondary | ICD-10-CM | POA: Diagnosis not present

## 2017-10-03 DIAGNOSIS — Z992 Dependence on renal dialysis: Secondary | ICD-10-CM | POA: Diagnosis not present

## 2017-10-03 DIAGNOSIS — Z23 Encounter for immunization: Secondary | ICD-10-CM | POA: Diagnosis not present

## 2017-10-04 DIAGNOSIS — N186 End stage renal disease: Secondary | ICD-10-CM | POA: Diagnosis not present

## 2017-10-04 DIAGNOSIS — Z992 Dependence on renal dialysis: Secondary | ICD-10-CM | POA: Diagnosis not present

## 2017-10-05 DIAGNOSIS — Z992 Dependence on renal dialysis: Secondary | ICD-10-CM | POA: Diagnosis not present

## 2017-10-05 DIAGNOSIS — Z23 Encounter for immunization: Secondary | ICD-10-CM | POA: Diagnosis not present

## 2017-10-05 DIAGNOSIS — N186 End stage renal disease: Secondary | ICD-10-CM | POA: Diagnosis not present

## 2017-10-05 DIAGNOSIS — D509 Iron deficiency anemia, unspecified: Secondary | ICD-10-CM | POA: Diagnosis not present

## 2017-10-05 DIAGNOSIS — N2581 Secondary hyperparathyroidism of renal origin: Secondary | ICD-10-CM | POA: Diagnosis not present

## 2017-10-07 DIAGNOSIS — Z992 Dependence on renal dialysis: Secondary | ICD-10-CM | POA: Diagnosis not present

## 2017-10-07 DIAGNOSIS — N2581 Secondary hyperparathyroidism of renal origin: Secondary | ICD-10-CM | POA: Diagnosis not present

## 2017-10-07 DIAGNOSIS — D509 Iron deficiency anemia, unspecified: Secondary | ICD-10-CM | POA: Diagnosis not present

## 2017-10-07 DIAGNOSIS — N186 End stage renal disease: Secondary | ICD-10-CM | POA: Diagnosis not present

## 2017-10-10 DIAGNOSIS — Z992 Dependence on renal dialysis: Secondary | ICD-10-CM | POA: Diagnosis not present

## 2017-10-10 DIAGNOSIS — N186 End stage renal disease: Secondary | ICD-10-CM | POA: Diagnosis not present

## 2017-10-10 DIAGNOSIS — D509 Iron deficiency anemia, unspecified: Secondary | ICD-10-CM | POA: Diagnosis not present

## 2017-10-10 DIAGNOSIS — N2581 Secondary hyperparathyroidism of renal origin: Secondary | ICD-10-CM | POA: Diagnosis not present

## 2017-10-12 DIAGNOSIS — N186 End stage renal disease: Secondary | ICD-10-CM | POA: Diagnosis not present

## 2017-10-12 DIAGNOSIS — Z992 Dependence on renal dialysis: Secondary | ICD-10-CM | POA: Diagnosis not present

## 2017-10-12 DIAGNOSIS — D509 Iron deficiency anemia, unspecified: Secondary | ICD-10-CM | POA: Diagnosis not present

## 2017-10-12 DIAGNOSIS — N2581 Secondary hyperparathyroidism of renal origin: Secondary | ICD-10-CM | POA: Diagnosis not present

## 2017-10-14 DIAGNOSIS — D509 Iron deficiency anemia, unspecified: Secondary | ICD-10-CM | POA: Diagnosis not present

## 2017-10-14 DIAGNOSIS — N2581 Secondary hyperparathyroidism of renal origin: Secondary | ICD-10-CM | POA: Diagnosis not present

## 2017-10-14 DIAGNOSIS — Z992 Dependence on renal dialysis: Secondary | ICD-10-CM | POA: Diagnosis not present

## 2017-10-14 DIAGNOSIS — N186 End stage renal disease: Secondary | ICD-10-CM | POA: Diagnosis not present

## 2017-10-17 DIAGNOSIS — N2581 Secondary hyperparathyroidism of renal origin: Secondary | ICD-10-CM | POA: Diagnosis not present

## 2017-10-17 DIAGNOSIS — D509 Iron deficiency anemia, unspecified: Secondary | ICD-10-CM | POA: Diagnosis not present

## 2017-10-17 DIAGNOSIS — Z992 Dependence on renal dialysis: Secondary | ICD-10-CM | POA: Diagnosis not present

## 2017-10-17 DIAGNOSIS — N186 End stage renal disease: Secondary | ICD-10-CM | POA: Diagnosis not present

## 2017-10-19 DIAGNOSIS — Z992 Dependence on renal dialysis: Secondary | ICD-10-CM | POA: Diagnosis not present

## 2017-10-19 DIAGNOSIS — N186 End stage renal disease: Secondary | ICD-10-CM | POA: Diagnosis not present

## 2017-10-19 DIAGNOSIS — N2581 Secondary hyperparathyroidism of renal origin: Secondary | ICD-10-CM | POA: Diagnosis not present

## 2017-10-19 DIAGNOSIS — D509 Iron deficiency anemia, unspecified: Secondary | ICD-10-CM | POA: Diagnosis not present

## 2017-10-21 DIAGNOSIS — N2581 Secondary hyperparathyroidism of renal origin: Secondary | ICD-10-CM | POA: Diagnosis not present

## 2017-10-21 DIAGNOSIS — D509 Iron deficiency anemia, unspecified: Secondary | ICD-10-CM | POA: Diagnosis not present

## 2017-10-21 DIAGNOSIS — N186 End stage renal disease: Secondary | ICD-10-CM | POA: Diagnosis not present

## 2017-10-21 DIAGNOSIS — Z992 Dependence on renal dialysis: Secondary | ICD-10-CM | POA: Diagnosis not present

## 2017-10-24 DIAGNOSIS — D509 Iron deficiency anemia, unspecified: Secondary | ICD-10-CM | POA: Diagnosis not present

## 2017-10-24 DIAGNOSIS — N186 End stage renal disease: Secondary | ICD-10-CM | POA: Diagnosis not present

## 2017-10-24 DIAGNOSIS — Z992 Dependence on renal dialysis: Secondary | ICD-10-CM | POA: Diagnosis not present

## 2017-10-24 DIAGNOSIS — N2581 Secondary hyperparathyroidism of renal origin: Secondary | ICD-10-CM | POA: Diagnosis not present

## 2017-10-26 DIAGNOSIS — D509 Iron deficiency anemia, unspecified: Secondary | ICD-10-CM | POA: Diagnosis not present

## 2017-10-26 DIAGNOSIS — N186 End stage renal disease: Secondary | ICD-10-CM | POA: Diagnosis not present

## 2017-10-26 DIAGNOSIS — Z992 Dependence on renal dialysis: Secondary | ICD-10-CM | POA: Diagnosis not present

## 2017-10-26 DIAGNOSIS — N2581 Secondary hyperparathyroidism of renal origin: Secondary | ICD-10-CM | POA: Diagnosis not present

## 2017-10-28 DIAGNOSIS — Z992 Dependence on renal dialysis: Secondary | ICD-10-CM | POA: Diagnosis not present

## 2017-10-28 DIAGNOSIS — N186 End stage renal disease: Secondary | ICD-10-CM | POA: Diagnosis not present

## 2017-10-28 DIAGNOSIS — N2581 Secondary hyperparathyroidism of renal origin: Secondary | ICD-10-CM | POA: Diagnosis not present

## 2017-10-28 DIAGNOSIS — D509 Iron deficiency anemia, unspecified: Secondary | ICD-10-CM | POA: Diagnosis not present

## 2017-10-31 DIAGNOSIS — Z992 Dependence on renal dialysis: Secondary | ICD-10-CM | POA: Diagnosis not present

## 2017-10-31 DIAGNOSIS — D509 Iron deficiency anemia, unspecified: Secondary | ICD-10-CM | POA: Diagnosis not present

## 2017-10-31 DIAGNOSIS — N186 End stage renal disease: Secondary | ICD-10-CM | POA: Diagnosis not present

## 2017-10-31 DIAGNOSIS — N2581 Secondary hyperparathyroidism of renal origin: Secondary | ICD-10-CM | POA: Diagnosis not present

## 2017-11-02 DIAGNOSIS — N186 End stage renal disease: Secondary | ICD-10-CM | POA: Diagnosis not present

## 2017-11-02 DIAGNOSIS — N2581 Secondary hyperparathyroidism of renal origin: Secondary | ICD-10-CM | POA: Diagnosis not present

## 2017-11-02 DIAGNOSIS — D509 Iron deficiency anemia, unspecified: Secondary | ICD-10-CM | POA: Diagnosis not present

## 2017-11-02 DIAGNOSIS — Z992 Dependence on renal dialysis: Secondary | ICD-10-CM | POA: Diagnosis not present

## 2017-11-04 DIAGNOSIS — Z992 Dependence on renal dialysis: Secondary | ICD-10-CM | POA: Diagnosis not present

## 2017-11-04 DIAGNOSIS — N186 End stage renal disease: Secondary | ICD-10-CM | POA: Diagnosis not present

## 2017-11-04 DIAGNOSIS — D509 Iron deficiency anemia, unspecified: Secondary | ICD-10-CM | POA: Diagnosis not present

## 2017-11-04 DIAGNOSIS — N2581 Secondary hyperparathyroidism of renal origin: Secondary | ICD-10-CM | POA: Diagnosis not present

## 2017-11-07 DIAGNOSIS — N2581 Secondary hyperparathyroidism of renal origin: Secondary | ICD-10-CM | POA: Diagnosis not present

## 2017-11-07 DIAGNOSIS — N186 End stage renal disease: Secondary | ICD-10-CM | POA: Diagnosis not present

## 2017-11-07 DIAGNOSIS — Z992 Dependence on renal dialysis: Secondary | ICD-10-CM | POA: Diagnosis not present

## 2017-11-07 DIAGNOSIS — D509 Iron deficiency anemia, unspecified: Secondary | ICD-10-CM | POA: Diagnosis not present

## 2017-11-09 DIAGNOSIS — N186 End stage renal disease: Secondary | ICD-10-CM | POA: Diagnosis not present

## 2017-11-09 DIAGNOSIS — N2581 Secondary hyperparathyroidism of renal origin: Secondary | ICD-10-CM | POA: Diagnosis not present

## 2017-11-09 DIAGNOSIS — Z992 Dependence on renal dialysis: Secondary | ICD-10-CM | POA: Diagnosis not present

## 2017-11-09 DIAGNOSIS — D509 Iron deficiency anemia, unspecified: Secondary | ICD-10-CM | POA: Diagnosis not present

## 2017-11-11 DIAGNOSIS — N186 End stage renal disease: Secondary | ICD-10-CM | POA: Diagnosis not present

## 2017-11-11 DIAGNOSIS — D509 Iron deficiency anemia, unspecified: Secondary | ICD-10-CM | POA: Diagnosis not present

## 2017-11-11 DIAGNOSIS — Z992 Dependence on renal dialysis: Secondary | ICD-10-CM | POA: Diagnosis not present

## 2017-11-11 DIAGNOSIS — N2581 Secondary hyperparathyroidism of renal origin: Secondary | ICD-10-CM | POA: Diagnosis not present

## 2017-11-14 DIAGNOSIS — N186 End stage renal disease: Secondary | ICD-10-CM | POA: Diagnosis not present

## 2017-11-14 DIAGNOSIS — D509 Iron deficiency anemia, unspecified: Secondary | ICD-10-CM | POA: Diagnosis not present

## 2017-11-14 DIAGNOSIS — N2581 Secondary hyperparathyroidism of renal origin: Secondary | ICD-10-CM | POA: Diagnosis not present

## 2017-11-14 DIAGNOSIS — Z992 Dependence on renal dialysis: Secondary | ICD-10-CM | POA: Diagnosis not present

## 2017-11-16 DIAGNOSIS — N2581 Secondary hyperparathyroidism of renal origin: Secondary | ICD-10-CM | POA: Diagnosis not present

## 2017-11-16 DIAGNOSIS — Z992 Dependence on renal dialysis: Secondary | ICD-10-CM | POA: Diagnosis not present

## 2017-11-16 DIAGNOSIS — D509 Iron deficiency anemia, unspecified: Secondary | ICD-10-CM | POA: Diagnosis not present

## 2017-11-16 DIAGNOSIS — N186 End stage renal disease: Secondary | ICD-10-CM | POA: Diagnosis not present

## 2017-11-17 ENCOUNTER — Other Ambulatory Visit: Payer: Self-pay

## 2017-11-17 DIAGNOSIS — T82510A Breakdown (mechanical) of surgically created arteriovenous fistula, initial encounter: Secondary | ICD-10-CM

## 2017-11-18 DIAGNOSIS — D509 Iron deficiency anemia, unspecified: Secondary | ICD-10-CM | POA: Diagnosis not present

## 2017-11-18 DIAGNOSIS — N2581 Secondary hyperparathyroidism of renal origin: Secondary | ICD-10-CM | POA: Diagnosis not present

## 2017-11-18 DIAGNOSIS — N186 End stage renal disease: Secondary | ICD-10-CM | POA: Diagnosis not present

## 2017-11-18 DIAGNOSIS — Z992 Dependence on renal dialysis: Secondary | ICD-10-CM | POA: Diagnosis not present

## 2017-11-21 DIAGNOSIS — N2581 Secondary hyperparathyroidism of renal origin: Secondary | ICD-10-CM | POA: Diagnosis not present

## 2017-11-21 DIAGNOSIS — N186 End stage renal disease: Secondary | ICD-10-CM | POA: Diagnosis not present

## 2017-11-21 DIAGNOSIS — Z992 Dependence on renal dialysis: Secondary | ICD-10-CM | POA: Diagnosis not present

## 2017-11-21 DIAGNOSIS — D509 Iron deficiency anemia, unspecified: Secondary | ICD-10-CM | POA: Diagnosis not present

## 2017-11-23 DIAGNOSIS — N2581 Secondary hyperparathyroidism of renal origin: Secondary | ICD-10-CM | POA: Diagnosis not present

## 2017-11-23 DIAGNOSIS — Z992 Dependence on renal dialysis: Secondary | ICD-10-CM | POA: Diagnosis not present

## 2017-11-23 DIAGNOSIS — N186 End stage renal disease: Secondary | ICD-10-CM | POA: Diagnosis not present

## 2017-11-23 DIAGNOSIS — D509 Iron deficiency anemia, unspecified: Secondary | ICD-10-CM | POA: Diagnosis not present

## 2017-11-25 DIAGNOSIS — N2581 Secondary hyperparathyroidism of renal origin: Secondary | ICD-10-CM | POA: Diagnosis not present

## 2017-11-25 DIAGNOSIS — D509 Iron deficiency anemia, unspecified: Secondary | ICD-10-CM | POA: Diagnosis not present

## 2017-11-25 DIAGNOSIS — Z992 Dependence on renal dialysis: Secondary | ICD-10-CM | POA: Diagnosis not present

## 2017-11-25 DIAGNOSIS — N186 End stage renal disease: Secondary | ICD-10-CM | POA: Diagnosis not present

## 2017-11-27 DIAGNOSIS — N186 End stage renal disease: Secondary | ICD-10-CM | POA: Diagnosis not present

## 2017-11-27 DIAGNOSIS — Z992 Dependence on renal dialysis: Secondary | ICD-10-CM | POA: Diagnosis not present

## 2017-11-27 DIAGNOSIS — N2581 Secondary hyperparathyroidism of renal origin: Secondary | ICD-10-CM | POA: Diagnosis not present

## 2017-11-27 DIAGNOSIS — D509 Iron deficiency anemia, unspecified: Secondary | ICD-10-CM | POA: Diagnosis not present

## 2017-11-30 DIAGNOSIS — Z992 Dependence on renal dialysis: Secondary | ICD-10-CM | POA: Diagnosis not present

## 2017-11-30 DIAGNOSIS — N186 End stage renal disease: Secondary | ICD-10-CM | POA: Diagnosis not present

## 2017-11-30 DIAGNOSIS — N2581 Secondary hyperparathyroidism of renal origin: Secondary | ICD-10-CM | POA: Diagnosis not present

## 2017-11-30 DIAGNOSIS — D509 Iron deficiency anemia, unspecified: Secondary | ICD-10-CM | POA: Diagnosis not present

## 2017-12-02 DIAGNOSIS — N186 End stage renal disease: Secondary | ICD-10-CM | POA: Diagnosis not present

## 2017-12-02 DIAGNOSIS — Z992 Dependence on renal dialysis: Secondary | ICD-10-CM | POA: Diagnosis not present

## 2017-12-02 DIAGNOSIS — D509 Iron deficiency anemia, unspecified: Secondary | ICD-10-CM | POA: Diagnosis not present

## 2017-12-02 DIAGNOSIS — N2581 Secondary hyperparathyroidism of renal origin: Secondary | ICD-10-CM | POA: Diagnosis not present

## 2017-12-04 DIAGNOSIS — N2581 Secondary hyperparathyroidism of renal origin: Secondary | ICD-10-CM | POA: Diagnosis not present

## 2017-12-04 DIAGNOSIS — Z992 Dependence on renal dialysis: Secondary | ICD-10-CM | POA: Diagnosis not present

## 2017-12-04 DIAGNOSIS — D509 Iron deficiency anemia, unspecified: Secondary | ICD-10-CM | POA: Diagnosis not present

## 2017-12-04 DIAGNOSIS — N186 End stage renal disease: Secondary | ICD-10-CM | POA: Diagnosis not present

## 2017-12-05 DIAGNOSIS — Z992 Dependence on renal dialysis: Secondary | ICD-10-CM | POA: Diagnosis not present

## 2017-12-05 DIAGNOSIS — N186 End stage renal disease: Secondary | ICD-10-CM | POA: Diagnosis not present

## 2017-12-07 DIAGNOSIS — N186 End stage renal disease: Secondary | ICD-10-CM | POA: Diagnosis not present

## 2017-12-07 DIAGNOSIS — D509 Iron deficiency anemia, unspecified: Secondary | ICD-10-CM | POA: Diagnosis not present

## 2017-12-07 DIAGNOSIS — N2581 Secondary hyperparathyroidism of renal origin: Secondary | ICD-10-CM | POA: Diagnosis not present

## 2017-12-07 DIAGNOSIS — Z992 Dependence on renal dialysis: Secondary | ICD-10-CM | POA: Diagnosis not present

## 2017-12-09 DIAGNOSIS — D509 Iron deficiency anemia, unspecified: Secondary | ICD-10-CM | POA: Diagnosis not present

## 2017-12-09 DIAGNOSIS — N186 End stage renal disease: Secondary | ICD-10-CM | POA: Diagnosis not present

## 2017-12-09 DIAGNOSIS — Z992 Dependence on renal dialysis: Secondary | ICD-10-CM | POA: Diagnosis not present

## 2017-12-09 DIAGNOSIS — N2581 Secondary hyperparathyroidism of renal origin: Secondary | ICD-10-CM | POA: Diagnosis not present

## 2017-12-12 DIAGNOSIS — N186 End stage renal disease: Secondary | ICD-10-CM | POA: Diagnosis not present

## 2017-12-12 DIAGNOSIS — Z992 Dependence on renal dialysis: Secondary | ICD-10-CM | POA: Diagnosis not present

## 2017-12-12 DIAGNOSIS — N2581 Secondary hyperparathyroidism of renal origin: Secondary | ICD-10-CM | POA: Diagnosis not present

## 2017-12-12 DIAGNOSIS — D509 Iron deficiency anemia, unspecified: Secondary | ICD-10-CM | POA: Diagnosis not present

## 2017-12-13 ENCOUNTER — Ambulatory Visit: Payer: Medicare Other | Admitting: Vascular Surgery

## 2017-12-13 ENCOUNTER — Encounter (HOSPITAL_COMMUNITY): Payer: Medicare Other

## 2017-12-14 DIAGNOSIS — D509 Iron deficiency anemia, unspecified: Secondary | ICD-10-CM | POA: Diagnosis not present

## 2017-12-14 DIAGNOSIS — N2581 Secondary hyperparathyroidism of renal origin: Secondary | ICD-10-CM | POA: Diagnosis not present

## 2017-12-14 DIAGNOSIS — Z992 Dependence on renal dialysis: Secondary | ICD-10-CM | POA: Diagnosis not present

## 2017-12-14 DIAGNOSIS — N186 End stage renal disease: Secondary | ICD-10-CM | POA: Diagnosis not present

## 2017-12-16 DIAGNOSIS — N2581 Secondary hyperparathyroidism of renal origin: Secondary | ICD-10-CM | POA: Diagnosis not present

## 2017-12-16 DIAGNOSIS — D509 Iron deficiency anemia, unspecified: Secondary | ICD-10-CM | POA: Diagnosis not present

## 2017-12-16 DIAGNOSIS — N186 End stage renal disease: Secondary | ICD-10-CM | POA: Diagnosis not present

## 2017-12-16 DIAGNOSIS — Z992 Dependence on renal dialysis: Secondary | ICD-10-CM | POA: Diagnosis not present

## 2017-12-19 DIAGNOSIS — N186 End stage renal disease: Secondary | ICD-10-CM | POA: Diagnosis not present

## 2017-12-19 DIAGNOSIS — N2581 Secondary hyperparathyroidism of renal origin: Secondary | ICD-10-CM | POA: Diagnosis not present

## 2017-12-19 DIAGNOSIS — Z992 Dependence on renal dialysis: Secondary | ICD-10-CM | POA: Diagnosis not present

## 2017-12-19 DIAGNOSIS — D509 Iron deficiency anemia, unspecified: Secondary | ICD-10-CM | POA: Diagnosis not present

## 2017-12-21 DIAGNOSIS — N186 End stage renal disease: Secondary | ICD-10-CM | POA: Diagnosis not present

## 2017-12-21 DIAGNOSIS — Z992 Dependence on renal dialysis: Secondary | ICD-10-CM | POA: Diagnosis not present

## 2017-12-21 DIAGNOSIS — D509 Iron deficiency anemia, unspecified: Secondary | ICD-10-CM | POA: Diagnosis not present

## 2017-12-21 DIAGNOSIS — N2581 Secondary hyperparathyroidism of renal origin: Secondary | ICD-10-CM | POA: Diagnosis not present

## 2017-12-23 DIAGNOSIS — D509 Iron deficiency anemia, unspecified: Secondary | ICD-10-CM | POA: Diagnosis not present

## 2017-12-23 DIAGNOSIS — N2581 Secondary hyperparathyroidism of renal origin: Secondary | ICD-10-CM | POA: Diagnosis not present

## 2017-12-23 DIAGNOSIS — Z992 Dependence on renal dialysis: Secondary | ICD-10-CM | POA: Diagnosis not present

## 2017-12-23 DIAGNOSIS — N186 End stage renal disease: Secondary | ICD-10-CM | POA: Diagnosis not present

## 2017-12-26 DIAGNOSIS — D509 Iron deficiency anemia, unspecified: Secondary | ICD-10-CM | POA: Diagnosis not present

## 2017-12-26 DIAGNOSIS — N2581 Secondary hyperparathyroidism of renal origin: Secondary | ICD-10-CM | POA: Diagnosis not present

## 2017-12-26 DIAGNOSIS — Z992 Dependence on renal dialysis: Secondary | ICD-10-CM | POA: Diagnosis not present

## 2017-12-26 DIAGNOSIS — N186 End stage renal disease: Secondary | ICD-10-CM | POA: Diagnosis not present

## 2017-12-28 DIAGNOSIS — N186 End stage renal disease: Secondary | ICD-10-CM | POA: Diagnosis not present

## 2017-12-28 DIAGNOSIS — D509 Iron deficiency anemia, unspecified: Secondary | ICD-10-CM | POA: Diagnosis not present

## 2017-12-28 DIAGNOSIS — Z992 Dependence on renal dialysis: Secondary | ICD-10-CM | POA: Diagnosis not present

## 2017-12-28 DIAGNOSIS — N2581 Secondary hyperparathyroidism of renal origin: Secondary | ICD-10-CM | POA: Diagnosis not present

## 2017-12-30 DIAGNOSIS — N2581 Secondary hyperparathyroidism of renal origin: Secondary | ICD-10-CM | POA: Diagnosis not present

## 2017-12-30 DIAGNOSIS — N186 End stage renal disease: Secondary | ICD-10-CM | POA: Diagnosis not present

## 2017-12-30 DIAGNOSIS — D509 Iron deficiency anemia, unspecified: Secondary | ICD-10-CM | POA: Diagnosis not present

## 2017-12-30 DIAGNOSIS — Z992 Dependence on renal dialysis: Secondary | ICD-10-CM | POA: Diagnosis not present

## 2018-01-02 DIAGNOSIS — N2581 Secondary hyperparathyroidism of renal origin: Secondary | ICD-10-CM | POA: Diagnosis not present

## 2018-01-02 DIAGNOSIS — D509 Iron deficiency anemia, unspecified: Secondary | ICD-10-CM | POA: Diagnosis not present

## 2018-01-02 DIAGNOSIS — N186 End stage renal disease: Secondary | ICD-10-CM | POA: Diagnosis not present

## 2018-01-02 DIAGNOSIS — Z992 Dependence on renal dialysis: Secondary | ICD-10-CM | POA: Diagnosis not present

## 2018-01-04 DIAGNOSIS — Z992 Dependence on renal dialysis: Secondary | ICD-10-CM | POA: Diagnosis not present

## 2018-01-04 DIAGNOSIS — D509 Iron deficiency anemia, unspecified: Secondary | ICD-10-CM | POA: Diagnosis not present

## 2018-01-04 DIAGNOSIS — N2581 Secondary hyperparathyroidism of renal origin: Secondary | ICD-10-CM | POA: Diagnosis not present

## 2018-01-04 DIAGNOSIS — N186 End stage renal disease: Secondary | ICD-10-CM | POA: Diagnosis not present

## 2018-01-05 DIAGNOSIS — N186 End stage renal disease: Secondary | ICD-10-CM | POA: Diagnosis not present

## 2018-01-05 DIAGNOSIS — Z992 Dependence on renal dialysis: Secondary | ICD-10-CM | POA: Diagnosis not present

## 2018-01-06 DIAGNOSIS — N2581 Secondary hyperparathyroidism of renal origin: Secondary | ICD-10-CM | POA: Diagnosis not present

## 2018-01-06 DIAGNOSIS — N186 End stage renal disease: Secondary | ICD-10-CM | POA: Diagnosis not present

## 2018-01-06 DIAGNOSIS — Z992 Dependence on renal dialysis: Secondary | ICD-10-CM | POA: Diagnosis not present

## 2018-01-09 DIAGNOSIS — Z992 Dependence on renal dialysis: Secondary | ICD-10-CM | POA: Diagnosis not present

## 2018-01-09 DIAGNOSIS — N2581 Secondary hyperparathyroidism of renal origin: Secondary | ICD-10-CM | POA: Diagnosis not present

## 2018-01-09 DIAGNOSIS — N186 End stage renal disease: Secondary | ICD-10-CM | POA: Diagnosis not present

## 2018-01-11 DIAGNOSIS — Z992 Dependence on renal dialysis: Secondary | ICD-10-CM | POA: Diagnosis not present

## 2018-01-11 DIAGNOSIS — N186 End stage renal disease: Secondary | ICD-10-CM | POA: Diagnosis not present

## 2018-01-11 DIAGNOSIS — N2581 Secondary hyperparathyroidism of renal origin: Secondary | ICD-10-CM | POA: Diagnosis not present

## 2018-01-13 DIAGNOSIS — N2581 Secondary hyperparathyroidism of renal origin: Secondary | ICD-10-CM | POA: Diagnosis not present

## 2018-01-13 DIAGNOSIS — N186 End stage renal disease: Secondary | ICD-10-CM | POA: Diagnosis not present

## 2018-01-13 DIAGNOSIS — Z992 Dependence on renal dialysis: Secondary | ICD-10-CM | POA: Diagnosis not present

## 2018-01-16 DIAGNOSIS — N186 End stage renal disease: Secondary | ICD-10-CM | POA: Diagnosis not present

## 2018-01-16 DIAGNOSIS — N2581 Secondary hyperparathyroidism of renal origin: Secondary | ICD-10-CM | POA: Diagnosis not present

## 2018-01-16 DIAGNOSIS — Z992 Dependence on renal dialysis: Secondary | ICD-10-CM | POA: Diagnosis not present

## 2018-01-18 DIAGNOSIS — N186 End stage renal disease: Secondary | ICD-10-CM | POA: Diagnosis not present

## 2018-01-18 DIAGNOSIS — N2581 Secondary hyperparathyroidism of renal origin: Secondary | ICD-10-CM | POA: Diagnosis not present

## 2018-01-18 DIAGNOSIS — Z992 Dependence on renal dialysis: Secondary | ICD-10-CM | POA: Diagnosis not present

## 2018-01-20 DIAGNOSIS — Z992 Dependence on renal dialysis: Secondary | ICD-10-CM | POA: Diagnosis not present

## 2018-01-20 DIAGNOSIS — N186 End stage renal disease: Secondary | ICD-10-CM | POA: Diagnosis not present

## 2018-01-20 DIAGNOSIS — N2581 Secondary hyperparathyroidism of renal origin: Secondary | ICD-10-CM | POA: Diagnosis not present

## 2018-01-23 DIAGNOSIS — Z992 Dependence on renal dialysis: Secondary | ICD-10-CM | POA: Diagnosis not present

## 2018-01-23 DIAGNOSIS — N186 End stage renal disease: Secondary | ICD-10-CM | POA: Diagnosis not present

## 2018-01-23 DIAGNOSIS — N2581 Secondary hyperparathyroidism of renal origin: Secondary | ICD-10-CM | POA: Diagnosis not present

## 2018-01-24 ENCOUNTER — Inpatient Hospital Stay (HOSPITAL_COMMUNITY): Admission: RE | Admit: 2018-01-24 | Payer: Medicare Other | Source: Ambulatory Visit

## 2018-01-24 ENCOUNTER — Ambulatory Visit: Payer: Medicare Other | Admitting: Vascular Surgery

## 2018-01-25 DIAGNOSIS — Z992 Dependence on renal dialysis: Secondary | ICD-10-CM | POA: Diagnosis not present

## 2018-01-25 DIAGNOSIS — N2581 Secondary hyperparathyroidism of renal origin: Secondary | ICD-10-CM | POA: Diagnosis not present

## 2018-01-25 DIAGNOSIS — A64 Unspecified sexually transmitted disease: Secondary | ICD-10-CM | POA: Diagnosis not present

## 2018-01-25 DIAGNOSIS — R369 Urethral discharge, unspecified: Secondary | ICD-10-CM | POA: Diagnosis not present

## 2018-01-25 DIAGNOSIS — N186 End stage renal disease: Secondary | ICD-10-CM | POA: Diagnosis not present

## 2018-01-27 DIAGNOSIS — N186 End stage renal disease: Secondary | ICD-10-CM | POA: Diagnosis not present

## 2018-01-27 DIAGNOSIS — Z992 Dependence on renal dialysis: Secondary | ICD-10-CM | POA: Diagnosis not present

## 2018-01-27 DIAGNOSIS — N2581 Secondary hyperparathyroidism of renal origin: Secondary | ICD-10-CM | POA: Diagnosis not present

## 2018-01-30 DIAGNOSIS — N186 End stage renal disease: Secondary | ICD-10-CM | POA: Diagnosis not present

## 2018-01-30 DIAGNOSIS — N2581 Secondary hyperparathyroidism of renal origin: Secondary | ICD-10-CM | POA: Diagnosis not present

## 2018-01-30 DIAGNOSIS — Z992 Dependence on renal dialysis: Secondary | ICD-10-CM | POA: Diagnosis not present

## 2018-02-01 DIAGNOSIS — Z992 Dependence on renal dialysis: Secondary | ICD-10-CM | POA: Diagnosis not present

## 2018-02-01 DIAGNOSIS — N2581 Secondary hyperparathyroidism of renal origin: Secondary | ICD-10-CM | POA: Diagnosis not present

## 2018-02-01 DIAGNOSIS — N186 End stage renal disease: Secondary | ICD-10-CM | POA: Diagnosis not present

## 2018-02-02 DIAGNOSIS — N186 End stage renal disease: Secondary | ICD-10-CM | POA: Diagnosis not present

## 2018-02-02 DIAGNOSIS — Z992 Dependence on renal dialysis: Secondary | ICD-10-CM | POA: Diagnosis not present

## 2018-02-03 DIAGNOSIS — N186 End stage renal disease: Secondary | ICD-10-CM | POA: Diagnosis not present

## 2018-02-03 DIAGNOSIS — Z992 Dependence on renal dialysis: Secondary | ICD-10-CM | POA: Diagnosis not present

## 2018-02-03 DIAGNOSIS — D509 Iron deficiency anemia, unspecified: Secondary | ICD-10-CM | POA: Diagnosis not present

## 2018-02-03 DIAGNOSIS — N2581 Secondary hyperparathyroidism of renal origin: Secondary | ICD-10-CM | POA: Diagnosis not present

## 2018-02-06 DIAGNOSIS — D509 Iron deficiency anemia, unspecified: Secondary | ICD-10-CM | POA: Diagnosis not present

## 2018-02-06 DIAGNOSIS — N2581 Secondary hyperparathyroidism of renal origin: Secondary | ICD-10-CM | POA: Diagnosis not present

## 2018-02-06 DIAGNOSIS — Z992 Dependence on renal dialysis: Secondary | ICD-10-CM | POA: Diagnosis not present

## 2018-02-06 DIAGNOSIS — N186 End stage renal disease: Secondary | ICD-10-CM | POA: Diagnosis not present

## 2018-02-08 DIAGNOSIS — N186 End stage renal disease: Secondary | ICD-10-CM | POA: Diagnosis not present

## 2018-02-08 DIAGNOSIS — D509 Iron deficiency anemia, unspecified: Secondary | ICD-10-CM | POA: Diagnosis not present

## 2018-02-08 DIAGNOSIS — Z992 Dependence on renal dialysis: Secondary | ICD-10-CM | POA: Diagnosis not present

## 2018-02-08 DIAGNOSIS — N2581 Secondary hyperparathyroidism of renal origin: Secondary | ICD-10-CM | POA: Diagnosis not present

## 2018-02-10 DIAGNOSIS — N2581 Secondary hyperparathyroidism of renal origin: Secondary | ICD-10-CM | POA: Diagnosis not present

## 2018-02-10 DIAGNOSIS — D509 Iron deficiency anemia, unspecified: Secondary | ICD-10-CM | POA: Diagnosis not present

## 2018-02-10 DIAGNOSIS — N186 End stage renal disease: Secondary | ICD-10-CM | POA: Diagnosis not present

## 2018-02-10 DIAGNOSIS — Z992 Dependence on renal dialysis: Secondary | ICD-10-CM | POA: Diagnosis not present

## 2018-02-13 DIAGNOSIS — Z992 Dependence on renal dialysis: Secondary | ICD-10-CM | POA: Diagnosis not present

## 2018-02-13 DIAGNOSIS — N186 End stage renal disease: Secondary | ICD-10-CM | POA: Diagnosis not present

## 2018-02-13 DIAGNOSIS — D509 Iron deficiency anemia, unspecified: Secondary | ICD-10-CM | POA: Diagnosis not present

## 2018-02-13 DIAGNOSIS — N2581 Secondary hyperparathyroidism of renal origin: Secondary | ICD-10-CM | POA: Diagnosis not present

## 2018-02-15 DIAGNOSIS — Z992 Dependence on renal dialysis: Secondary | ICD-10-CM | POA: Diagnosis not present

## 2018-02-15 DIAGNOSIS — N186 End stage renal disease: Secondary | ICD-10-CM | POA: Diagnosis not present

## 2018-02-15 DIAGNOSIS — D509 Iron deficiency anemia, unspecified: Secondary | ICD-10-CM | POA: Diagnosis not present

## 2018-02-15 DIAGNOSIS — N2581 Secondary hyperparathyroidism of renal origin: Secondary | ICD-10-CM | POA: Diagnosis not present

## 2018-02-17 DIAGNOSIS — D509 Iron deficiency anemia, unspecified: Secondary | ICD-10-CM | POA: Diagnosis not present

## 2018-02-17 DIAGNOSIS — N2581 Secondary hyperparathyroidism of renal origin: Secondary | ICD-10-CM | POA: Diagnosis not present

## 2018-02-17 DIAGNOSIS — N186 End stage renal disease: Secondary | ICD-10-CM | POA: Diagnosis not present

## 2018-02-17 DIAGNOSIS — Z992 Dependence on renal dialysis: Secondary | ICD-10-CM | POA: Diagnosis not present

## 2018-02-20 DIAGNOSIS — N2581 Secondary hyperparathyroidism of renal origin: Secondary | ICD-10-CM | POA: Diagnosis not present

## 2018-02-20 DIAGNOSIS — D509 Iron deficiency anemia, unspecified: Secondary | ICD-10-CM | POA: Diagnosis not present

## 2018-02-20 DIAGNOSIS — Z992 Dependence on renal dialysis: Secondary | ICD-10-CM | POA: Diagnosis not present

## 2018-02-20 DIAGNOSIS — N186 End stage renal disease: Secondary | ICD-10-CM | POA: Diagnosis not present

## 2018-02-22 DIAGNOSIS — Z992 Dependence on renal dialysis: Secondary | ICD-10-CM | POA: Diagnosis not present

## 2018-02-22 DIAGNOSIS — D509 Iron deficiency anemia, unspecified: Secondary | ICD-10-CM | POA: Diagnosis not present

## 2018-02-22 DIAGNOSIS — N2581 Secondary hyperparathyroidism of renal origin: Secondary | ICD-10-CM | POA: Diagnosis not present

## 2018-02-22 DIAGNOSIS — N186 End stage renal disease: Secondary | ICD-10-CM | POA: Diagnosis not present

## 2018-02-24 DIAGNOSIS — Z992 Dependence on renal dialysis: Secondary | ICD-10-CM | POA: Diagnosis not present

## 2018-02-24 DIAGNOSIS — N186 End stage renal disease: Secondary | ICD-10-CM | POA: Diagnosis not present

## 2018-02-24 DIAGNOSIS — D509 Iron deficiency anemia, unspecified: Secondary | ICD-10-CM | POA: Diagnosis not present

## 2018-02-24 DIAGNOSIS — N2581 Secondary hyperparathyroidism of renal origin: Secondary | ICD-10-CM | POA: Diagnosis not present

## 2018-02-27 DIAGNOSIS — N186 End stage renal disease: Secondary | ICD-10-CM | POA: Diagnosis not present

## 2018-02-27 DIAGNOSIS — Z992 Dependence on renal dialysis: Secondary | ICD-10-CM | POA: Diagnosis not present

## 2018-02-27 DIAGNOSIS — D509 Iron deficiency anemia, unspecified: Secondary | ICD-10-CM | POA: Diagnosis not present

## 2018-02-27 DIAGNOSIS — N2581 Secondary hyperparathyroidism of renal origin: Secondary | ICD-10-CM | POA: Diagnosis not present

## 2018-03-01 DIAGNOSIS — N186 End stage renal disease: Secondary | ICD-10-CM | POA: Diagnosis not present

## 2018-03-01 DIAGNOSIS — D509 Iron deficiency anemia, unspecified: Secondary | ICD-10-CM | POA: Diagnosis not present

## 2018-03-01 DIAGNOSIS — N2581 Secondary hyperparathyroidism of renal origin: Secondary | ICD-10-CM | POA: Diagnosis not present

## 2018-03-01 DIAGNOSIS — Z992 Dependence on renal dialysis: Secondary | ICD-10-CM | POA: Diagnosis not present

## 2018-03-03 DIAGNOSIS — D509 Iron deficiency anemia, unspecified: Secondary | ICD-10-CM | POA: Diagnosis not present

## 2018-03-03 DIAGNOSIS — N186 End stage renal disease: Secondary | ICD-10-CM | POA: Diagnosis not present

## 2018-03-03 DIAGNOSIS — Z992 Dependence on renal dialysis: Secondary | ICD-10-CM | POA: Diagnosis not present

## 2018-03-03 DIAGNOSIS — N2581 Secondary hyperparathyroidism of renal origin: Secondary | ICD-10-CM | POA: Diagnosis not present

## 2018-03-06 DIAGNOSIS — N2581 Secondary hyperparathyroidism of renal origin: Secondary | ICD-10-CM | POA: Diagnosis not present

## 2018-03-06 DIAGNOSIS — Z833 Family history of diabetes mellitus: Secondary | ICD-10-CM | POA: Diagnosis not present

## 2018-03-06 DIAGNOSIS — Z8249 Family history of ischemic heart disease and other diseases of the circulatory system: Secondary | ICD-10-CM | POA: Diagnosis not present

## 2018-03-06 DIAGNOSIS — R0602 Shortness of breath: Secondary | ICD-10-CM | POA: Diagnosis not present

## 2018-03-06 DIAGNOSIS — D509 Iron deficiency anemia, unspecified: Secondary | ICD-10-CM | POA: Diagnosis not present

## 2018-03-06 DIAGNOSIS — R7989 Other specified abnormal findings of blood chemistry: Secondary | ICD-10-CM | POA: Diagnosis not present

## 2018-03-06 DIAGNOSIS — J4521 Mild intermittent asthma with (acute) exacerbation: Secondary | ICD-10-CM | POA: Diagnosis not present

## 2018-03-06 DIAGNOSIS — I120 Hypertensive chronic kidney disease with stage 5 chronic kidney disease or end stage renal disease: Secondary | ICD-10-CM | POA: Diagnosis not present

## 2018-03-06 DIAGNOSIS — J45901 Unspecified asthma with (acute) exacerbation: Secondary | ICD-10-CM | POA: Diagnosis not present

## 2018-03-06 DIAGNOSIS — R079 Chest pain, unspecified: Secondary | ICD-10-CM | POA: Diagnosis not present

## 2018-03-06 DIAGNOSIS — N186 End stage renal disease: Secondary | ICD-10-CM | POA: Diagnosis not present

## 2018-03-06 DIAGNOSIS — Z841 Family history of disorders of kidney and ureter: Secondary | ICD-10-CM | POA: Diagnosis not present

## 2018-03-06 DIAGNOSIS — R05 Cough: Secondary | ICD-10-CM | POA: Diagnosis not present

## 2018-03-06 DIAGNOSIS — Z992 Dependence on renal dialysis: Secondary | ICD-10-CM | POA: Diagnosis not present

## 2018-03-06 DIAGNOSIS — Z79899 Other long term (current) drug therapy: Secondary | ICD-10-CM | POA: Diagnosis not present

## 2018-03-06 DIAGNOSIS — Z888 Allergy status to other drugs, medicaments and biological substances status: Secondary | ICD-10-CM | POA: Diagnosis not present

## 2018-03-07 DIAGNOSIS — I120 Hypertensive chronic kidney disease with stage 5 chronic kidney disease or end stage renal disease: Secondary | ICD-10-CM | POA: Diagnosis not present

## 2018-03-07 DIAGNOSIS — R079 Chest pain, unspecified: Secondary | ICD-10-CM | POA: Diagnosis not present

## 2018-03-07 DIAGNOSIS — R7989 Other specified abnormal findings of blood chemistry: Secondary | ICD-10-CM | POA: Diagnosis not present

## 2018-03-07 DIAGNOSIS — N186 End stage renal disease: Secondary | ICD-10-CM | POA: Diagnosis not present

## 2018-03-07 DIAGNOSIS — J4521 Mild intermittent asthma with (acute) exacerbation: Secondary | ICD-10-CM | POA: Diagnosis not present

## 2018-03-08 DIAGNOSIS — Z992 Dependence on renal dialysis: Secondary | ICD-10-CM | POA: Diagnosis not present

## 2018-03-08 DIAGNOSIS — N186 End stage renal disease: Secondary | ICD-10-CM | POA: Diagnosis not present

## 2018-03-08 DIAGNOSIS — D509 Iron deficiency anemia, unspecified: Secondary | ICD-10-CM | POA: Diagnosis not present

## 2018-03-08 DIAGNOSIS — N2581 Secondary hyperparathyroidism of renal origin: Secondary | ICD-10-CM | POA: Diagnosis not present

## 2018-03-10 DIAGNOSIS — N186 End stage renal disease: Secondary | ICD-10-CM | POA: Diagnosis not present

## 2018-03-10 DIAGNOSIS — N2581 Secondary hyperparathyroidism of renal origin: Secondary | ICD-10-CM | POA: Diagnosis not present

## 2018-03-10 DIAGNOSIS — Z992 Dependence on renal dialysis: Secondary | ICD-10-CM | POA: Diagnosis not present

## 2018-03-10 DIAGNOSIS — D509 Iron deficiency anemia, unspecified: Secondary | ICD-10-CM | POA: Diagnosis not present

## 2018-03-13 DIAGNOSIS — D509 Iron deficiency anemia, unspecified: Secondary | ICD-10-CM | POA: Diagnosis not present

## 2018-03-13 DIAGNOSIS — N2581 Secondary hyperparathyroidism of renal origin: Secondary | ICD-10-CM | POA: Diagnosis not present

## 2018-03-13 DIAGNOSIS — N186 End stage renal disease: Secondary | ICD-10-CM | POA: Diagnosis not present

## 2018-03-13 DIAGNOSIS — Z992 Dependence on renal dialysis: Secondary | ICD-10-CM | POA: Diagnosis not present

## 2018-03-14 DIAGNOSIS — I10 Essential (primary) hypertension: Secondary | ICD-10-CM | POA: Diagnosis not present

## 2018-03-14 DIAGNOSIS — G603 Idiopathic progressive neuropathy: Secondary | ICD-10-CM | POA: Diagnosis not present

## 2018-03-14 DIAGNOSIS — Z79891 Long term (current) use of opiate analgesic: Secondary | ICD-10-CM | POA: Diagnosis not present

## 2018-03-14 DIAGNOSIS — G4733 Obstructive sleep apnea (adult) (pediatric): Secondary | ICD-10-CM | POA: Diagnosis not present

## 2018-03-17 DIAGNOSIS — Z992 Dependence on renal dialysis: Secondary | ICD-10-CM | POA: Diagnosis not present

## 2018-03-17 DIAGNOSIS — D509 Iron deficiency anemia, unspecified: Secondary | ICD-10-CM | POA: Diagnosis not present

## 2018-03-17 DIAGNOSIS — N186 End stage renal disease: Secondary | ICD-10-CM | POA: Diagnosis not present

## 2018-03-17 DIAGNOSIS — N2581 Secondary hyperparathyroidism of renal origin: Secondary | ICD-10-CM | POA: Diagnosis not present

## 2018-03-20 ENCOUNTER — Other Ambulatory Visit (HOSPITAL_BASED_OUTPATIENT_CLINIC_OR_DEPARTMENT_OTHER): Payer: Self-pay

## 2018-03-20 DIAGNOSIS — D509 Iron deficiency anemia, unspecified: Secondary | ICD-10-CM | POA: Diagnosis not present

## 2018-03-20 DIAGNOSIS — G4733 Obstructive sleep apnea (adult) (pediatric): Secondary | ICD-10-CM

## 2018-03-20 DIAGNOSIS — N186 End stage renal disease: Secondary | ICD-10-CM | POA: Diagnosis not present

## 2018-03-20 DIAGNOSIS — N2581 Secondary hyperparathyroidism of renal origin: Secondary | ICD-10-CM | POA: Diagnosis not present

## 2018-03-20 DIAGNOSIS — Z992 Dependence on renal dialysis: Secondary | ICD-10-CM | POA: Diagnosis not present

## 2018-03-20 DIAGNOSIS — I10 Essential (primary) hypertension: Secondary | ICD-10-CM

## 2018-03-22 DIAGNOSIS — N186 End stage renal disease: Secondary | ICD-10-CM | POA: Diagnosis not present

## 2018-03-22 DIAGNOSIS — D509 Iron deficiency anemia, unspecified: Secondary | ICD-10-CM | POA: Diagnosis not present

## 2018-03-22 DIAGNOSIS — Z992 Dependence on renal dialysis: Secondary | ICD-10-CM | POA: Diagnosis not present

## 2018-03-22 DIAGNOSIS — N2581 Secondary hyperparathyroidism of renal origin: Secondary | ICD-10-CM | POA: Diagnosis not present

## 2018-03-23 ENCOUNTER — Ambulatory Visit: Payer: Medicare Other | Attending: Neurology | Admitting: Neurology

## 2018-03-23 DIAGNOSIS — J4530 Mild persistent asthma, uncomplicated: Secondary | ICD-10-CM | POA: Diagnosis not present

## 2018-03-23 DIAGNOSIS — G4733 Obstructive sleep apnea (adult) (pediatric): Secondary | ICD-10-CM | POA: Insufficient documentation

## 2018-03-23 DIAGNOSIS — I10 Essential (primary) hypertension: Secondary | ICD-10-CM | POA: Diagnosis not present

## 2018-03-23 DIAGNOSIS — Z6834 Body mass index (BMI) 34.0-34.9, adult: Secondary | ICD-10-CM | POA: Diagnosis not present

## 2018-03-23 DIAGNOSIS — I120 Hypertensive chronic kidney disease with stage 5 chronic kidney disease or end stage renal disease: Secondary | ICD-10-CM | POA: Diagnosis not present

## 2018-03-24 DIAGNOSIS — N2581 Secondary hyperparathyroidism of renal origin: Secondary | ICD-10-CM | POA: Diagnosis not present

## 2018-03-24 DIAGNOSIS — N186 End stage renal disease: Secondary | ICD-10-CM | POA: Diagnosis not present

## 2018-03-24 DIAGNOSIS — D509 Iron deficiency anemia, unspecified: Secondary | ICD-10-CM | POA: Diagnosis not present

## 2018-03-24 DIAGNOSIS — Z992 Dependence on renal dialysis: Secondary | ICD-10-CM | POA: Diagnosis not present

## 2018-03-26 NOTE — Procedures (Signed)
[facility] Garyn Waguespack A. Gerilyn Pilgrim, MD     [email or web address]             NOCTURNAL POLYSOMNOGRAPHY   LOCATION: [facility]   Patient Name: Robert Hays, Robert Hays Date: 03/23/2018 Gender: Male D.O.B: 1966/06/27 Age (years): 73 Referring Provider: Beryle Beams MD, ABSM Height (inches): 68 Interpreting Physician: Beryle Beams MD, ABSM Weight (lbs): 230 RPSGT: Peak, Robert BMI: 35 MRN: [id number] Neck Size: 18.50 CLINICAL INFORMATION Sleep Study Type: NPSG     Indication for sleep study: N/A     Epworth Sleepiness Score: 13     SLEEP STUDY TECHNIQUE As per the AASM Manual for the Scoring of Sleep and Associated Events v2.3 (April 2016) with a hypopnea requiring 4% desaturations.  The channels recorded and monitored were frontal, central and occipital EEG, electrooculogram (EOG), submentalis EMG (chin), nasal and oral airflow, thoracic and abdominal wall motion, anterior tibialis EMG, snore microphone, electrocardiogram, and pulse oximetry.  MEDICATIONS Medications self-administered by patient taken the night of the study : N/A  Current Outpatient Medications:  .  amLODipine (NORVASC) 10 MG tablet, Take 10 mg by mouth daily., Disp: , Rfl:  .  aspirin EC 81 MG tablet, Take 81 mg by mouth daily., Disp: , Rfl:  .  cholecalciferol (VITAMIN D) 1000 units tablet, Take 2,000 Units by mouth daily., Disp: , Rfl:  .  folic acid (FOLVITE) 1 MG tablet, Take 1 mg by mouth daily., Disp: , Rfl:  .  gabapentin (NEURONTIN) 300 MG capsule, Take 300 mg by mouth 3 (three) times daily. , Disp: , Rfl:  .  isosorbide mononitrate (IMDUR) 30 MG 24 hr tablet, Take 30 mg by mouth daily., Disp: , Rfl:  .  metoprolol tartrate (LOPRESSOR) 25 MG tablet, Take 25 mg by mouth daily., Disp: , Rfl:  .  multivitamin (RENA-VIT) TABS tablet, Take 1 tablet by mouth daily., Disp: , Rfl:  .  sevelamer carbonate (RENVELA) 800 MG tablet, Take 1,600-3,200 mg by mouth 3 (three) times daily with  meals. Takes 4 tablets with meals and 2 tablets with snacks, Disp: , Rfl:  .  traMADol (ULTRAM) 50 MG tablet, Take 100 mg by mouth every 6 (six) hours as needed for pain., Disp: , Rfl:      SLEEP ARCHITECTURE The study was initiated at 10:13:38 PM and ended at 4:18:17 AM.  Sleep onset time was 3.6 minutes and the sleep efficiency was 72.1%%. The total sleep time was 263.1 minutes.  Stage REM latency was 131.0 minutes.  The patient spent 20.5%% of the night in stage N1 sleep, 71.3%% in stage N2 sleep, 0.0%% in stage N3 and 8.17% in REM.  Alpha intrusion was absent.  Supine sleep was 14.83%.  RESPIRATORY PARAMETERS The overall apnea/hypopnea index (AHI) was 100.6 per hour. There were 229 total apneas, including 217 obstructive, 8 central and 4 mixed apneas. There were 212 hypopneas and 3 RERAs.  The AHI during Stage REM sleep was 78.1 per hour.  AHI while supine was 101.5 per hour.  The mean oxygen saturation was 86.8%. The minimum SpO2 during sleep was 52.0%.  loud snoring was noted during this study.  CARDIAC DATA The 2 lead EKG demonstrated sinus rhythm. The mean heart rate was 76.4 beats per minute. Other EKG findings include: None. LEG MOVEMENT DATA The total PLMS were 0 with a resulting PLMS index of 0.0. Associated arousal with leg movement index was 0.0.  IMPRESSIONS Severe obstructive sleep apnea is documented during this recording. AutoPAP 8-14 is recommended.  Absent slow wave sleep is noted.  Argie RammingKofi A Garnetta Fedrick, MD Diplomate, American Board of Sleep Medicine. ELECTRONICALLY SIGNED ON:  03/26/2018, 11:49 PM [facility] PH: [phone]   FX: [phone] ACCREDITED BY THE AMERICAN ACADEMY OF SLEEP MEDICINE

## 2018-03-27 DIAGNOSIS — N186 End stage renal disease: Secondary | ICD-10-CM | POA: Diagnosis not present

## 2018-03-27 DIAGNOSIS — Z992 Dependence on renal dialysis: Secondary | ICD-10-CM | POA: Diagnosis not present

## 2018-03-27 DIAGNOSIS — D509 Iron deficiency anemia, unspecified: Secondary | ICD-10-CM | POA: Diagnosis not present

## 2018-03-27 DIAGNOSIS — N2581 Secondary hyperparathyroidism of renal origin: Secondary | ICD-10-CM | POA: Diagnosis not present

## 2018-03-29 DIAGNOSIS — D509 Iron deficiency anemia, unspecified: Secondary | ICD-10-CM | POA: Diagnosis not present

## 2018-03-29 DIAGNOSIS — N186 End stage renal disease: Secondary | ICD-10-CM | POA: Diagnosis not present

## 2018-03-29 DIAGNOSIS — Z992 Dependence on renal dialysis: Secondary | ICD-10-CM | POA: Diagnosis not present

## 2018-03-29 DIAGNOSIS — N2581 Secondary hyperparathyroidism of renal origin: Secondary | ICD-10-CM | POA: Diagnosis not present

## 2018-03-31 DIAGNOSIS — D509 Iron deficiency anemia, unspecified: Secondary | ICD-10-CM | POA: Diagnosis not present

## 2018-03-31 DIAGNOSIS — N2581 Secondary hyperparathyroidism of renal origin: Secondary | ICD-10-CM | POA: Diagnosis not present

## 2018-03-31 DIAGNOSIS — N186 End stage renal disease: Secondary | ICD-10-CM | POA: Diagnosis not present

## 2018-03-31 DIAGNOSIS — Z992 Dependence on renal dialysis: Secondary | ICD-10-CM | POA: Diagnosis not present

## 2018-04-03 DIAGNOSIS — N186 End stage renal disease: Secondary | ICD-10-CM | POA: Diagnosis not present

## 2018-04-03 DIAGNOSIS — N2581 Secondary hyperparathyroidism of renal origin: Secondary | ICD-10-CM | POA: Diagnosis not present

## 2018-04-03 DIAGNOSIS — D509 Iron deficiency anemia, unspecified: Secondary | ICD-10-CM | POA: Diagnosis not present

## 2018-04-03 DIAGNOSIS — Z992 Dependence on renal dialysis: Secondary | ICD-10-CM | POA: Diagnosis not present

## 2018-04-05 DIAGNOSIS — N2581 Secondary hyperparathyroidism of renal origin: Secondary | ICD-10-CM | POA: Diagnosis not present

## 2018-04-05 DIAGNOSIS — Z992 Dependence on renal dialysis: Secondary | ICD-10-CM | POA: Diagnosis not present

## 2018-04-05 DIAGNOSIS — D509 Iron deficiency anemia, unspecified: Secondary | ICD-10-CM | POA: Diagnosis not present

## 2018-04-05 DIAGNOSIS — N186 End stage renal disease: Secondary | ICD-10-CM | POA: Diagnosis not present

## 2018-04-06 DIAGNOSIS — Z6834 Body mass index (BMI) 34.0-34.9, adult: Secondary | ICD-10-CM | POA: Diagnosis not present

## 2018-04-06 DIAGNOSIS — I10 Essential (primary) hypertension: Secondary | ICD-10-CM | POA: Diagnosis not present

## 2018-04-06 DIAGNOSIS — J4531 Mild persistent asthma with (acute) exacerbation: Secondary | ICD-10-CM | POA: Diagnosis not present

## 2018-04-06 DIAGNOSIS — I120 Hypertensive chronic kidney disease with stage 5 chronic kidney disease or end stage renal disease: Secondary | ICD-10-CM | POA: Diagnosis not present

## 2018-04-07 DIAGNOSIS — N2581 Secondary hyperparathyroidism of renal origin: Secondary | ICD-10-CM | POA: Diagnosis not present

## 2018-04-07 DIAGNOSIS — D509 Iron deficiency anemia, unspecified: Secondary | ICD-10-CM | POA: Diagnosis not present

## 2018-04-07 DIAGNOSIS — N186 End stage renal disease: Secondary | ICD-10-CM | POA: Diagnosis not present

## 2018-04-07 DIAGNOSIS — Z992 Dependence on renal dialysis: Secondary | ICD-10-CM | POA: Diagnosis not present

## 2018-04-10 DIAGNOSIS — Z992 Dependence on renal dialysis: Secondary | ICD-10-CM | POA: Diagnosis not present

## 2018-04-10 DIAGNOSIS — N2581 Secondary hyperparathyroidism of renal origin: Secondary | ICD-10-CM | POA: Diagnosis not present

## 2018-04-10 DIAGNOSIS — D509 Iron deficiency anemia, unspecified: Secondary | ICD-10-CM | POA: Diagnosis not present

## 2018-04-10 DIAGNOSIS — N186 End stage renal disease: Secondary | ICD-10-CM | POA: Diagnosis not present

## 2018-04-12 DIAGNOSIS — D509 Iron deficiency anemia, unspecified: Secondary | ICD-10-CM | POA: Diagnosis not present

## 2018-04-12 DIAGNOSIS — N2581 Secondary hyperparathyroidism of renal origin: Secondary | ICD-10-CM | POA: Diagnosis not present

## 2018-04-12 DIAGNOSIS — Z992 Dependence on renal dialysis: Secondary | ICD-10-CM | POA: Diagnosis not present

## 2018-04-12 DIAGNOSIS — N186 End stage renal disease: Secondary | ICD-10-CM | POA: Diagnosis not present

## 2018-04-14 DIAGNOSIS — N2581 Secondary hyperparathyroidism of renal origin: Secondary | ICD-10-CM | POA: Diagnosis not present

## 2018-04-14 DIAGNOSIS — N186 End stage renal disease: Secondary | ICD-10-CM | POA: Diagnosis not present

## 2018-04-14 DIAGNOSIS — Z992 Dependence on renal dialysis: Secondary | ICD-10-CM | POA: Diagnosis not present

## 2018-04-14 DIAGNOSIS — D509 Iron deficiency anemia, unspecified: Secondary | ICD-10-CM | POA: Diagnosis not present

## 2018-04-17 DIAGNOSIS — Z992 Dependence on renal dialysis: Secondary | ICD-10-CM | POA: Diagnosis not present

## 2018-04-17 DIAGNOSIS — N186 End stage renal disease: Secondary | ICD-10-CM | POA: Diagnosis not present

## 2018-04-17 DIAGNOSIS — D509 Iron deficiency anemia, unspecified: Secondary | ICD-10-CM | POA: Diagnosis not present

## 2018-04-17 DIAGNOSIS — N2581 Secondary hyperparathyroidism of renal origin: Secondary | ICD-10-CM | POA: Diagnosis not present

## 2018-04-18 DIAGNOSIS — Z1389 Encounter for screening for other disorder: Secondary | ICD-10-CM | POA: Diagnosis not present

## 2018-04-18 DIAGNOSIS — Z6833 Body mass index (BMI) 33.0-33.9, adult: Secondary | ICD-10-CM | POA: Diagnosis not present

## 2018-04-18 DIAGNOSIS — R0982 Postnasal drip: Secondary | ICD-10-CM | POA: Diagnosis not present

## 2018-04-18 DIAGNOSIS — Z1331 Encounter for screening for depression: Secondary | ICD-10-CM | POA: Diagnosis not present

## 2018-04-18 DIAGNOSIS — J4541 Moderate persistent asthma with (acute) exacerbation: Secondary | ICD-10-CM | POA: Diagnosis not present

## 2018-04-18 DIAGNOSIS — R05 Cough: Secondary | ICD-10-CM | POA: Diagnosis not present

## 2018-04-19 DIAGNOSIS — N186 End stage renal disease: Secondary | ICD-10-CM | POA: Diagnosis not present

## 2018-04-19 DIAGNOSIS — Z992 Dependence on renal dialysis: Secondary | ICD-10-CM | POA: Diagnosis not present

## 2018-04-19 DIAGNOSIS — N2581 Secondary hyperparathyroidism of renal origin: Secondary | ICD-10-CM | POA: Diagnosis not present

## 2018-04-19 DIAGNOSIS — D509 Iron deficiency anemia, unspecified: Secondary | ICD-10-CM | POA: Diagnosis not present

## 2018-04-21 DIAGNOSIS — N186 End stage renal disease: Secondary | ICD-10-CM | POA: Diagnosis not present

## 2018-04-21 DIAGNOSIS — N2581 Secondary hyperparathyroidism of renal origin: Secondary | ICD-10-CM | POA: Diagnosis not present

## 2018-04-21 DIAGNOSIS — D509 Iron deficiency anemia, unspecified: Secondary | ICD-10-CM | POA: Diagnosis not present

## 2018-04-21 DIAGNOSIS — Z992 Dependence on renal dialysis: Secondary | ICD-10-CM | POA: Diagnosis not present

## 2018-04-24 DIAGNOSIS — N186 End stage renal disease: Secondary | ICD-10-CM | POA: Diagnosis not present

## 2018-04-24 DIAGNOSIS — Z992 Dependence on renal dialysis: Secondary | ICD-10-CM | POA: Diagnosis not present

## 2018-04-24 DIAGNOSIS — D509 Iron deficiency anemia, unspecified: Secondary | ICD-10-CM | POA: Diagnosis not present

## 2018-04-24 DIAGNOSIS — N2581 Secondary hyperparathyroidism of renal origin: Secondary | ICD-10-CM | POA: Diagnosis not present

## 2018-04-26 DIAGNOSIS — N2581 Secondary hyperparathyroidism of renal origin: Secondary | ICD-10-CM | POA: Diagnosis not present

## 2018-04-26 DIAGNOSIS — D509 Iron deficiency anemia, unspecified: Secondary | ICD-10-CM | POA: Diagnosis not present

## 2018-04-26 DIAGNOSIS — N186 End stage renal disease: Secondary | ICD-10-CM | POA: Diagnosis not present

## 2018-04-26 DIAGNOSIS — Z992 Dependence on renal dialysis: Secondary | ICD-10-CM | POA: Diagnosis not present

## 2018-04-28 DIAGNOSIS — D509 Iron deficiency anemia, unspecified: Secondary | ICD-10-CM | POA: Diagnosis not present

## 2018-04-28 DIAGNOSIS — N2581 Secondary hyperparathyroidism of renal origin: Secondary | ICD-10-CM | POA: Diagnosis not present

## 2018-04-28 DIAGNOSIS — Z992 Dependence on renal dialysis: Secondary | ICD-10-CM | POA: Diagnosis not present

## 2018-04-28 DIAGNOSIS — N186 End stage renal disease: Secondary | ICD-10-CM | POA: Diagnosis not present

## 2018-05-01 DIAGNOSIS — N186 End stage renal disease: Secondary | ICD-10-CM | POA: Diagnosis not present

## 2018-05-01 DIAGNOSIS — Z992 Dependence on renal dialysis: Secondary | ICD-10-CM | POA: Diagnosis not present

## 2018-05-01 DIAGNOSIS — N2581 Secondary hyperparathyroidism of renal origin: Secondary | ICD-10-CM | POA: Diagnosis not present

## 2018-05-01 DIAGNOSIS — D509 Iron deficiency anemia, unspecified: Secondary | ICD-10-CM | POA: Diagnosis not present

## 2018-05-03 DIAGNOSIS — Z992 Dependence on renal dialysis: Secondary | ICD-10-CM | POA: Diagnosis not present

## 2018-05-03 DIAGNOSIS — D509 Iron deficiency anemia, unspecified: Secondary | ICD-10-CM | POA: Diagnosis not present

## 2018-05-03 DIAGNOSIS — N2581 Secondary hyperparathyroidism of renal origin: Secondary | ICD-10-CM | POA: Diagnosis not present

## 2018-05-03 DIAGNOSIS — N186 End stage renal disease: Secondary | ICD-10-CM | POA: Diagnosis not present

## 2018-05-05 DIAGNOSIS — N2581 Secondary hyperparathyroidism of renal origin: Secondary | ICD-10-CM | POA: Diagnosis not present

## 2018-05-05 DIAGNOSIS — D509 Iron deficiency anemia, unspecified: Secondary | ICD-10-CM | POA: Diagnosis not present

## 2018-05-05 DIAGNOSIS — Z992 Dependence on renal dialysis: Secondary | ICD-10-CM | POA: Diagnosis not present

## 2018-05-05 DIAGNOSIS — N186 End stage renal disease: Secondary | ICD-10-CM | POA: Diagnosis not present

## 2018-05-07 DIAGNOSIS — G253 Myoclonus: Secondary | ICD-10-CM | POA: Diagnosis not present

## 2018-05-07 DIAGNOSIS — R918 Other nonspecific abnormal finding of lung field: Secondary | ICD-10-CM | POA: Diagnosis not present

## 2018-05-07 DIAGNOSIS — Z79899 Other long term (current) drug therapy: Secondary | ICD-10-CM | POA: Diagnosis not present

## 2018-05-07 DIAGNOSIS — J189 Pneumonia, unspecified organism: Secondary | ICD-10-CM | POA: Diagnosis not present

## 2018-05-07 DIAGNOSIS — R748 Abnormal levels of other serum enzymes: Secondary | ICD-10-CM | POA: Diagnosis present

## 2018-05-07 DIAGNOSIS — G931 Anoxic brain damage, not elsewhere classified: Secondary | ICD-10-CM | POA: Diagnosis present

## 2018-05-07 DIAGNOSIS — I259 Chronic ischemic heart disease, unspecified: Secondary | ICD-10-CM | POA: Diagnosis present

## 2018-05-07 DIAGNOSIS — J449 Chronic obstructive pulmonary disease, unspecified: Secondary | ICD-10-CM | POA: Diagnosis present

## 2018-05-07 DIAGNOSIS — E1142 Type 2 diabetes mellitus with diabetic polyneuropathy: Secondary | ICD-10-CM | POA: Diagnosis present

## 2018-05-07 DIAGNOSIS — R402312 Coma scale, best motor response, none, at arrival to emergency department: Secondary | ICD-10-CM | POA: Diagnosis present

## 2018-05-07 DIAGNOSIS — I499 Cardiac arrhythmia, unspecified: Secondary | ICD-10-CM | POA: Diagnosis not present

## 2018-05-07 DIAGNOSIS — E872 Acidosis: Secondary | ICD-10-CM | POA: Diagnosis present

## 2018-05-07 DIAGNOSIS — J9601 Acute respiratory failure with hypoxia: Secondary | ICD-10-CM | POA: Diagnosis not present

## 2018-05-07 DIAGNOSIS — I469 Cardiac arrest, cause unspecified: Secondary | ICD-10-CM | POA: Diagnosis not present

## 2018-05-07 DIAGNOSIS — J810 Acute pulmonary edema: Secondary | ICD-10-CM | POA: Diagnosis not present

## 2018-05-07 DIAGNOSIS — I120 Hypertensive chronic kidney disease with stage 5 chronic kidney disease or end stage renal disease: Secondary | ICD-10-CM | POA: Diagnosis not present

## 2018-05-07 DIAGNOSIS — E1122 Type 2 diabetes mellitus with diabetic chronic kidney disease: Secondary | ICD-10-CM | POA: Diagnosis present

## 2018-05-07 DIAGNOSIS — R402212 Coma scale, best verbal response, none, at arrival to emergency department: Secondary | ICD-10-CM | POA: Diagnosis not present

## 2018-05-07 DIAGNOSIS — R000 Tachycardia, unspecified: Secondary | ICD-10-CM | POA: Diagnosis not present

## 2018-05-07 DIAGNOSIS — S2243XA Multiple fractures of ribs, bilateral, initial encounter for closed fracture: Secondary | ICD-10-CM | POA: Diagnosis not present

## 2018-05-07 DIAGNOSIS — R404 Transient alteration of awareness: Secondary | ICD-10-CM | POA: Diagnosis not present

## 2018-05-07 DIAGNOSIS — R7989 Other specified abnormal findings of blood chemistry: Secondary | ICD-10-CM | POA: Diagnosis not present

## 2018-05-07 DIAGNOSIS — I10 Essential (primary) hypertension: Secondary | ICD-10-CM | POA: Diagnosis not present

## 2018-05-07 DIAGNOSIS — D631 Anemia in chronic kidney disease: Secondary | ICD-10-CM | POA: Diagnosis present

## 2018-05-07 DIAGNOSIS — I509 Heart failure, unspecified: Secondary | ICD-10-CM | POA: Diagnosis not present

## 2018-05-07 DIAGNOSIS — I2510 Atherosclerotic heart disease of native coronary artery without angina pectoris: Secondary | ICD-10-CM | POA: Diagnosis present

## 2018-05-07 DIAGNOSIS — R0902 Hypoxemia: Secondary | ICD-10-CM | POA: Diagnosis not present

## 2018-05-07 DIAGNOSIS — G4733 Obstructive sleep apnea (adult) (pediatric): Secondary | ICD-10-CM | POA: Diagnosis present

## 2018-05-07 DIAGNOSIS — R569 Unspecified convulsions: Secondary | ICD-10-CM | POA: Diagnosis not present

## 2018-05-07 DIAGNOSIS — E875 Hyperkalemia: Secondary | ICD-10-CM | POA: Diagnosis not present

## 2018-05-07 DIAGNOSIS — Z66 Do not resuscitate: Secondary | ICD-10-CM | POA: Diagnosis not present

## 2018-05-07 DIAGNOSIS — Z992 Dependence on renal dialysis: Secondary | ICD-10-CM | POA: Diagnosis not present

## 2018-05-07 DIAGNOSIS — R402112 Coma scale, eyes open, never, at arrival to emergency department: Secondary | ICD-10-CM | POA: Diagnosis present

## 2018-05-07 DIAGNOSIS — R0602 Shortness of breath: Secondary | ICD-10-CM | POA: Diagnosis not present

## 2018-05-07 DIAGNOSIS — I132 Hypertensive heart and chronic kidney disease with heart failure and with stage 5 chronic kidney disease, or end stage renal disease: Secondary | ICD-10-CM | POA: Diagnosis present

## 2018-05-07 DIAGNOSIS — R9431 Abnormal electrocardiogram [ECG] [EKG]: Secondary | ICD-10-CM | POA: Diagnosis not present

## 2018-05-07 DIAGNOSIS — Z515 Encounter for palliative care: Secondary | ICD-10-CM | POA: Diagnosis not present

## 2018-05-07 DIAGNOSIS — N2581 Secondary hyperparathyroidism of renal origin: Secondary | ICD-10-CM | POA: Diagnosis present

## 2018-05-07 DIAGNOSIS — J40 Bronchitis, not specified as acute or chronic: Secondary | ICD-10-CM | POA: Diagnosis present

## 2018-05-07 DIAGNOSIS — I5022 Chronic systolic (congestive) heart failure: Secondary | ICD-10-CM | POA: Diagnosis not present

## 2018-05-07 DIAGNOSIS — J690 Pneumonitis due to inhalation of food and vomit: Secondary | ICD-10-CM | POA: Diagnosis not present

## 2018-05-07 DIAGNOSIS — N186 End stage renal disease: Secondary | ICD-10-CM | POA: Diagnosis not present

## 2018-05-07 DIAGNOSIS — R509 Fever, unspecified: Secondary | ICD-10-CM | POA: Diagnosis not present

## 2018-06-05 DEATH — deceased
